# Patient Record
Sex: Female | Born: 1935 | Race: White | Hispanic: No | Smoking: Former smoker
Health system: Southern US, Community
[De-identification: ages and names within clinical notes are randomized; demographics above are authoritative.]

## PROBLEM LIST (undated history)

## (undated) DIAGNOSIS — I4891 Unspecified atrial fibrillation: Secondary | ICD-10-CM

## (undated) DIAGNOSIS — I509 Heart failure, unspecified: Secondary | ICD-10-CM

## (undated) DIAGNOSIS — E119 Type 2 diabetes mellitus without complications: Secondary | ICD-10-CM

---

## 2015-12-28 DIAGNOSIS — E1165 Type 2 diabetes mellitus with hyperglycemia: Secondary | ICD-10-CM | POA: Diagnosis not present

## 2015-12-28 DIAGNOSIS — G301 Alzheimer's disease with late onset: Secondary | ICD-10-CM | POA: Diagnosis not present

## 2015-12-28 DIAGNOSIS — K219 Gastro-esophageal reflux disease without esophagitis: Secondary | ICD-10-CM | POA: Diagnosis not present

## 2015-12-28 DIAGNOSIS — I4891 Unspecified atrial fibrillation: Secondary | ICD-10-CM | POA: Diagnosis not present

## 2016-01-29 DIAGNOSIS — I5032 Chronic diastolic (congestive) heart failure: Secondary | ICD-10-CM | POA: Diagnosis not present

## 2016-01-29 DIAGNOSIS — Z751 Person awaiting admission to adequate facility elsewhere: Secondary | ICD-10-CM | POA: Diagnosis not present

## 2016-01-29 DIAGNOSIS — Z7901 Long term (current) use of anticoagulants: Secondary | ICD-10-CM | POA: Diagnosis not present

## 2016-01-29 DIAGNOSIS — M1990 Unspecified osteoarthritis, unspecified site: Secondary | ICD-10-CM | POA: Diagnosis present

## 2016-01-29 DIAGNOSIS — I739 Peripheral vascular disease, unspecified: Secondary | ICD-10-CM | POA: Diagnosis present

## 2016-01-29 DIAGNOSIS — R069 Unspecified abnormalities of breathing: Secondary | ICD-10-CM | POA: Diagnosis not present

## 2016-01-29 DIAGNOSIS — Z7951 Long term (current) use of inhaled steroids: Secondary | ICD-10-CM | POA: Diagnosis not present

## 2016-01-29 DIAGNOSIS — I4891 Unspecified atrial fibrillation: Secondary | ICD-10-CM | POA: Diagnosis not present

## 2016-01-29 DIAGNOSIS — Z7984 Long term (current) use of oral hypoglycemic drugs: Secondary | ICD-10-CM | POA: Diagnosis not present

## 2016-01-29 DIAGNOSIS — R0602 Shortness of breath: Secondary | ICD-10-CM | POA: Diagnosis not present

## 2016-01-29 DIAGNOSIS — I110 Hypertensive heart disease with heart failure: Secondary | ICD-10-CM | POA: Diagnosis not present

## 2016-01-29 DIAGNOSIS — I480 Paroxysmal atrial fibrillation: Secondary | ICD-10-CM | POA: Diagnosis not present

## 2016-01-29 DIAGNOSIS — Z90710 Acquired absence of both cervix and uterus: Secondary | ICD-10-CM | POA: Diagnosis not present

## 2016-01-29 DIAGNOSIS — I350 Nonrheumatic aortic (valve) stenosis: Secondary | ICD-10-CM | POA: Diagnosis present

## 2016-01-29 DIAGNOSIS — N183 Chronic kidney disease, stage 3 (moderate): Secondary | ICD-10-CM | POA: Diagnosis not present

## 2016-01-29 DIAGNOSIS — I10 Essential (primary) hypertension: Secondary | ICD-10-CM | POA: Diagnosis not present

## 2016-01-29 DIAGNOSIS — I340 Nonrheumatic mitral (valve) insufficiency: Secondary | ICD-10-CM | POA: Diagnosis present

## 2016-01-29 DIAGNOSIS — E7800 Pure hypercholesterolemia, unspecified: Secondary | ICD-10-CM | POA: Diagnosis present

## 2016-01-29 DIAGNOSIS — K219 Gastro-esophageal reflux disease without esophagitis: Secondary | ICD-10-CM | POA: Diagnosis present

## 2016-01-29 DIAGNOSIS — I080 Rheumatic disorders of both mitral and aortic valves: Secondary | ICD-10-CM | POA: Diagnosis not present

## 2016-01-29 DIAGNOSIS — I482 Chronic atrial fibrillation: Secondary | ICD-10-CM | POA: Diagnosis not present

## 2016-01-29 DIAGNOSIS — F0280 Dementia in other diseases classified elsewhere without behavioral disturbance: Secondary | ICD-10-CM | POA: Diagnosis present

## 2016-01-29 DIAGNOSIS — J9602 Acute respiratory failure with hypercapnia: Secondary | ICD-10-CM | POA: Diagnosis not present

## 2016-01-29 DIAGNOSIS — G309 Alzheimer's disease, unspecified: Secondary | ICD-10-CM | POA: Diagnosis present

## 2016-01-29 DIAGNOSIS — E1165 Type 2 diabetes mellitus with hyperglycemia: Secondary | ICD-10-CM | POA: Diagnosis present

## 2016-01-29 DIAGNOSIS — J449 Chronic obstructive pulmonary disease, unspecified: Secondary | ICD-10-CM | POA: Diagnosis present

## 2016-01-29 DIAGNOSIS — I517 Cardiomegaly: Secondary | ICD-10-CM | POA: Diagnosis not present

## 2016-01-29 DIAGNOSIS — I130 Hypertensive heart and chronic kidney disease with heart failure and stage 1 through stage 4 chronic kidney disease, or unspecified chronic kidney disease: Secondary | ICD-10-CM | POA: Diagnosis present

## 2016-01-29 DIAGNOSIS — I5033 Acute on chronic diastolic (congestive) heart failure: Secondary | ICD-10-CM | POA: Diagnosis not present

## 2016-01-29 DIAGNOSIS — E1142 Type 2 diabetes mellitus with diabetic polyneuropathy: Secondary | ICD-10-CM | POA: Diagnosis not present

## 2016-01-29 DIAGNOSIS — I509 Heart failure, unspecified: Secondary | ICD-10-CM | POA: Diagnosis not present

## 2016-01-29 DIAGNOSIS — Z882 Allergy status to sulfonamides status: Secondary | ICD-10-CM | POA: Diagnosis not present

## 2016-01-29 DIAGNOSIS — Z888 Allergy status to other drugs, medicaments and biological substances status: Secondary | ICD-10-CM | POA: Diagnosis not present

## 2016-01-29 DIAGNOSIS — F320 Major depressive disorder, single episode, mild: Secondary | ICD-10-CM | POA: Diagnosis not present

## 2016-01-29 DIAGNOSIS — J9601 Acute respiratory failure with hypoxia: Secondary | ICD-10-CM | POA: Diagnosis not present

## 2016-02-05 DIAGNOSIS — Z79899 Other long term (current) drug therapy: Secondary | ICD-10-CM | POA: Diagnosis not present

## 2016-02-05 DIAGNOSIS — Z7901 Long term (current) use of anticoagulants: Secondary | ICD-10-CM | POA: Diagnosis not present

## 2016-02-05 DIAGNOSIS — I5032 Chronic diastolic (congestive) heart failure: Secondary | ICD-10-CM | POA: Diagnosis not present

## 2016-02-05 DIAGNOSIS — Z87891 Personal history of nicotine dependence: Secondary | ICD-10-CM | POA: Diagnosis not present

## 2016-02-05 DIAGNOSIS — E1142 Type 2 diabetes mellitus with diabetic polyneuropathy: Secondary | ICD-10-CM | POA: Diagnosis not present

## 2016-02-05 DIAGNOSIS — G309 Alzheimer's disease, unspecified: Secondary | ICD-10-CM | POA: Diagnosis not present

## 2016-02-05 DIAGNOSIS — F320 Major depressive disorder, single episode, mild: Secondary | ICD-10-CM | POA: Diagnosis not present

## 2016-02-05 DIAGNOSIS — F0280 Dementia in other diseases classified elsewhere without behavioral disturbance: Secondary | ICD-10-CM | POA: Diagnosis not present

## 2016-02-05 DIAGNOSIS — Z9181 History of falling: Secondary | ICD-10-CM | POA: Diagnosis not present

## 2016-02-05 DIAGNOSIS — J449 Chronic obstructive pulmonary disease, unspecified: Secondary | ICD-10-CM | POA: Diagnosis not present

## 2016-02-05 DIAGNOSIS — Z7984 Long term (current) use of oral hypoglycemic drugs: Secondary | ICD-10-CM | POA: Diagnosis not present

## 2016-02-05 DIAGNOSIS — M150 Primary generalized (osteo)arthritis: Secondary | ICD-10-CM | POA: Diagnosis not present

## 2016-02-05 DIAGNOSIS — I4891 Unspecified atrial fibrillation: Secondary | ICD-10-CM | POA: Diagnosis not present

## 2016-02-05 DIAGNOSIS — I110 Hypertensive heart disease with heart failure: Secondary | ICD-10-CM | POA: Diagnosis not present

## 2016-02-08 DIAGNOSIS — E1165 Type 2 diabetes mellitus with hyperglycemia: Secondary | ICD-10-CM | POA: Diagnosis not present

## 2016-02-08 DIAGNOSIS — K219 Gastro-esophageal reflux disease without esophagitis: Secondary | ICD-10-CM | POA: Diagnosis not present

## 2016-02-08 DIAGNOSIS — I4891 Unspecified atrial fibrillation: Secondary | ICD-10-CM | POA: Diagnosis not present

## 2016-02-08 DIAGNOSIS — G301 Alzheimer's disease with late onset: Secondary | ICD-10-CM | POA: Diagnosis not present

## 2016-02-09 DIAGNOSIS — F320 Major depressive disorder, single episode, mild: Secondary | ICD-10-CM | POA: Diagnosis not present

## 2016-02-09 DIAGNOSIS — E1142 Type 2 diabetes mellitus with diabetic polyneuropathy: Secondary | ICD-10-CM | POA: Diagnosis not present

## 2016-02-09 DIAGNOSIS — J449 Chronic obstructive pulmonary disease, unspecified: Secondary | ICD-10-CM | POA: Diagnosis not present

## 2016-02-09 DIAGNOSIS — I4891 Unspecified atrial fibrillation: Secondary | ICD-10-CM | POA: Diagnosis not present

## 2016-02-09 DIAGNOSIS — I5032 Chronic diastolic (congestive) heart failure: Secondary | ICD-10-CM | POA: Diagnosis not present

## 2016-02-09 DIAGNOSIS — I110 Hypertensive heart disease with heart failure: Secondary | ICD-10-CM | POA: Diagnosis not present

## 2016-02-12 DIAGNOSIS — I4891 Unspecified atrial fibrillation: Secondary | ICD-10-CM | POA: Diagnosis not present

## 2016-02-12 DIAGNOSIS — J449 Chronic obstructive pulmonary disease, unspecified: Secondary | ICD-10-CM | POA: Diagnosis not present

## 2016-02-12 DIAGNOSIS — E1142 Type 2 diabetes mellitus with diabetic polyneuropathy: Secondary | ICD-10-CM | POA: Diagnosis not present

## 2016-02-12 DIAGNOSIS — I110 Hypertensive heart disease with heart failure: Secondary | ICD-10-CM | POA: Diagnosis not present

## 2016-02-12 DIAGNOSIS — F320 Major depressive disorder, single episode, mild: Secondary | ICD-10-CM | POA: Diagnosis not present

## 2016-02-12 DIAGNOSIS — I5032 Chronic diastolic (congestive) heart failure: Secondary | ICD-10-CM | POA: Diagnosis not present

## 2016-02-15 DIAGNOSIS — G301 Alzheimer's disease with late onset: Secondary | ICD-10-CM | POA: Diagnosis not present

## 2016-02-15 DIAGNOSIS — K219 Gastro-esophageal reflux disease without esophagitis: Secondary | ICD-10-CM | POA: Diagnosis not present

## 2016-02-15 DIAGNOSIS — E1165 Type 2 diabetes mellitus with hyperglycemia: Secondary | ICD-10-CM | POA: Diagnosis not present

## 2016-02-15 DIAGNOSIS — F320 Major depressive disorder, single episode, mild: Secondary | ICD-10-CM | POA: Diagnosis not present

## 2016-02-15 DIAGNOSIS — R195 Other fecal abnormalities: Secondary | ICD-10-CM | POA: Diagnosis not present

## 2016-02-15 DIAGNOSIS — E1142 Type 2 diabetes mellitus with diabetic polyneuropathy: Secondary | ICD-10-CM | POA: Diagnosis not present

## 2016-02-15 DIAGNOSIS — I110 Hypertensive heart disease with heart failure: Secondary | ICD-10-CM | POA: Diagnosis not present

## 2016-02-15 DIAGNOSIS — I4891 Unspecified atrial fibrillation: Secondary | ICD-10-CM | POA: Diagnosis not present

## 2016-02-15 DIAGNOSIS — J449 Chronic obstructive pulmonary disease, unspecified: Secondary | ICD-10-CM | POA: Diagnosis not present

## 2016-02-15 DIAGNOSIS — I5032 Chronic diastolic (congestive) heart failure: Secondary | ICD-10-CM | POA: Diagnosis not present

## 2016-02-16 DIAGNOSIS — Z7901 Long term (current) use of anticoagulants: Secondary | ICD-10-CM | POA: Diagnosis not present

## 2016-02-19 DIAGNOSIS — I5032 Chronic diastolic (congestive) heart failure: Secondary | ICD-10-CM | POA: Diagnosis not present

## 2016-02-19 DIAGNOSIS — J449 Chronic obstructive pulmonary disease, unspecified: Secondary | ICD-10-CM | POA: Diagnosis not present

## 2016-02-19 DIAGNOSIS — I4891 Unspecified atrial fibrillation: Secondary | ICD-10-CM | POA: Diagnosis not present

## 2016-02-19 DIAGNOSIS — E1142 Type 2 diabetes mellitus with diabetic polyneuropathy: Secondary | ICD-10-CM | POA: Diagnosis not present

## 2016-02-19 DIAGNOSIS — F320 Major depressive disorder, single episode, mild: Secondary | ICD-10-CM | POA: Diagnosis not present

## 2016-02-19 DIAGNOSIS — I110 Hypertensive heart disease with heart failure: Secondary | ICD-10-CM | POA: Diagnosis not present

## 2016-02-26 DIAGNOSIS — J449 Chronic obstructive pulmonary disease, unspecified: Secondary | ICD-10-CM | POA: Diagnosis not present

## 2016-02-26 DIAGNOSIS — I110 Hypertensive heart disease with heart failure: Secondary | ICD-10-CM | POA: Diagnosis not present

## 2016-02-26 DIAGNOSIS — I5032 Chronic diastolic (congestive) heart failure: Secondary | ICD-10-CM | POA: Diagnosis not present

## 2016-02-26 DIAGNOSIS — I4891 Unspecified atrial fibrillation: Secondary | ICD-10-CM | POA: Diagnosis not present

## 2016-02-28 DIAGNOSIS — Z7901 Long term (current) use of anticoagulants: Secondary | ICD-10-CM | POA: Diagnosis not present

## 2016-02-29 DIAGNOSIS — E1142 Type 2 diabetes mellitus with diabetic polyneuropathy: Secondary | ICD-10-CM | POA: Diagnosis not present

## 2016-02-29 DIAGNOSIS — I110 Hypertensive heart disease with heart failure: Secondary | ICD-10-CM | POA: Diagnosis not present

## 2016-02-29 DIAGNOSIS — I5032 Chronic diastolic (congestive) heart failure: Secondary | ICD-10-CM | POA: Diagnosis not present

## 2016-02-29 DIAGNOSIS — I4891 Unspecified atrial fibrillation: Secondary | ICD-10-CM | POA: Diagnosis not present

## 2016-02-29 DIAGNOSIS — J449 Chronic obstructive pulmonary disease, unspecified: Secondary | ICD-10-CM | POA: Diagnosis not present

## 2016-02-29 DIAGNOSIS — F320 Major depressive disorder, single episode, mild: Secondary | ICD-10-CM | POA: Diagnosis not present

## 2016-03-04 DIAGNOSIS — E1142 Type 2 diabetes mellitus with diabetic polyneuropathy: Secondary | ICD-10-CM | POA: Diagnosis not present

## 2016-03-04 DIAGNOSIS — I5032 Chronic diastolic (congestive) heart failure: Secondary | ICD-10-CM | POA: Diagnosis not present

## 2016-03-04 DIAGNOSIS — I110 Hypertensive heart disease with heart failure: Secondary | ICD-10-CM | POA: Diagnosis not present

## 2016-03-04 DIAGNOSIS — J449 Chronic obstructive pulmonary disease, unspecified: Secondary | ICD-10-CM | POA: Diagnosis not present

## 2016-03-07 DIAGNOSIS — F320 Major depressive disorder, single episode, mild: Secondary | ICD-10-CM | POA: Diagnosis not present

## 2016-03-07 DIAGNOSIS — I4891 Unspecified atrial fibrillation: Secondary | ICD-10-CM | POA: Diagnosis not present

## 2016-03-07 DIAGNOSIS — I5032 Chronic diastolic (congestive) heart failure: Secondary | ICD-10-CM | POA: Diagnosis not present

## 2016-03-07 DIAGNOSIS — J449 Chronic obstructive pulmonary disease, unspecified: Secondary | ICD-10-CM | POA: Diagnosis not present

## 2016-03-07 DIAGNOSIS — E1142 Type 2 diabetes mellitus with diabetic polyneuropathy: Secondary | ICD-10-CM | POA: Diagnosis not present

## 2016-03-07 DIAGNOSIS — I110 Hypertensive heart disease with heart failure: Secondary | ICD-10-CM | POA: Diagnosis not present

## 2016-03-13 DIAGNOSIS — Z7901 Long term (current) use of anticoagulants: Secondary | ICD-10-CM | POA: Diagnosis not present

## 2016-03-13 DIAGNOSIS — N390 Urinary tract infection, site not specified: Secondary | ICD-10-CM | POA: Diagnosis not present

## 2016-03-14 DIAGNOSIS — I110 Hypertensive heart disease with heart failure: Secondary | ICD-10-CM | POA: Diagnosis not present

## 2016-03-14 DIAGNOSIS — I482 Chronic atrial fibrillation: Secondary | ICD-10-CM | POA: Diagnosis not present

## 2016-03-14 DIAGNOSIS — I350 Nonrheumatic aortic (valve) stenosis: Secondary | ICD-10-CM | POA: Diagnosis not present

## 2016-03-14 DIAGNOSIS — I5032 Chronic diastolic (congestive) heart failure: Secondary | ICD-10-CM | POA: Diagnosis not present

## 2016-03-14 DIAGNOSIS — Z7901 Long term (current) use of anticoagulants: Secondary | ICD-10-CM | POA: Diagnosis not present

## 2016-03-19 DIAGNOSIS — I110 Hypertensive heart disease with heart failure: Secondary | ICD-10-CM | POA: Diagnosis not present

## 2016-03-19 DIAGNOSIS — E1142 Type 2 diabetes mellitus with diabetic polyneuropathy: Secondary | ICD-10-CM | POA: Diagnosis not present

## 2016-03-19 DIAGNOSIS — I4891 Unspecified atrial fibrillation: Secondary | ICD-10-CM | POA: Diagnosis not present

## 2016-03-19 DIAGNOSIS — J449 Chronic obstructive pulmonary disease, unspecified: Secondary | ICD-10-CM | POA: Diagnosis not present

## 2016-03-19 DIAGNOSIS — I5032 Chronic diastolic (congestive) heart failure: Secondary | ICD-10-CM | POA: Diagnosis not present

## 2016-03-19 DIAGNOSIS — F320 Major depressive disorder, single episode, mild: Secondary | ICD-10-CM | POA: Diagnosis not present

## 2016-03-21 DIAGNOSIS — Z7901 Long term (current) use of anticoagulants: Secondary | ICD-10-CM | POA: Diagnosis not present

## 2016-03-21 DIAGNOSIS — Z79899 Other long term (current) drug therapy: Secondary | ICD-10-CM | POA: Diagnosis not present

## 2016-03-21 DIAGNOSIS — H353221 Exudative age-related macular degeneration, left eye, with active choroidal neovascularization: Secondary | ICD-10-CM | POA: Diagnosis not present

## 2016-03-29 DIAGNOSIS — Z7901 Long term (current) use of anticoagulants: Secondary | ICD-10-CM | POA: Diagnosis not present

## 2016-04-03 DIAGNOSIS — J449 Chronic obstructive pulmonary disease, unspecified: Secondary | ICD-10-CM | POA: Diagnosis not present

## 2016-04-03 DIAGNOSIS — I110 Hypertensive heart disease with heart failure: Secondary | ICD-10-CM | POA: Diagnosis not present

## 2016-04-03 DIAGNOSIS — F320 Major depressive disorder, single episode, mild: Secondary | ICD-10-CM | POA: Diagnosis not present

## 2016-04-03 DIAGNOSIS — I4891 Unspecified atrial fibrillation: Secondary | ICD-10-CM | POA: Diagnosis not present

## 2016-04-03 DIAGNOSIS — E1142 Type 2 diabetes mellitus with diabetic polyneuropathy: Secondary | ICD-10-CM | POA: Diagnosis not present

## 2016-04-03 DIAGNOSIS — I5032 Chronic diastolic (congestive) heart failure: Secondary | ICD-10-CM | POA: Diagnosis not present

## 2016-04-03 DIAGNOSIS — Z7901 Long term (current) use of anticoagulants: Secondary | ICD-10-CM | POA: Diagnosis not present

## 2016-04-09 DIAGNOSIS — R195 Other fecal abnormalities: Secondary | ICD-10-CM | POA: Diagnosis not present

## 2016-04-10 DIAGNOSIS — Z7901 Long term (current) use of anticoagulants: Secondary | ICD-10-CM | POA: Diagnosis not present

## 2016-04-15 DIAGNOSIS — I10 Essential (primary) hypertension: Secondary | ICD-10-CM | POA: Diagnosis not present

## 2016-04-15 DIAGNOSIS — Z7901 Long term (current) use of anticoagulants: Secondary | ICD-10-CM | POA: Diagnosis not present

## 2016-04-15 DIAGNOSIS — I482 Chronic atrial fibrillation: Secondary | ICD-10-CM | POA: Diagnosis not present

## 2016-04-15 DIAGNOSIS — I5032 Chronic diastolic (congestive) heart failure: Secondary | ICD-10-CM | POA: Diagnosis not present

## 2016-04-17 DIAGNOSIS — Z79899 Other long term (current) drug therapy: Secondary | ICD-10-CM | POA: Diagnosis not present

## 2016-04-17 DIAGNOSIS — Z7901 Long term (current) use of anticoagulants: Secondary | ICD-10-CM | POA: Diagnosis not present

## 2016-04-22 DIAGNOSIS — Z7901 Long term (current) use of anticoagulants: Secondary | ICD-10-CM | POA: Diagnosis not present

## 2016-04-25 DIAGNOSIS — H353221 Exudative age-related macular degeneration, left eye, with active choroidal neovascularization: Secondary | ICD-10-CM | POA: Diagnosis not present

## 2016-05-01 DIAGNOSIS — Z7901 Long term (current) use of anticoagulants: Secondary | ICD-10-CM | POA: Diagnosis not present

## 2016-05-06 DIAGNOSIS — H3530 Unspecified macular degeneration: Secondary | ICD-10-CM | POA: Diagnosis not present

## 2016-05-06 DIAGNOSIS — F331 Major depressive disorder, recurrent, moderate: Secondary | ICD-10-CM | POA: Diagnosis not present

## 2016-05-10 DIAGNOSIS — J690 Pneumonitis due to inhalation of food and vomit: Secondary | ICD-10-CM | POA: Diagnosis not present

## 2016-05-10 DIAGNOSIS — R0781 Pleurodynia: Secondary | ICD-10-CM | POA: Diagnosis not present

## 2016-05-10 DIAGNOSIS — S299XXA Unspecified injury of thorax, initial encounter: Secondary | ICD-10-CM | POA: Diagnosis not present

## 2016-05-10 DIAGNOSIS — S2232XA Fracture of one rib, left side, initial encounter for closed fracture: Secondary | ICD-10-CM | POA: Diagnosis not present

## 2016-05-10 DIAGNOSIS — Z7901 Long term (current) use of anticoagulants: Secondary | ICD-10-CM | POA: Diagnosis not present

## 2016-05-10 DIAGNOSIS — R42 Dizziness and giddiness: Secondary | ICD-10-CM | POA: Diagnosis not present

## 2016-05-10 DIAGNOSIS — R319 Hematuria, unspecified: Secondary | ICD-10-CM | POA: Diagnosis not present

## 2016-05-10 DIAGNOSIS — R079 Chest pain, unspecified: Secondary | ICD-10-CM | POA: Diagnosis not present

## 2016-05-10 DIAGNOSIS — S3991XA Unspecified injury of abdomen, initial encounter: Secondary | ICD-10-CM | POA: Diagnosis not present

## 2016-05-10 DIAGNOSIS — M546 Pain in thoracic spine: Secondary | ICD-10-CM | POA: Diagnosis not present

## 2016-05-10 DIAGNOSIS — R259 Unspecified abnormal involuntary movements: Secondary | ICD-10-CM | POA: Diagnosis not present

## 2016-05-11 DIAGNOSIS — S299XXA Unspecified injury of thorax, initial encounter: Secondary | ICD-10-CM | POA: Diagnosis not present

## 2016-05-11 DIAGNOSIS — I130 Hypertensive heart and chronic kidney disease with heart failure and stage 1 through stage 4 chronic kidney disease, or unspecified chronic kidney disease: Secondary | ICD-10-CM | POA: Diagnosis present

## 2016-05-11 DIAGNOSIS — M546 Pain in thoracic spine: Secondary | ICD-10-CM | POA: Diagnosis not present

## 2016-05-11 DIAGNOSIS — I4891 Unspecified atrial fibrillation: Secondary | ICD-10-CM | POA: Diagnosis not present

## 2016-05-11 DIAGNOSIS — E1122 Type 2 diabetes mellitus with diabetic chronic kidney disease: Secondary | ICD-10-CM | POA: Diagnosis present

## 2016-05-11 DIAGNOSIS — J440 Chronic obstructive pulmonary disease with acute lower respiratory infection: Secondary | ICD-10-CM | POA: Diagnosis present

## 2016-05-11 DIAGNOSIS — N183 Chronic kidney disease, stage 3 (moderate): Secondary | ICD-10-CM | POA: Diagnosis present

## 2016-05-11 DIAGNOSIS — R0781 Pleurodynia: Secondary | ICD-10-CM | POA: Diagnosis not present

## 2016-05-11 DIAGNOSIS — R079 Chest pain, unspecified: Secondary | ICD-10-CM | POA: Diagnosis not present

## 2016-05-11 DIAGNOSIS — J189 Pneumonia, unspecified organism: Secondary | ICD-10-CM | POA: Diagnosis present

## 2016-05-11 DIAGNOSIS — E1165 Type 2 diabetes mellitus with hyperglycemia: Secondary | ICD-10-CM | POA: Diagnosis present

## 2016-05-11 DIAGNOSIS — K219 Gastro-esophageal reflux disease without esophagitis: Secondary | ICD-10-CM | POA: Diagnosis present

## 2016-05-11 DIAGNOSIS — Z87891 Personal history of nicotine dependence: Secondary | ICD-10-CM | POA: Diagnosis not present

## 2016-05-11 DIAGNOSIS — R42 Dizziness and giddiness: Secondary | ICD-10-CM | POA: Diagnosis not present

## 2016-05-11 DIAGNOSIS — E1142 Type 2 diabetes mellitus with diabetic polyneuropathy: Secondary | ICD-10-CM | POA: Diagnosis present

## 2016-05-11 DIAGNOSIS — Z79899 Other long term (current) drug therapy: Secondary | ICD-10-CM | POA: Diagnosis not present

## 2016-05-11 DIAGNOSIS — F0390 Unspecified dementia without behavioral disturbance: Secondary | ICD-10-CM | POA: Diagnosis present

## 2016-05-11 DIAGNOSIS — I509 Heart failure, unspecified: Secondary | ICD-10-CM | POA: Diagnosis present

## 2016-05-11 DIAGNOSIS — Z7984 Long term (current) use of oral hypoglycemic drugs: Secondary | ICD-10-CM | POA: Diagnosis not present

## 2016-05-11 DIAGNOSIS — S3991XA Unspecified injury of abdomen, initial encounter: Secondary | ICD-10-CM | POA: Diagnosis not present

## 2016-05-11 DIAGNOSIS — R197 Diarrhea, unspecified: Secondary | ICD-10-CM | POA: Diagnosis present

## 2016-05-11 DIAGNOSIS — S2232XA Fracture of one rib, left side, initial encounter for closed fracture: Secondary | ICD-10-CM | POA: Diagnosis not present

## 2016-05-11 DIAGNOSIS — Z5329 Procedure and treatment not carried out because of patient's decision for other reasons: Secondary | ICD-10-CM | POA: Diagnosis present

## 2016-05-11 DIAGNOSIS — R319 Hematuria, unspecified: Secondary | ICD-10-CM | POA: Diagnosis not present

## 2016-05-11 DIAGNOSIS — Z7901 Long term (current) use of anticoagulants: Secondary | ICD-10-CM | POA: Diagnosis not present

## 2016-05-11 DIAGNOSIS — J690 Pneumonitis due to inhalation of food and vomit: Secondary | ICD-10-CM | POA: Diagnosis not present

## 2016-05-21 DIAGNOSIS — Z7901 Long term (current) use of anticoagulants: Secondary | ICD-10-CM | POA: Diagnosis not present

## 2016-05-24 DIAGNOSIS — R260 Ataxic gait: Secondary | ICD-10-CM | POA: Diagnosis not present

## 2016-05-24 DIAGNOSIS — F0280 Dementia in other diseases classified elsewhere without behavioral disturbance: Secondary | ICD-10-CM | POA: Diagnosis not present

## 2016-05-24 DIAGNOSIS — Z7901 Long term (current) use of anticoagulants: Secondary | ICD-10-CM | POA: Diagnosis not present

## 2016-05-24 DIAGNOSIS — I130 Hypertensive heart and chronic kidney disease with heart failure and stage 1 through stage 4 chronic kidney disease, or unspecified chronic kidney disease: Secondary | ICD-10-CM | POA: Diagnosis not present

## 2016-05-24 DIAGNOSIS — Z7984 Long term (current) use of oral hypoglycemic drugs: Secondary | ICD-10-CM | POA: Diagnosis not present

## 2016-05-24 DIAGNOSIS — N183 Chronic kidney disease, stage 3 (moderate): Secondary | ICD-10-CM | POA: Diagnosis not present

## 2016-05-24 DIAGNOSIS — E119 Type 2 diabetes mellitus without complications: Secondary | ICD-10-CM | POA: Diagnosis not present

## 2016-05-24 DIAGNOSIS — M1990 Unspecified osteoarthritis, unspecified site: Secondary | ICD-10-CM | POA: Diagnosis not present

## 2016-05-24 DIAGNOSIS — I509 Heart failure, unspecified: Secondary | ICD-10-CM | POA: Diagnosis not present

## 2016-05-24 DIAGNOSIS — Z8701 Personal history of pneumonia (recurrent): Secondary | ICD-10-CM | POA: Diagnosis not present

## 2016-05-24 DIAGNOSIS — I4891 Unspecified atrial fibrillation: Secondary | ICD-10-CM | POA: Diagnosis not present

## 2016-05-24 DIAGNOSIS — G309 Alzheimer's disease, unspecified: Secondary | ICD-10-CM | POA: Diagnosis not present

## 2016-05-24 DIAGNOSIS — Z9181 History of falling: Secondary | ICD-10-CM | POA: Diagnosis not present

## 2016-05-24 DIAGNOSIS — S2232XD Fracture of one rib, left side, subsequent encounter for fracture with routine healing: Secondary | ICD-10-CM | POA: Diagnosis not present

## 2016-05-24 DIAGNOSIS — D649 Anemia, unspecified: Secondary | ICD-10-CM | POA: Diagnosis not present

## 2016-05-30 DIAGNOSIS — I130 Hypertensive heart and chronic kidney disease with heart failure and stage 1 through stage 4 chronic kidney disease, or unspecified chronic kidney disease: Secondary | ICD-10-CM | POA: Diagnosis not present

## 2016-05-30 DIAGNOSIS — E119 Type 2 diabetes mellitus without complications: Secondary | ICD-10-CM | POA: Diagnosis not present

## 2016-05-30 DIAGNOSIS — I509 Heart failure, unspecified: Secondary | ICD-10-CM | POA: Diagnosis not present

## 2016-05-30 DIAGNOSIS — S2232XD Fracture of one rib, left side, subsequent encounter for fracture with routine healing: Secondary | ICD-10-CM | POA: Diagnosis not present

## 2016-05-30 DIAGNOSIS — M1990 Unspecified osteoarthritis, unspecified site: Secondary | ICD-10-CM | POA: Diagnosis not present

## 2016-05-30 DIAGNOSIS — R260 Ataxic gait: Secondary | ICD-10-CM | POA: Diagnosis not present

## 2016-05-31 DIAGNOSIS — M1990 Unspecified osteoarthritis, unspecified site: Secondary | ICD-10-CM | POA: Diagnosis not present

## 2016-05-31 DIAGNOSIS — I509 Heart failure, unspecified: Secondary | ICD-10-CM | POA: Diagnosis not present

## 2016-05-31 DIAGNOSIS — E119 Type 2 diabetes mellitus without complications: Secondary | ICD-10-CM | POA: Diagnosis not present

## 2016-05-31 DIAGNOSIS — I130 Hypertensive heart and chronic kidney disease with heart failure and stage 1 through stage 4 chronic kidney disease, or unspecified chronic kidney disease: Secondary | ICD-10-CM | POA: Diagnosis not present

## 2016-05-31 DIAGNOSIS — S2232XD Fracture of one rib, left side, subsequent encounter for fracture with routine healing: Secondary | ICD-10-CM | POA: Diagnosis not present

## 2016-05-31 DIAGNOSIS — R260 Ataxic gait: Secondary | ICD-10-CM | POA: Diagnosis not present

## 2016-06-03 DIAGNOSIS — I509 Heart failure, unspecified: Secondary | ICD-10-CM | POA: Diagnosis not present

## 2016-06-03 DIAGNOSIS — R260 Ataxic gait: Secondary | ICD-10-CM | POA: Diagnosis not present

## 2016-06-03 DIAGNOSIS — Z7901 Long term (current) use of anticoagulants: Secondary | ICD-10-CM | POA: Diagnosis not present

## 2016-06-03 DIAGNOSIS — E119 Type 2 diabetes mellitus without complications: Secondary | ICD-10-CM | POA: Diagnosis not present

## 2016-06-03 DIAGNOSIS — I130 Hypertensive heart and chronic kidney disease with heart failure and stage 1 through stage 4 chronic kidney disease, or unspecified chronic kidney disease: Secondary | ICD-10-CM | POA: Diagnosis not present

## 2016-06-03 DIAGNOSIS — M1990 Unspecified osteoarthritis, unspecified site: Secondary | ICD-10-CM | POA: Diagnosis not present

## 2016-06-03 DIAGNOSIS — S2232XD Fracture of one rib, left side, subsequent encounter for fracture with routine healing: Secondary | ICD-10-CM | POA: Diagnosis not present

## 2016-06-05 DIAGNOSIS — S2232XD Fracture of one rib, left side, subsequent encounter for fracture with routine healing: Secondary | ICD-10-CM | POA: Diagnosis not present

## 2016-06-05 DIAGNOSIS — E119 Type 2 diabetes mellitus without complications: Secondary | ICD-10-CM | POA: Diagnosis not present

## 2016-06-05 DIAGNOSIS — R260 Ataxic gait: Secondary | ICD-10-CM | POA: Diagnosis not present

## 2016-06-06 DIAGNOSIS — S2232XD Fracture of one rib, left side, subsequent encounter for fracture with routine healing: Secondary | ICD-10-CM | POA: Diagnosis not present

## 2016-06-06 DIAGNOSIS — E119 Type 2 diabetes mellitus without complications: Secondary | ICD-10-CM | POA: Diagnosis not present

## 2016-06-06 DIAGNOSIS — I130 Hypertensive heart and chronic kidney disease with heart failure and stage 1 through stage 4 chronic kidney disease, or unspecified chronic kidney disease: Secondary | ICD-10-CM | POA: Diagnosis not present

## 2016-06-06 DIAGNOSIS — R260 Ataxic gait: Secondary | ICD-10-CM | POA: Diagnosis not present

## 2016-06-06 DIAGNOSIS — I509 Heart failure, unspecified: Secondary | ICD-10-CM | POA: Diagnosis not present

## 2016-06-06 DIAGNOSIS — M1990 Unspecified osteoarthritis, unspecified site: Secondary | ICD-10-CM | POA: Diagnosis not present

## 2016-06-10 DIAGNOSIS — I509 Heart failure, unspecified: Secondary | ICD-10-CM | POA: Diagnosis not present

## 2016-06-10 DIAGNOSIS — R260 Ataxic gait: Secondary | ICD-10-CM | POA: Diagnosis not present

## 2016-06-10 DIAGNOSIS — I130 Hypertensive heart and chronic kidney disease with heart failure and stage 1 through stage 4 chronic kidney disease, or unspecified chronic kidney disease: Secondary | ICD-10-CM | POA: Diagnosis not present

## 2016-06-10 DIAGNOSIS — S2232XD Fracture of one rib, left side, subsequent encounter for fracture with routine healing: Secondary | ICD-10-CM | POA: Diagnosis not present

## 2016-06-10 DIAGNOSIS — E119 Type 2 diabetes mellitus without complications: Secondary | ICD-10-CM | POA: Diagnosis not present

## 2016-06-10 DIAGNOSIS — Z7901 Long term (current) use of anticoagulants: Secondary | ICD-10-CM | POA: Diagnosis not present

## 2016-06-10 DIAGNOSIS — M1990 Unspecified osteoarthritis, unspecified site: Secondary | ICD-10-CM | POA: Diagnosis not present

## 2016-06-12 DIAGNOSIS — M1990 Unspecified osteoarthritis, unspecified site: Secondary | ICD-10-CM | POA: Diagnosis not present

## 2016-06-12 DIAGNOSIS — I509 Heart failure, unspecified: Secondary | ICD-10-CM | POA: Diagnosis not present

## 2016-06-12 DIAGNOSIS — R260 Ataxic gait: Secondary | ICD-10-CM | POA: Diagnosis not present

## 2016-06-12 DIAGNOSIS — E119 Type 2 diabetes mellitus without complications: Secondary | ICD-10-CM | POA: Diagnosis not present

## 2016-06-12 DIAGNOSIS — I130 Hypertensive heart and chronic kidney disease with heart failure and stage 1 through stage 4 chronic kidney disease, or unspecified chronic kidney disease: Secondary | ICD-10-CM | POA: Diagnosis not present

## 2016-06-12 DIAGNOSIS — S2232XD Fracture of one rib, left side, subsequent encounter for fracture with routine healing: Secondary | ICD-10-CM | POA: Diagnosis not present

## 2016-06-18 DIAGNOSIS — Z7901 Long term (current) use of anticoagulants: Secondary | ICD-10-CM | POA: Diagnosis not present

## 2016-06-18 DIAGNOSIS — K219 Gastro-esophageal reflux disease without esophagitis: Secondary | ICD-10-CM | POA: Diagnosis not present

## 2016-06-18 DIAGNOSIS — I482 Chronic atrial fibrillation: Secondary | ICD-10-CM | POA: Diagnosis not present

## 2016-06-18 DIAGNOSIS — I5032 Chronic diastolic (congestive) heart failure: Secondary | ICD-10-CM | POA: Diagnosis not present

## 2016-06-18 DIAGNOSIS — E1142 Type 2 diabetes mellitus with diabetic polyneuropathy: Secondary | ICD-10-CM | POA: Diagnosis not present

## 2016-06-19 DIAGNOSIS — I5032 Chronic diastolic (congestive) heart failure: Secondary | ICD-10-CM | POA: Diagnosis not present

## 2016-06-19 DIAGNOSIS — Z7901 Long term (current) use of anticoagulants: Secondary | ICD-10-CM | POA: Diagnosis not present

## 2016-06-19 DIAGNOSIS — I110 Hypertensive heart disease with heart failure: Secondary | ICD-10-CM | POA: Diagnosis not present

## 2016-06-19 DIAGNOSIS — I482 Chronic atrial fibrillation: Secondary | ICD-10-CM | POA: Diagnosis not present

## 2016-06-20 DIAGNOSIS — H353221 Exudative age-related macular degeneration, left eye, with active choroidal neovascularization: Secondary | ICD-10-CM | POA: Diagnosis not present

## 2016-06-24 DIAGNOSIS — Z7901 Long term (current) use of anticoagulants: Secondary | ICD-10-CM | POA: Diagnosis not present

## 2016-06-27 DIAGNOSIS — Z7901 Long term (current) use of anticoagulants: Secondary | ICD-10-CM | POA: Diagnosis not present

## 2016-07-04 DIAGNOSIS — Z7901 Long term (current) use of anticoagulants: Secondary | ICD-10-CM | POA: Diagnosis not present

## 2016-07-15 DIAGNOSIS — Z7901 Long term (current) use of anticoagulants: Secondary | ICD-10-CM | POA: Diagnosis not present

## 2016-07-18 DIAGNOSIS — Z7901 Long term (current) use of anticoagulants: Secondary | ICD-10-CM | POA: Diagnosis not present

## 2016-07-19 DIAGNOSIS — Z79899 Other long term (current) drug therapy: Secondary | ICD-10-CM | POA: Diagnosis not present

## 2016-07-19 DIAGNOSIS — E119 Type 2 diabetes mellitus without complications: Secondary | ICD-10-CM | POA: Diagnosis not present

## 2016-07-19 DIAGNOSIS — D518 Other vitamin B12 deficiency anemias: Secondary | ICD-10-CM | POA: Diagnosis not present

## 2016-07-19 DIAGNOSIS — E782 Mixed hyperlipidemia: Secondary | ICD-10-CM | POA: Diagnosis not present

## 2016-07-19 DIAGNOSIS — E038 Other specified hypothyroidism: Secondary | ICD-10-CM | POA: Diagnosis not present

## 2016-07-19 DIAGNOSIS — E559 Vitamin D deficiency, unspecified: Secondary | ICD-10-CM | POA: Diagnosis not present

## 2016-07-22 DIAGNOSIS — I10 Essential (primary) hypertension: Secondary | ICD-10-CM | POA: Diagnosis not present

## 2016-07-22 DIAGNOSIS — E784 Other hyperlipidemia: Secondary | ICD-10-CM | POA: Diagnosis not present

## 2016-07-22 DIAGNOSIS — G309 Alzheimer's disease, unspecified: Secondary | ICD-10-CM | POA: Diagnosis not present

## 2016-07-22 DIAGNOSIS — M150 Primary generalized (osteo)arthritis: Secondary | ICD-10-CM | POA: Diagnosis not present

## 2016-07-22 DIAGNOSIS — Z7901 Long term (current) use of anticoagulants: Secondary | ICD-10-CM | POA: Diagnosis not present

## 2016-07-26 DIAGNOSIS — Z7901 Long term (current) use of anticoagulants: Secondary | ICD-10-CM | POA: Diagnosis not present

## 2016-08-02 DIAGNOSIS — Z79899 Other long term (current) drug therapy: Secondary | ICD-10-CM | POA: Diagnosis not present

## 2016-08-02 DIAGNOSIS — Z7901 Long term (current) use of anticoagulants: Secondary | ICD-10-CM | POA: Diagnosis not present

## 2016-08-05 DIAGNOSIS — Z7901 Long term (current) use of anticoagulants: Secondary | ICD-10-CM | POA: Diagnosis not present

## 2016-08-09 DIAGNOSIS — Z7901 Long term (current) use of anticoagulants: Secondary | ICD-10-CM | POA: Diagnosis not present

## 2016-08-15 DIAGNOSIS — Z7901 Long term (current) use of anticoagulants: Secondary | ICD-10-CM | POA: Diagnosis not present

## 2016-08-23 DIAGNOSIS — Z7901 Long term (current) use of anticoagulants: Secondary | ICD-10-CM | POA: Diagnosis not present

## 2016-08-30 DIAGNOSIS — Z7901 Long term (current) use of anticoagulants: Secondary | ICD-10-CM | POA: Diagnosis not present

## 2016-09-02 DIAGNOSIS — E1140 Type 2 diabetes mellitus with diabetic neuropathy, unspecified: Secondary | ICD-10-CM | POA: Diagnosis not present

## 2016-09-02 DIAGNOSIS — G301 Alzheimer's disease with late onset: Secondary | ICD-10-CM | POA: Diagnosis not present

## 2016-09-02 DIAGNOSIS — I482 Chronic atrial fibrillation: Secondary | ICD-10-CM | POA: Diagnosis not present

## 2016-09-02 DIAGNOSIS — I10 Essential (primary) hypertension: Secondary | ICD-10-CM | POA: Diagnosis not present

## 2016-09-05 DIAGNOSIS — H353222 Exudative age-related macular degeneration, left eye, with inactive choroidal neovascularization: Secondary | ICD-10-CM | POA: Diagnosis not present

## 2016-09-06 DIAGNOSIS — Z7901 Long term (current) use of anticoagulants: Secondary | ICD-10-CM | POA: Diagnosis not present

## 2016-09-13 DIAGNOSIS — Z7901 Long term (current) use of anticoagulants: Secondary | ICD-10-CM | POA: Diagnosis not present

## 2016-09-20 DIAGNOSIS — Z7901 Long term (current) use of anticoagulants: Secondary | ICD-10-CM | POA: Diagnosis not present

## 2016-09-24 DIAGNOSIS — M546 Pain in thoracic spine: Secondary | ICD-10-CM | POA: Diagnosis not present

## 2016-09-24 DIAGNOSIS — S299XXA Unspecified injury of thorax, initial encounter: Secondary | ICD-10-CM | POA: Diagnosis not present

## 2016-09-24 DIAGNOSIS — S0990XA Unspecified injury of head, initial encounter: Secondary | ICD-10-CM | POA: Diagnosis not present

## 2016-09-24 DIAGNOSIS — G4489 Other headache syndrome: Secondary | ICD-10-CM | POA: Diagnosis not present

## 2016-09-24 DIAGNOSIS — S20221A Contusion of right back wall of thorax, initial encounter: Secondary | ICD-10-CM | POA: Diagnosis not present

## 2016-09-26 DIAGNOSIS — I509 Heart failure, unspecified: Secondary | ICD-10-CM | POA: Diagnosis not present

## 2016-09-26 DIAGNOSIS — G308 Other Alzheimer's disease: Secondary | ICD-10-CM | POA: Diagnosis not present

## 2016-09-26 DIAGNOSIS — F0390 Unspecified dementia without behavioral disturbance: Secondary | ICD-10-CM | POA: Diagnosis not present

## 2016-09-26 DIAGNOSIS — N183 Chronic kidney disease, stage 3 (moderate): Secondary | ICD-10-CM | POA: Diagnosis not present

## 2016-09-26 DIAGNOSIS — I482 Chronic atrial fibrillation: Secondary | ICD-10-CM | POA: Diagnosis not present

## 2016-09-26 DIAGNOSIS — F0280 Dementia in other diseases classified elsewhere without behavioral disturbance: Secondary | ICD-10-CM | POA: Diagnosis not present

## 2016-09-26 DIAGNOSIS — G309 Alzheimer's disease, unspecified: Secondary | ICD-10-CM | POA: Diagnosis not present

## 2016-09-26 DIAGNOSIS — I10 Essential (primary) hypertension: Secondary | ICD-10-CM | POA: Diagnosis not present

## 2016-09-27 DIAGNOSIS — Z7901 Long term (current) use of anticoagulants: Secondary | ICD-10-CM | POA: Diagnosis not present

## 2016-10-01 DIAGNOSIS — G308 Other Alzheimer's disease: Secondary | ICD-10-CM | POA: Diagnosis not present

## 2016-10-01 DIAGNOSIS — F0280 Dementia in other diseases classified elsewhere without behavioral disturbance: Secondary | ICD-10-CM | POA: Diagnosis not present

## 2016-10-01 DIAGNOSIS — F0390 Unspecified dementia without behavioral disturbance: Secondary | ICD-10-CM | POA: Diagnosis not present

## 2016-10-01 DIAGNOSIS — G309 Alzheimer's disease, unspecified: Secondary | ICD-10-CM | POA: Diagnosis not present

## 2016-10-01 DIAGNOSIS — Z23 Encounter for immunization: Secondary | ICD-10-CM | POA: Diagnosis not present

## 2016-10-10 DIAGNOSIS — G301 Alzheimer's disease with late onset: Secondary | ICD-10-CM | POA: Diagnosis not present

## 2016-10-10 DIAGNOSIS — G309 Alzheimer's disease, unspecified: Secondary | ICD-10-CM | POA: Diagnosis not present

## 2016-10-10 DIAGNOSIS — F0390 Unspecified dementia without behavioral disturbance: Secondary | ICD-10-CM | POA: Diagnosis not present

## 2016-10-10 DIAGNOSIS — G308 Other Alzheimer's disease: Secondary | ICD-10-CM | POA: Diagnosis not present

## 2016-10-11 DIAGNOSIS — Z7901 Long term (current) use of anticoagulants: Secondary | ICD-10-CM | POA: Diagnosis not present

## 2016-10-14 DIAGNOSIS — E784 Other hyperlipidemia: Secondary | ICD-10-CM | POA: Diagnosis not present

## 2016-10-14 DIAGNOSIS — I482 Chronic atrial fibrillation: Secondary | ICD-10-CM | POA: Diagnosis not present

## 2016-10-14 DIAGNOSIS — I5032 Chronic diastolic (congestive) heart failure: Secondary | ICD-10-CM | POA: Diagnosis not present

## 2016-10-14 DIAGNOSIS — G309 Alzheimer's disease, unspecified: Secondary | ICD-10-CM | POA: Diagnosis not present

## 2016-10-16 DIAGNOSIS — Z7901 Long term (current) use of anticoagulants: Secondary | ICD-10-CM | POA: Diagnosis not present

## 2016-10-17 DIAGNOSIS — F0390 Unspecified dementia without behavioral disturbance: Secondary | ICD-10-CM | POA: Diagnosis not present

## 2016-10-17 DIAGNOSIS — G309 Alzheimer's disease, unspecified: Secondary | ICD-10-CM | POA: Diagnosis not present

## 2016-10-17 DIAGNOSIS — G308 Other Alzheimer's disease: Secondary | ICD-10-CM | POA: Diagnosis not present

## 2016-10-17 DIAGNOSIS — F0280 Dementia in other diseases classified elsewhere without behavioral disturbance: Secondary | ICD-10-CM | POA: Diagnosis not present

## 2016-10-23 DIAGNOSIS — E559 Vitamin D deficiency, unspecified: Secondary | ICD-10-CM | POA: Diagnosis not present

## 2016-10-23 DIAGNOSIS — D518 Other vitamin B12 deficiency anemias: Secondary | ICD-10-CM | POA: Diagnosis not present

## 2016-10-23 DIAGNOSIS — Z7901 Long term (current) use of anticoagulants: Secondary | ICD-10-CM | POA: Diagnosis not present

## 2016-10-23 DIAGNOSIS — E119 Type 2 diabetes mellitus without complications: Secondary | ICD-10-CM | POA: Diagnosis not present

## 2016-10-23 DIAGNOSIS — E038 Other specified hypothyroidism: Secondary | ICD-10-CM | POA: Diagnosis not present

## 2016-10-23 DIAGNOSIS — Z79899 Other long term (current) drug therapy: Secondary | ICD-10-CM | POA: Diagnosis not present

## 2016-10-23 DIAGNOSIS — E782 Mixed hyperlipidemia: Secondary | ICD-10-CM | POA: Diagnosis not present

## 2016-10-24 DIAGNOSIS — G308 Other Alzheimer's disease: Secondary | ICD-10-CM | POA: Diagnosis not present

## 2016-10-24 DIAGNOSIS — F331 Major depressive disorder, recurrent, moderate: Secondary | ICD-10-CM | POA: Diagnosis not present

## 2016-10-24 DIAGNOSIS — F0390 Unspecified dementia without behavioral disturbance: Secondary | ICD-10-CM | POA: Diagnosis not present

## 2016-10-24 DIAGNOSIS — G309 Alzheimer's disease, unspecified: Secondary | ICD-10-CM | POA: Diagnosis not present

## 2016-10-30 DIAGNOSIS — Z7901 Long term (current) use of anticoagulants: Secondary | ICD-10-CM | POA: Diagnosis not present

## 2016-10-31 DIAGNOSIS — G309 Alzheimer's disease, unspecified: Secondary | ICD-10-CM | POA: Diagnosis not present

## 2016-10-31 DIAGNOSIS — F0390 Unspecified dementia without behavioral disturbance: Secondary | ICD-10-CM | POA: Diagnosis not present

## 2016-10-31 DIAGNOSIS — G301 Alzheimer's disease with late onset: Secondary | ICD-10-CM | POA: Diagnosis not present

## 2016-10-31 DIAGNOSIS — G308 Other Alzheimer's disease: Secondary | ICD-10-CM | POA: Diagnosis not present

## 2016-11-01 DIAGNOSIS — Z7901 Long term (current) use of anticoagulants: Secondary | ICD-10-CM | POA: Diagnosis not present

## 2016-11-04 DIAGNOSIS — E1140 Type 2 diabetes mellitus with diabetic neuropathy, unspecified: Secondary | ICD-10-CM | POA: Diagnosis not present

## 2016-11-04 DIAGNOSIS — Z7901 Long term (current) use of anticoagulants: Secondary | ICD-10-CM | POA: Diagnosis not present

## 2016-11-04 DIAGNOSIS — Z79899 Other long term (current) drug therapy: Secondary | ICD-10-CM | POA: Diagnosis not present

## 2016-11-06 DIAGNOSIS — H353222 Exudative age-related macular degeneration, left eye, with inactive choroidal neovascularization: Secondary | ICD-10-CM | POA: Diagnosis not present

## 2016-11-06 DIAGNOSIS — Z7901 Long term (current) use of anticoagulants: Secondary | ICD-10-CM | POA: Diagnosis not present

## 2016-11-08 DIAGNOSIS — F0280 Dementia in other diseases classified elsewhere without behavioral disturbance: Secondary | ICD-10-CM | POA: Diagnosis not present

## 2016-11-08 DIAGNOSIS — G308 Other Alzheimer's disease: Secondary | ICD-10-CM | POA: Diagnosis not present

## 2016-11-08 DIAGNOSIS — F0390 Unspecified dementia without behavioral disturbance: Secondary | ICD-10-CM | POA: Diagnosis not present

## 2016-11-08 DIAGNOSIS — G309 Alzheimer's disease, unspecified: Secondary | ICD-10-CM | POA: Diagnosis not present

## 2016-11-11 DIAGNOSIS — K219 Gastro-esophageal reflux disease without esophagitis: Secondary | ICD-10-CM | POA: Diagnosis not present

## 2016-11-11 DIAGNOSIS — I10 Essential (primary) hypertension: Secondary | ICD-10-CM | POA: Diagnosis not present

## 2016-11-11 DIAGNOSIS — E784 Other hyperlipidemia: Secondary | ICD-10-CM | POA: Diagnosis not present

## 2016-11-11 DIAGNOSIS — G309 Alzheimer's disease, unspecified: Secondary | ICD-10-CM | POA: Diagnosis not present

## 2016-11-13 DIAGNOSIS — F331 Major depressive disorder, recurrent, moderate: Secondary | ICD-10-CM | POA: Diagnosis not present

## 2016-11-13 DIAGNOSIS — F0390 Unspecified dementia without behavioral disturbance: Secondary | ICD-10-CM | POA: Diagnosis not present

## 2016-11-13 DIAGNOSIS — Z7901 Long term (current) use of anticoagulants: Secondary | ICD-10-CM | POA: Diagnosis not present

## 2016-11-13 DIAGNOSIS — F0280 Dementia in other diseases classified elsewhere without behavioral disturbance: Secondary | ICD-10-CM | POA: Diagnosis not present

## 2016-11-13 DIAGNOSIS — G309 Alzheimer's disease, unspecified: Secondary | ICD-10-CM | POA: Diagnosis not present

## 2016-11-18 DIAGNOSIS — Z7901 Long term (current) use of anticoagulants: Secondary | ICD-10-CM | POA: Diagnosis not present

## 2016-11-20 DIAGNOSIS — Z7901 Long term (current) use of anticoagulants: Secondary | ICD-10-CM | POA: Diagnosis not present

## 2016-11-27 DIAGNOSIS — Z7901 Long term (current) use of anticoagulants: Secondary | ICD-10-CM | POA: Diagnosis not present

## 2016-11-29 DIAGNOSIS — Z7901 Long term (current) use of anticoagulants: Secondary | ICD-10-CM | POA: Diagnosis not present

## 2016-12-03 DIAGNOSIS — A481 Legionnaires' disease: Secondary | ICD-10-CM | POA: Diagnosis not present

## 2016-12-04 DIAGNOSIS — G629 Polyneuropathy, unspecified: Secondary | ICD-10-CM | POA: Diagnosis not present

## 2016-12-04 DIAGNOSIS — Z7901 Long term (current) use of anticoagulants: Secondary | ICD-10-CM | POA: Diagnosis not present

## 2016-12-04 DIAGNOSIS — L602 Onychogryphosis: Secondary | ICD-10-CM | POA: Diagnosis not present

## 2016-12-04 DIAGNOSIS — M2041 Other hammer toe(s) (acquired), right foot: Secondary | ICD-10-CM | POA: Diagnosis not present

## 2016-12-05 DIAGNOSIS — H6123 Impacted cerumen, bilateral: Secondary | ICD-10-CM | POA: Diagnosis not present

## 2016-12-09 DIAGNOSIS — I482 Chronic atrial fibrillation: Secondary | ICD-10-CM | POA: Diagnosis not present

## 2016-12-09 DIAGNOSIS — I10 Essential (primary) hypertension: Secondary | ICD-10-CM | POA: Diagnosis not present

## 2016-12-09 DIAGNOSIS — G301 Alzheimer's disease with late onset: Secondary | ICD-10-CM | POA: Diagnosis not present

## 2016-12-09 DIAGNOSIS — I5032 Chronic diastolic (congestive) heart failure: Secondary | ICD-10-CM | POA: Diagnosis not present

## 2016-12-11 DIAGNOSIS — Z7901 Long term (current) use of anticoagulants: Secondary | ICD-10-CM | POA: Diagnosis not present

## 2016-12-18 DIAGNOSIS — Z7901 Long term (current) use of anticoagulants: Secondary | ICD-10-CM | POA: Diagnosis not present

## 2016-12-19 DIAGNOSIS — E119 Type 2 diabetes mellitus without complications: Secondary | ICD-10-CM | POA: Diagnosis not present

## 2016-12-19 DIAGNOSIS — J9610 Chronic respiratory failure, unspecified whether with hypoxia or hypercapnia: Secondary | ICD-10-CM | POA: Diagnosis not present

## 2016-12-19 DIAGNOSIS — I4891 Unspecified atrial fibrillation: Secondary | ICD-10-CM | POA: Diagnosis not present

## 2016-12-19 DIAGNOSIS — F0390 Unspecified dementia without behavioral disturbance: Secondary | ICD-10-CM | POA: Diagnosis not present

## 2016-12-27 DIAGNOSIS — G309 Alzheimer's disease, unspecified: Secondary | ICD-10-CM | POA: Diagnosis not present

## 2016-12-27 DIAGNOSIS — R54 Age-related physical debility: Secondary | ICD-10-CM | POA: Diagnosis not present

## 2016-12-27 DIAGNOSIS — R4182 Altered mental status, unspecified: Secondary | ICD-10-CM | POA: Diagnosis not present

## 2017-01-22 DIAGNOSIS — R404 Transient alteration of awareness: Secondary | ICD-10-CM | POA: Diagnosis not present

## 2017-01-22 DIAGNOSIS — R6889 Other general symptoms and signs: Secondary | ICD-10-CM | POA: Diagnosis not present

## 2017-01-23 DIAGNOSIS — N189 Chronic kidney disease, unspecified: Secondary | ICD-10-CM | POA: Diagnosis not present

## 2017-01-23 DIAGNOSIS — R0600 Dyspnea, unspecified: Secondary | ICD-10-CM | POA: Diagnosis not present

## 2017-01-23 DIAGNOSIS — I129 Hypertensive chronic kidney disease with stage 1 through stage 4 chronic kidney disease, or unspecified chronic kidney disease: Secondary | ICD-10-CM | POA: Diagnosis not present

## 2017-01-23 DIAGNOSIS — E1122 Type 2 diabetes mellitus with diabetic chronic kidney disease: Secondary | ICD-10-CM | POA: Diagnosis not present

## 2017-01-23 DIAGNOSIS — N390 Urinary tract infection, site not specified: Secondary | ICD-10-CM | POA: Diagnosis not present

## 2017-01-29 DIAGNOSIS — L0291 Cutaneous abscess, unspecified: Secondary | ICD-10-CM | POA: Diagnosis not present

## 2017-01-30 DIAGNOSIS — H353221 Exudative age-related macular degeneration, left eye, with active choroidal neovascularization: Secondary | ICD-10-CM | POA: Diagnosis not present

## 2017-02-20 DIAGNOSIS — H1032 Unspecified acute conjunctivitis, left eye: Secondary | ICD-10-CM | POA: Diagnosis not present

## 2017-02-20 DIAGNOSIS — R54 Age-related physical debility: Secondary | ICD-10-CM | POA: Diagnosis not present

## 2017-02-20 DIAGNOSIS — H353221 Exudative age-related macular degeneration, left eye, with active choroidal neovascularization: Secondary | ICD-10-CM | POA: Diagnosis not present

## 2017-03-03 DIAGNOSIS — I739 Peripheral vascular disease, unspecified: Secondary | ICD-10-CM | POA: Diagnosis not present

## 2017-03-03 DIAGNOSIS — I70203 Unspecified atherosclerosis of native arteries of extremities, bilateral legs: Secondary | ICD-10-CM | POA: Diagnosis not present

## 2017-03-03 DIAGNOSIS — R262 Difficulty in walking, not elsewhere classified: Secondary | ICD-10-CM | POA: Diagnosis not present

## 2017-03-03 DIAGNOSIS — B351 Tinea unguium: Secondary | ICD-10-CM | POA: Diagnosis not present

## 2017-04-03 DIAGNOSIS — D649 Anemia, unspecified: Secondary | ICD-10-CM | POA: Diagnosis not present

## 2017-04-03 DIAGNOSIS — I4891 Unspecified atrial fibrillation: Secondary | ICD-10-CM | POA: Diagnosis not present

## 2017-04-03 DIAGNOSIS — E1140 Type 2 diabetes mellitus with diabetic neuropathy, unspecified: Secondary | ICD-10-CM | POA: Diagnosis not present

## 2017-04-03 DIAGNOSIS — J449 Chronic obstructive pulmonary disease, unspecified: Secondary | ICD-10-CM | POA: Diagnosis not present

## 2017-04-03 DIAGNOSIS — Z6833 Body mass index (BMI) 33.0-33.9, adult: Secondary | ICD-10-CM | POA: Diagnosis not present

## 2017-04-04 DIAGNOSIS — E119 Type 2 diabetes mellitus without complications: Secondary | ICD-10-CM | POA: Diagnosis not present

## 2017-04-04 DIAGNOSIS — I10 Essential (primary) hypertension: Secondary | ICD-10-CM | POA: Diagnosis not present

## 2017-04-04 DIAGNOSIS — D649 Anemia, unspecified: Secondary | ICD-10-CM | POA: Diagnosis not present

## 2017-04-04 DIAGNOSIS — E559 Vitamin D deficiency, unspecified: Secondary | ICD-10-CM | POA: Diagnosis not present

## 2017-04-04 DIAGNOSIS — E039 Hypothyroidism, unspecified: Secondary | ICD-10-CM | POA: Diagnosis not present

## 2017-04-17 DIAGNOSIS — H353221 Exudative age-related macular degeneration, left eye, with active choroidal neovascularization: Secondary | ICD-10-CM | POA: Diagnosis not present

## 2017-05-13 DIAGNOSIS — R0989 Other specified symptoms and signs involving the circulatory and respiratory systems: Secondary | ICD-10-CM | POA: Diagnosis not present

## 2017-05-13 DIAGNOSIS — J449 Chronic obstructive pulmonary disease, unspecified: Secondary | ICD-10-CM | POA: Diagnosis not present

## 2017-05-13 DIAGNOSIS — R54 Age-related physical debility: Secondary | ICD-10-CM | POA: Diagnosis not present

## 2017-05-13 DIAGNOSIS — R05 Cough: Secondary | ICD-10-CM | POA: Diagnosis not present

## 2017-05-14 DIAGNOSIS — J208 Acute bronchitis due to other specified organisms: Secondary | ICD-10-CM | POA: Diagnosis not present

## 2017-05-14 DIAGNOSIS — R0602 Shortness of breath: Secondary | ICD-10-CM | POA: Diagnosis not present

## 2017-05-14 DIAGNOSIS — R05 Cough: Secondary | ICD-10-CM | POA: Diagnosis not present

## 2017-05-14 DIAGNOSIS — S0101XA Laceration without foreign body of scalp, initial encounter: Secondary | ICD-10-CM | POA: Diagnosis not present

## 2017-05-14 DIAGNOSIS — Z7901 Long term (current) use of anticoagulants: Secondary | ICD-10-CM | POA: Diagnosis not present

## 2017-05-14 DIAGNOSIS — W010XXA Fall on same level from slipping, tripping and stumbling without subsequent striking against object, initial encounter: Secondary | ICD-10-CM | POA: Diagnosis not present

## 2017-05-14 DIAGNOSIS — R58 Hemorrhage, not elsewhere classified: Secondary | ICD-10-CM | POA: Diagnosis not present

## 2017-05-14 DIAGNOSIS — J440 Chronic obstructive pulmonary disease with acute lower respiratory infection: Secondary | ICD-10-CM | POA: Diagnosis not present

## 2017-05-14 DIAGNOSIS — S0191XA Laceration without foreign body of unspecified part of head, initial encounter: Secondary | ICD-10-CM | POA: Diagnosis not present

## 2017-05-14 DIAGNOSIS — I4891 Unspecified atrial fibrillation: Secondary | ICD-10-CM | POA: Diagnosis not present

## 2017-05-20 DIAGNOSIS — R54 Age-related physical debility: Secondary | ICD-10-CM | POA: Diagnosis not present

## 2017-05-20 DIAGNOSIS — L989 Disorder of the skin and subcutaneous tissue, unspecified: Secondary | ICD-10-CM | POA: Diagnosis not present

## 2017-05-20 DIAGNOSIS — L03312 Cellulitis of back [any part except buttock]: Secondary | ICD-10-CM | POA: Diagnosis not present

## 2017-05-26 DIAGNOSIS — M545 Low back pain: Secondary | ICD-10-CM | POA: Diagnosis not present

## 2017-05-29 DIAGNOSIS — L02212 Cutaneous abscess of back [any part, except buttock]: Secondary | ICD-10-CM | POA: Diagnosis not present

## 2017-05-29 DIAGNOSIS — L578 Other skin changes due to chronic exposure to nonionizing radiation: Secondary | ICD-10-CM | POA: Diagnosis not present

## 2017-05-29 DIAGNOSIS — E1165 Type 2 diabetes mellitus with hyperglycemia: Secondary | ICD-10-CM | POA: Diagnosis not present

## 2017-05-29 DIAGNOSIS — R54 Age-related physical debility: Secondary | ICD-10-CM | POA: Diagnosis not present

## 2017-05-29 DIAGNOSIS — L0390 Cellulitis, unspecified: Secondary | ICD-10-CM | POA: Diagnosis not present

## 2017-06-03 DIAGNOSIS — Z7901 Long term (current) use of anticoagulants: Secondary | ICD-10-CM | POA: Diagnosis not present

## 2017-06-05 DIAGNOSIS — H353221 Exudative age-related macular degeneration, left eye, with active choroidal neovascularization: Secondary | ICD-10-CM | POA: Diagnosis not present

## 2017-06-19 DIAGNOSIS — E1140 Type 2 diabetes mellitus with diabetic neuropathy, unspecified: Secondary | ICD-10-CM | POA: Diagnosis not present

## 2017-06-19 DIAGNOSIS — F0390 Unspecified dementia without behavioral disturbance: Secondary | ICD-10-CM | POA: Diagnosis not present

## 2017-06-19 DIAGNOSIS — I4891 Unspecified atrial fibrillation: Secondary | ICD-10-CM | POA: Diagnosis not present

## 2017-06-19 DIAGNOSIS — J449 Chronic obstructive pulmonary disease, unspecified: Secondary | ICD-10-CM | POA: Diagnosis not present

## 2017-07-08 DIAGNOSIS — S8002XA Contusion of left knee, initial encounter: Secondary | ICD-10-CM | POA: Diagnosis not present

## 2017-07-08 DIAGNOSIS — M6281 Muscle weakness (generalized): Secondary | ICD-10-CM | POA: Diagnosis not present

## 2017-07-08 DIAGNOSIS — R269 Unspecified abnormalities of gait and mobility: Secondary | ICD-10-CM | POA: Diagnosis not present

## 2017-07-08 DIAGNOSIS — S00511A Abrasion of lip, initial encounter: Secondary | ICD-10-CM | POA: Diagnosis not present

## 2017-07-08 DIAGNOSIS — W19XXXA Unspecified fall, initial encounter: Secondary | ICD-10-CM | POA: Diagnosis not present

## 2017-07-08 DIAGNOSIS — R54 Age-related physical debility: Secondary | ICD-10-CM | POA: Diagnosis not present

## 2017-07-17 DIAGNOSIS — H353222 Exudative age-related macular degeneration, left eye, with inactive choroidal neovascularization: Secondary | ICD-10-CM | POA: Diagnosis not present

## 2017-08-11 DIAGNOSIS — R069 Unspecified abnormalities of breathing: Secondary | ICD-10-CM | POA: Diagnosis not present

## 2017-08-11 DIAGNOSIS — J811 Chronic pulmonary edema: Secondary | ICD-10-CM | POA: Diagnosis not present

## 2017-08-11 DIAGNOSIS — J90 Pleural effusion, not elsewhere classified: Secondary | ICD-10-CM | POA: Diagnosis not present

## 2017-08-11 DIAGNOSIS — J441 Chronic obstructive pulmonary disease with (acute) exacerbation: Secondary | ICD-10-CM | POA: Diagnosis not present

## 2017-08-11 DIAGNOSIS — R0902 Hypoxemia: Secondary | ICD-10-CM | POA: Diagnosis not present

## 2017-08-11 DIAGNOSIS — A419 Sepsis, unspecified organism: Secondary | ICD-10-CM | POA: Diagnosis not present

## 2017-08-12 DIAGNOSIS — J90 Pleural effusion, not elsewhere classified: Secondary | ICD-10-CM | POA: Diagnosis not present

## 2017-08-12 DIAGNOSIS — E785 Hyperlipidemia, unspecified: Secondary | ICD-10-CM | POA: Diagnosis not present

## 2017-08-12 DIAGNOSIS — J9601 Acute respiratory failure with hypoxia: Secondary | ICD-10-CM | POA: Diagnosis present

## 2017-08-12 DIAGNOSIS — I10 Essential (primary) hypertension: Secondary | ICD-10-CM | POA: Diagnosis not present

## 2017-08-12 DIAGNOSIS — R0602 Shortness of breath: Secondary | ICD-10-CM | POA: Diagnosis not present

## 2017-08-12 DIAGNOSIS — J208 Acute bronchitis due to other specified organisms: Secondary | ICD-10-CM | POA: Diagnosis not present

## 2017-08-12 DIAGNOSIS — R079 Chest pain, unspecified: Secondary | ICD-10-CM | POA: Diagnosis not present

## 2017-08-12 DIAGNOSIS — J811 Chronic pulmonary edema: Secondary | ICD-10-CM | POA: Diagnosis not present

## 2017-08-12 DIAGNOSIS — I509 Heart failure, unspecified: Secondary | ICD-10-CM | POA: Diagnosis not present

## 2017-08-12 DIAGNOSIS — R0902 Hypoxemia: Secondary | ICD-10-CM | POA: Diagnosis not present

## 2017-08-12 DIAGNOSIS — I5032 Chronic diastolic (congestive) heart failure: Secondary | ICD-10-CM | POA: Diagnosis not present

## 2017-08-12 DIAGNOSIS — E1165 Type 2 diabetes mellitus with hyperglycemia: Secondary | ICD-10-CM | POA: Diagnosis present

## 2017-08-12 DIAGNOSIS — F329 Major depressive disorder, single episode, unspecified: Secondary | ICD-10-CM | POA: Diagnosis present

## 2017-08-12 DIAGNOSIS — I129 Hypertensive chronic kidney disease with stage 1 through stage 4 chronic kidney disease, or unspecified chronic kidney disease: Secondary | ICD-10-CM | POA: Diagnosis not present

## 2017-08-12 DIAGNOSIS — E7800 Pure hypercholesterolemia, unspecified: Secondary | ICD-10-CM | POA: Diagnosis present

## 2017-08-12 DIAGNOSIS — Z7901 Long term (current) use of anticoagulants: Secondary | ICD-10-CM | POA: Diagnosis not present

## 2017-08-12 DIAGNOSIS — J441 Chronic obstructive pulmonary disease with (acute) exacerbation: Secondary | ICD-10-CM | POA: Diagnosis not present

## 2017-08-12 DIAGNOSIS — Z888 Allergy status to other drugs, medicaments and biological substances status: Secondary | ICD-10-CM | POA: Diagnosis not present

## 2017-08-12 DIAGNOSIS — I130 Hypertensive heart and chronic kidney disease with heart failure and stage 1 through stage 4 chronic kidney disease, or unspecified chronic kidney disease: Secondary | ICD-10-CM | POA: Diagnosis present

## 2017-08-12 DIAGNOSIS — K219 Gastro-esophageal reflux disease without esophagitis: Secondary | ICD-10-CM | POA: Diagnosis present

## 2017-08-12 DIAGNOSIS — I4891 Unspecified atrial fibrillation: Secondary | ICD-10-CM | POA: Diagnosis not present

## 2017-08-12 DIAGNOSIS — A419 Sepsis, unspecified organism: Secondary | ICD-10-CM | POA: Diagnosis not present

## 2017-08-12 DIAGNOSIS — Z882 Allergy status to sulfonamides status: Secondary | ICD-10-CM | POA: Diagnosis not present

## 2017-08-12 DIAGNOSIS — J449 Chronic obstructive pulmonary disease, unspecified: Secondary | ICD-10-CM | POA: Diagnosis present

## 2017-08-12 DIAGNOSIS — F0390 Unspecified dementia without behavioral disturbance: Secondary | ICD-10-CM | POA: Diagnosis not present

## 2017-08-12 DIAGNOSIS — N183 Chronic kidney disease, stage 3 (moderate): Secondary | ICD-10-CM | POA: Diagnosis not present

## 2017-08-12 DIAGNOSIS — E1142 Type 2 diabetes mellitus with diabetic polyneuropathy: Secondary | ICD-10-CM | POA: Diagnosis not present

## 2017-08-12 DIAGNOSIS — Z79899 Other long term (current) drug therapy: Secondary | ICD-10-CM | POA: Diagnosis not present

## 2017-08-12 DIAGNOSIS — I350 Nonrheumatic aortic (valve) stenosis: Secondary | ICD-10-CM | POA: Diagnosis not present

## 2017-08-12 DIAGNOSIS — N390 Urinary tract infection, site not specified: Secondary | ICD-10-CM | POA: Diagnosis present

## 2017-08-12 DIAGNOSIS — E1122 Type 2 diabetes mellitus with diabetic chronic kidney disease: Secondary | ICD-10-CM | POA: Diagnosis present

## 2017-08-14 DIAGNOSIS — I509 Heart failure, unspecified: Secondary | ICD-10-CM | POA: Diagnosis not present

## 2017-08-14 DIAGNOSIS — N183 Chronic kidney disease, stage 3 (moderate): Secondary | ICD-10-CM | POA: Diagnosis not present

## 2017-08-14 DIAGNOSIS — I4891 Unspecified atrial fibrillation: Secondary | ICD-10-CM | POA: Diagnosis not present

## 2017-08-14 DIAGNOSIS — J208 Acute bronchitis due to other specified organisms: Secondary | ICD-10-CM | POA: Diagnosis not present

## 2017-08-14 DIAGNOSIS — I129 Hypertensive chronic kidney disease with stage 1 through stage 4 chronic kidney disease, or unspecified chronic kidney disease: Secondary | ICD-10-CM | POA: Diagnosis not present

## 2017-08-21 DIAGNOSIS — H353222 Exudative age-related macular degeneration, left eye, with inactive choroidal neovascularization: Secondary | ICD-10-CM | POA: Diagnosis not present

## 2017-09-03 DIAGNOSIS — Z972 Presence of dental prosthetic device (complete) (partial): Secondary | ICD-10-CM | POA: Diagnosis not present

## 2017-09-03 DIAGNOSIS — K0889 Other specified disorders of teeth and supporting structures: Secondary | ICD-10-CM | POA: Diagnosis not present

## 2017-09-15 DIAGNOSIS — R54 Age-related physical debility: Secondary | ICD-10-CM | POA: Diagnosis not present

## 2017-09-15 DIAGNOSIS — I10 Essential (primary) hypertension: Secondary | ICD-10-CM | POA: Diagnosis not present

## 2017-09-15 DIAGNOSIS — F0390 Unspecified dementia without behavioral disturbance: Secondary | ICD-10-CM | POA: Diagnosis not present

## 2017-09-16 DIAGNOSIS — S62633A Displaced fracture of distal phalanx of left middle finger, initial encounter for closed fracture: Secondary | ICD-10-CM | POA: Diagnosis not present

## 2017-09-16 DIAGNOSIS — R269 Unspecified abnormalities of gait and mobility: Secondary | ICD-10-CM | POA: Diagnosis not present

## 2017-09-16 DIAGNOSIS — S62603S Fracture of unspecified phalanx of left middle finger, sequela: Secondary | ICD-10-CM | POA: Diagnosis not present

## 2017-09-16 DIAGNOSIS — S60222S Contusion of left hand, sequela: Secondary | ICD-10-CM | POA: Diagnosis not present

## 2017-09-16 DIAGNOSIS — T148XXA Other injury of unspecified body region, initial encounter: Secondary | ICD-10-CM | POA: Diagnosis not present

## 2017-09-16 DIAGNOSIS — S62631B Displaced fracture of distal phalanx of left index finger, initial encounter for open fracture: Secondary | ICD-10-CM | POA: Diagnosis not present

## 2017-09-16 DIAGNOSIS — S61309A Unspecified open wound of unspecified finger with damage to nail, initial encounter: Secondary | ICD-10-CM | POA: Diagnosis not present

## 2017-09-16 DIAGNOSIS — F0390 Unspecified dementia without behavioral disturbance: Secondary | ICD-10-CM | POA: Diagnosis not present

## 2017-09-16 DIAGNOSIS — S61311S Laceration without foreign body of left index finger with damage to nail, sequela: Secondary | ICD-10-CM | POA: Diagnosis not present

## 2017-09-16 DIAGNOSIS — S61301S Unspecified open wound of left index finger with damage to nail, sequela: Secondary | ICD-10-CM | POA: Diagnosis not present

## 2017-09-16 DIAGNOSIS — F4489 Other dissociative and conversion disorders: Secondary | ICD-10-CM | POA: Diagnosis not present

## 2017-09-16 DIAGNOSIS — M79646 Pain in unspecified finger(s): Secondary | ICD-10-CM | POA: Diagnosis not present

## 2017-09-16 DIAGNOSIS — S61213S Laceration without foreign body of left middle finger without damage to nail, sequela: Secondary | ICD-10-CM | POA: Diagnosis not present

## 2017-09-18 DIAGNOSIS — Z23 Encounter for immunization: Secondary | ICD-10-CM | POA: Diagnosis not present

## 2017-09-23 DIAGNOSIS — S62633B Displaced fracture of distal phalanx of left middle finger, initial encounter for open fracture: Secondary | ICD-10-CM | POA: Diagnosis not present

## 2017-09-23 DIAGNOSIS — S62631B Displaced fracture of distal phalanx of left index finger, initial encounter for open fracture: Secondary | ICD-10-CM | POA: Diagnosis not present

## 2017-10-02 DIAGNOSIS — H353221 Exudative age-related macular degeneration, left eye, with active choroidal neovascularization: Secondary | ICD-10-CM | POA: Diagnosis not present

## 2017-10-03 DIAGNOSIS — I160 Hypertensive urgency: Secondary | ICD-10-CM | POA: Diagnosis not present

## 2017-10-03 DIAGNOSIS — I5033 Acute on chronic diastolic (congestive) heart failure: Secondary | ICD-10-CM | POA: Diagnosis not present

## 2017-10-03 DIAGNOSIS — E7800 Pure hypercholesterolemia, unspecified: Secondary | ICD-10-CM | POA: Diagnosis present

## 2017-10-03 DIAGNOSIS — I130 Hypertensive heart and chronic kidney disease with heart failure and stage 1 through stage 4 chronic kidney disease, or unspecified chronic kidney disease: Secondary | ICD-10-CM | POA: Diagnosis not present

## 2017-10-03 DIAGNOSIS — Z794 Long term (current) use of insulin: Secondary | ICD-10-CM | POA: Diagnosis not present

## 2017-10-03 DIAGNOSIS — M1990 Unspecified osteoarthritis, unspecified site: Secondary | ICD-10-CM | POA: Diagnosis present

## 2017-10-03 DIAGNOSIS — I4891 Unspecified atrial fibrillation: Secondary | ICD-10-CM | POA: Diagnosis not present

## 2017-10-03 DIAGNOSIS — Z79899 Other long term (current) drug therapy: Secondary | ICD-10-CM | POA: Diagnosis not present

## 2017-10-03 DIAGNOSIS — N183 Chronic kidney disease, stage 3 (moderate): Secondary | ICD-10-CM | POA: Diagnosis not present

## 2017-10-03 DIAGNOSIS — J9601 Acute respiratory failure with hypoxia: Secondary | ICD-10-CM | POA: Diagnosis not present

## 2017-10-03 DIAGNOSIS — I110 Hypertensive heart disease with heart failure: Secondary | ICD-10-CM | POA: Diagnosis not present

## 2017-10-03 DIAGNOSIS — R0602 Shortness of breath: Secondary | ICD-10-CM | POA: Diagnosis not present

## 2017-10-03 DIAGNOSIS — K219 Gastro-esophageal reflux disease without esophagitis: Secondary | ICD-10-CM | POA: Diagnosis present

## 2017-10-03 DIAGNOSIS — R0902 Hypoxemia: Secondary | ICD-10-CM | POA: Diagnosis not present

## 2017-10-03 DIAGNOSIS — R079 Chest pain, unspecified: Secondary | ICD-10-CM | POA: Diagnosis not present

## 2017-10-03 DIAGNOSIS — Z66 Do not resuscitate: Secondary | ICD-10-CM | POA: Diagnosis present

## 2017-10-03 DIAGNOSIS — Z888 Allergy status to other drugs, medicaments and biological substances status: Secondary | ICD-10-CM | POA: Diagnosis not present

## 2017-10-03 DIAGNOSIS — I482 Chronic atrial fibrillation: Secondary | ICD-10-CM | POA: Diagnosis not present

## 2017-10-03 DIAGNOSIS — Z882 Allergy status to sulfonamides status: Secondary | ICD-10-CM | POA: Diagnosis not present

## 2017-10-03 DIAGNOSIS — I350 Nonrheumatic aortic (valve) stenosis: Secondary | ICD-10-CM | POA: Diagnosis not present

## 2017-10-03 DIAGNOSIS — Z7901 Long term (current) use of anticoagulants: Secondary | ICD-10-CM | POA: Diagnosis not present

## 2017-10-03 DIAGNOSIS — E1122 Type 2 diabetes mellitus with diabetic chronic kidney disease: Secondary | ICD-10-CM | POA: Diagnosis present

## 2017-10-03 DIAGNOSIS — I509 Heart failure, unspecified: Secondary | ICD-10-CM | POA: Diagnosis not present

## 2017-10-03 DIAGNOSIS — F0390 Unspecified dementia without behavioral disturbance: Secondary | ICD-10-CM | POA: Diagnosis present

## 2017-10-03 DIAGNOSIS — J449 Chronic obstructive pulmonary disease, unspecified: Secondary | ICD-10-CM | POA: Diagnosis present

## 2017-10-06 DIAGNOSIS — I4891 Unspecified atrial fibrillation: Secondary | ICD-10-CM | POA: Diagnosis not present

## 2017-10-06 DIAGNOSIS — F0390 Unspecified dementia without behavioral disturbance: Secondary | ICD-10-CM | POA: Diagnosis not present

## 2017-10-06 DIAGNOSIS — J9601 Acute respiratory failure with hypoxia: Secondary | ICD-10-CM | POA: Diagnosis not present

## 2017-10-06 DIAGNOSIS — R0602 Shortness of breath: Secondary | ICD-10-CM | POA: Diagnosis not present

## 2017-10-06 DIAGNOSIS — I5033 Acute on chronic diastolic (congestive) heart failure: Secondary | ICD-10-CM | POA: Diagnosis not present

## 2017-10-06 DIAGNOSIS — I350 Nonrheumatic aortic (valve) stenosis: Secondary | ICD-10-CM | POA: Diagnosis not present

## 2017-11-06 DIAGNOSIS — H353221 Exudative age-related macular degeneration, left eye, with active choroidal neovascularization: Secondary | ICD-10-CM | POA: Diagnosis not present

## 2017-11-06 DIAGNOSIS — E119 Type 2 diabetes mellitus without complications: Secondary | ICD-10-CM | POA: Diagnosis not present

## 2017-12-02 DIAGNOSIS — R54 Age-related physical debility: Secondary | ICD-10-CM | POA: Diagnosis not present

## 2017-12-02 DIAGNOSIS — M545 Low back pain: Secondary | ICD-10-CM | POA: Diagnosis not present

## 2017-12-02 DIAGNOSIS — R269 Unspecified abnormalities of gait and mobility: Secondary | ICD-10-CM | POA: Diagnosis not present

## 2017-12-02 DIAGNOSIS — F0390 Unspecified dementia without behavioral disturbance: Secondary | ICD-10-CM | POA: Diagnosis not present

## 2017-12-05 DIAGNOSIS — L89322 Pressure ulcer of left buttock, stage 2: Secondary | ICD-10-CM | POA: Diagnosis not present

## 2017-12-05 DIAGNOSIS — F0390 Unspecified dementia without behavioral disturbance: Secondary | ICD-10-CM | POA: Diagnosis not present

## 2017-12-05 DIAGNOSIS — L89312 Pressure ulcer of right buttock, stage 2: Secondary | ICD-10-CM | POA: Diagnosis not present

## 2017-12-09 DIAGNOSIS — F0280 Dementia in other diseases classified elsewhere without behavioral disturbance: Secondary | ICD-10-CM | POA: Diagnosis not present

## 2017-12-09 DIAGNOSIS — Z9181 History of falling: Secondary | ICD-10-CM | POA: Diagnosis not present

## 2017-12-09 DIAGNOSIS — L89322 Pressure ulcer of left buttock, stage 2: Secondary | ICD-10-CM | POA: Diagnosis not present

## 2017-12-09 DIAGNOSIS — F329 Major depressive disorder, single episode, unspecified: Secondary | ICD-10-CM | POA: Diagnosis not present

## 2017-12-09 DIAGNOSIS — K769 Liver disease, unspecified: Secondary | ICD-10-CM | POA: Diagnosis not present

## 2017-12-09 DIAGNOSIS — I110 Hypertensive heart disease with heart failure: Secondary | ICD-10-CM | POA: Diagnosis not present

## 2017-12-09 DIAGNOSIS — I509 Heart failure, unspecified: Secondary | ICD-10-CM | POA: Diagnosis not present

## 2017-12-09 DIAGNOSIS — L89312 Pressure ulcer of right buttock, stage 2: Secondary | ICD-10-CM | POA: Diagnosis not present

## 2017-12-09 DIAGNOSIS — F419 Anxiety disorder, unspecified: Secondary | ICD-10-CM | POA: Diagnosis not present

## 2017-12-09 DIAGNOSIS — M1991 Primary osteoarthritis, unspecified site: Secondary | ICD-10-CM | POA: Diagnosis not present

## 2017-12-09 DIAGNOSIS — E119 Type 2 diabetes mellitus without complications: Secondary | ICD-10-CM | POA: Diagnosis not present

## 2017-12-09 DIAGNOSIS — G309 Alzheimer's disease, unspecified: Secondary | ICD-10-CM | POA: Diagnosis not present

## 2017-12-09 DIAGNOSIS — Z7901 Long term (current) use of anticoagulants: Secondary | ICD-10-CM | POA: Diagnosis not present

## 2017-12-09 DIAGNOSIS — D649 Anemia, unspecified: Secondary | ICD-10-CM | POA: Diagnosis not present

## 2017-12-09 DIAGNOSIS — Z794 Long term (current) use of insulin: Secondary | ICD-10-CM | POA: Diagnosis not present

## 2017-12-09 DIAGNOSIS — J449 Chronic obstructive pulmonary disease, unspecified: Secondary | ICD-10-CM | POA: Diagnosis not present

## 2017-12-09 DIAGNOSIS — Z87891 Personal history of nicotine dependence: Secondary | ICD-10-CM | POA: Diagnosis not present

## 2017-12-09 DIAGNOSIS — J9610 Chronic respiratory failure, unspecified whether with hypoxia or hypercapnia: Secondary | ICD-10-CM | POA: Diagnosis not present

## 2017-12-09 DIAGNOSIS — I4891 Unspecified atrial fibrillation: Secondary | ICD-10-CM | POA: Diagnosis not present

## 2017-12-12 DIAGNOSIS — G309 Alzheimer's disease, unspecified: Secondary | ICD-10-CM | POA: Diagnosis not present

## 2017-12-12 DIAGNOSIS — L89322 Pressure ulcer of left buttock, stage 2: Secondary | ICD-10-CM | POA: Diagnosis not present

## 2017-12-12 DIAGNOSIS — I110 Hypertensive heart disease with heart failure: Secondary | ICD-10-CM | POA: Diagnosis not present

## 2017-12-12 DIAGNOSIS — L89312 Pressure ulcer of right buttock, stage 2: Secondary | ICD-10-CM | POA: Diagnosis not present

## 2017-12-12 DIAGNOSIS — I509 Heart failure, unspecified: Secondary | ICD-10-CM | POA: Diagnosis not present

## 2017-12-12 DIAGNOSIS — F0280 Dementia in other diseases classified elsewhere without behavioral disturbance: Secondary | ICD-10-CM | POA: Diagnosis not present

## 2017-12-15 DIAGNOSIS — F0280 Dementia in other diseases classified elsewhere without behavioral disturbance: Secondary | ICD-10-CM | POA: Diagnosis not present

## 2017-12-15 DIAGNOSIS — L89322 Pressure ulcer of left buttock, stage 2: Secondary | ICD-10-CM | POA: Diagnosis not present

## 2017-12-15 DIAGNOSIS — I110 Hypertensive heart disease with heart failure: Secondary | ICD-10-CM | POA: Diagnosis not present

## 2017-12-15 DIAGNOSIS — L89312 Pressure ulcer of right buttock, stage 2: Secondary | ICD-10-CM | POA: Diagnosis not present

## 2017-12-15 DIAGNOSIS — I509 Heart failure, unspecified: Secondary | ICD-10-CM | POA: Diagnosis not present

## 2017-12-15 DIAGNOSIS — G309 Alzheimer's disease, unspecified: Secondary | ICD-10-CM | POA: Diagnosis not present

## 2017-12-17 DIAGNOSIS — I509 Heart failure, unspecified: Secondary | ICD-10-CM | POA: Diagnosis not present

## 2017-12-17 DIAGNOSIS — R600 Localized edema: Secondary | ICD-10-CM | POA: Diagnosis not present

## 2017-12-18 DIAGNOSIS — I509 Heart failure, unspecified: Secondary | ICD-10-CM | POA: Diagnosis not present

## 2017-12-18 DIAGNOSIS — L89322 Pressure ulcer of left buttock, stage 2: Secondary | ICD-10-CM | POA: Diagnosis not present

## 2017-12-18 DIAGNOSIS — F0280 Dementia in other diseases classified elsewhere without behavioral disturbance: Secondary | ICD-10-CM | POA: Diagnosis not present

## 2017-12-18 DIAGNOSIS — L89312 Pressure ulcer of right buttock, stage 2: Secondary | ICD-10-CM | POA: Diagnosis not present

## 2017-12-18 DIAGNOSIS — G309 Alzheimer's disease, unspecified: Secondary | ICD-10-CM | POA: Diagnosis not present

## 2017-12-18 DIAGNOSIS — H353221 Exudative age-related macular degeneration, left eye, with active choroidal neovascularization: Secondary | ICD-10-CM | POA: Diagnosis not present

## 2017-12-18 DIAGNOSIS — I110 Hypertensive heart disease with heart failure: Secondary | ICD-10-CM | POA: Diagnosis not present

## 2017-12-19 DIAGNOSIS — I509 Heart failure, unspecified: Secondary | ICD-10-CM | POA: Diagnosis not present

## 2017-12-19 DIAGNOSIS — N183 Chronic kidney disease, stage 3 (moderate): Secondary | ICD-10-CM | POA: Diagnosis not present

## 2017-12-19 DIAGNOSIS — L304 Erythema intertrigo: Secondary | ICD-10-CM | POA: Diagnosis not present

## 2017-12-19 DIAGNOSIS — F0390 Unspecified dementia without behavioral disturbance: Secondary | ICD-10-CM | POA: Diagnosis not present

## 2017-12-19 DIAGNOSIS — R600 Localized edema: Secondary | ICD-10-CM | POA: Diagnosis not present

## 2017-12-19 DIAGNOSIS — I10 Essential (primary) hypertension: Secondary | ICD-10-CM | POA: Diagnosis not present

## 2017-12-19 DIAGNOSIS — R635 Abnormal weight gain: Secondary | ICD-10-CM | POA: Diagnosis not present

## 2017-12-22 DIAGNOSIS — L89312 Pressure ulcer of right buttock, stage 2: Secondary | ICD-10-CM | POA: Diagnosis not present

## 2017-12-22 DIAGNOSIS — I509 Heart failure, unspecified: Secondary | ICD-10-CM | POA: Diagnosis not present

## 2017-12-22 DIAGNOSIS — L89322 Pressure ulcer of left buttock, stage 2: Secondary | ICD-10-CM | POA: Diagnosis not present

## 2017-12-22 DIAGNOSIS — G309 Alzheimer's disease, unspecified: Secondary | ICD-10-CM | POA: Diagnosis not present

## 2017-12-22 DIAGNOSIS — F0280 Dementia in other diseases classified elsewhere without behavioral disturbance: Secondary | ICD-10-CM | POA: Diagnosis not present

## 2017-12-22 DIAGNOSIS — I110 Hypertensive heart disease with heart failure: Secondary | ICD-10-CM | POA: Diagnosis not present

## 2017-12-25 DIAGNOSIS — L89322 Pressure ulcer of left buttock, stage 2: Secondary | ICD-10-CM | POA: Diagnosis not present

## 2017-12-25 DIAGNOSIS — F0280 Dementia in other diseases classified elsewhere without behavioral disturbance: Secondary | ICD-10-CM | POA: Diagnosis not present

## 2017-12-25 DIAGNOSIS — G309 Alzheimer's disease, unspecified: Secondary | ICD-10-CM | POA: Diagnosis not present

## 2017-12-25 DIAGNOSIS — I509 Heart failure, unspecified: Secondary | ICD-10-CM | POA: Diagnosis not present

## 2017-12-25 DIAGNOSIS — L89312 Pressure ulcer of right buttock, stage 2: Secondary | ICD-10-CM | POA: Diagnosis not present

## 2017-12-25 DIAGNOSIS — I110 Hypertensive heart disease with heart failure: Secondary | ICD-10-CM | POA: Diagnosis not present

## 2017-12-30 DIAGNOSIS — I509 Heart failure, unspecified: Secondary | ICD-10-CM | POA: Diagnosis not present

## 2017-12-30 DIAGNOSIS — G309 Alzheimer's disease, unspecified: Secondary | ICD-10-CM | POA: Diagnosis not present

## 2017-12-30 DIAGNOSIS — L89322 Pressure ulcer of left buttock, stage 2: Secondary | ICD-10-CM | POA: Diagnosis not present

## 2017-12-30 DIAGNOSIS — L89312 Pressure ulcer of right buttock, stage 2: Secondary | ICD-10-CM | POA: Diagnosis not present

## 2017-12-30 DIAGNOSIS — I110 Hypertensive heart disease with heart failure: Secondary | ICD-10-CM | POA: Diagnosis not present

## 2017-12-30 DIAGNOSIS — F0280 Dementia in other diseases classified elsewhere without behavioral disturbance: Secondary | ICD-10-CM | POA: Diagnosis not present

## 2018-01-02 DIAGNOSIS — L89322 Pressure ulcer of left buttock, stage 2: Secondary | ICD-10-CM | POA: Diagnosis not present

## 2018-01-02 DIAGNOSIS — F0280 Dementia in other diseases classified elsewhere without behavioral disturbance: Secondary | ICD-10-CM | POA: Diagnosis not present

## 2018-01-02 DIAGNOSIS — L89312 Pressure ulcer of right buttock, stage 2: Secondary | ICD-10-CM | POA: Diagnosis not present

## 2018-01-02 DIAGNOSIS — I509 Heart failure, unspecified: Secondary | ICD-10-CM | POA: Diagnosis not present

## 2018-01-02 DIAGNOSIS — G309 Alzheimer's disease, unspecified: Secondary | ICD-10-CM | POA: Diagnosis not present

## 2018-01-02 DIAGNOSIS — I110 Hypertensive heart disease with heart failure: Secondary | ICD-10-CM | POA: Diagnosis not present

## 2018-01-05 DIAGNOSIS — F0280 Dementia in other diseases classified elsewhere without behavioral disturbance: Secondary | ICD-10-CM | POA: Diagnosis not present

## 2018-01-05 DIAGNOSIS — G309 Alzheimer's disease, unspecified: Secondary | ICD-10-CM | POA: Diagnosis not present

## 2018-01-05 DIAGNOSIS — I509 Heart failure, unspecified: Secondary | ICD-10-CM | POA: Diagnosis not present

## 2018-01-05 DIAGNOSIS — I110 Hypertensive heart disease with heart failure: Secondary | ICD-10-CM | POA: Diagnosis not present

## 2018-01-05 DIAGNOSIS — L89322 Pressure ulcer of left buttock, stage 2: Secondary | ICD-10-CM | POA: Diagnosis not present

## 2018-01-05 DIAGNOSIS — L89312 Pressure ulcer of right buttock, stage 2: Secondary | ICD-10-CM | POA: Diagnosis not present

## 2018-01-06 DIAGNOSIS — J449 Chronic obstructive pulmonary disease, unspecified: Secondary | ICD-10-CM | POA: Diagnosis not present

## 2018-01-06 DIAGNOSIS — I4891 Unspecified atrial fibrillation: Secondary | ICD-10-CM | POA: Diagnosis not present

## 2018-01-06 DIAGNOSIS — R0602 Shortness of breath: Secondary | ICD-10-CM | POA: Diagnosis not present

## 2018-01-06 DIAGNOSIS — F0390 Unspecified dementia without behavioral disturbance: Secondary | ICD-10-CM | POA: Diagnosis not present

## 2018-01-08 DIAGNOSIS — G309 Alzheimer's disease, unspecified: Secondary | ICD-10-CM | POA: Diagnosis not present

## 2018-01-08 DIAGNOSIS — F0280 Dementia in other diseases classified elsewhere without behavioral disturbance: Secondary | ICD-10-CM | POA: Diagnosis not present

## 2018-01-08 DIAGNOSIS — L89312 Pressure ulcer of right buttock, stage 2: Secondary | ICD-10-CM | POA: Diagnosis not present

## 2018-01-08 DIAGNOSIS — I110 Hypertensive heart disease with heart failure: Secondary | ICD-10-CM | POA: Diagnosis not present

## 2018-01-08 DIAGNOSIS — L89322 Pressure ulcer of left buttock, stage 2: Secondary | ICD-10-CM | POA: Diagnosis not present

## 2018-01-08 DIAGNOSIS — I509 Heart failure, unspecified: Secondary | ICD-10-CM | POA: Diagnosis not present

## 2018-01-13 DIAGNOSIS — G309 Alzheimer's disease, unspecified: Secondary | ICD-10-CM | POA: Diagnosis not present

## 2018-01-13 DIAGNOSIS — L89312 Pressure ulcer of right buttock, stage 2: Secondary | ICD-10-CM | POA: Diagnosis not present

## 2018-01-13 DIAGNOSIS — F0280 Dementia in other diseases classified elsewhere without behavioral disturbance: Secondary | ICD-10-CM | POA: Diagnosis not present

## 2018-01-13 DIAGNOSIS — L89322 Pressure ulcer of left buttock, stage 2: Secondary | ICD-10-CM | POA: Diagnosis not present

## 2018-01-13 DIAGNOSIS — I509 Heart failure, unspecified: Secondary | ICD-10-CM | POA: Diagnosis not present

## 2018-01-13 DIAGNOSIS — I110 Hypertensive heart disease with heart failure: Secondary | ICD-10-CM | POA: Diagnosis not present

## 2018-01-15 DIAGNOSIS — H353222 Exudative age-related macular degeneration, left eye, with inactive choroidal neovascularization: Secondary | ICD-10-CM | POA: Diagnosis not present

## 2018-01-30 DIAGNOSIS — I4891 Unspecified atrial fibrillation: Secondary | ICD-10-CM | POA: Diagnosis not present

## 2018-01-30 DIAGNOSIS — F0390 Unspecified dementia without behavioral disturbance: Secondary | ICD-10-CM | POA: Diagnosis not present

## 2018-01-30 DIAGNOSIS — I129 Hypertensive chronic kidney disease with stage 1 through stage 4 chronic kidney disease, or unspecified chronic kidney disease: Secondary | ICD-10-CM | POA: Diagnosis not present

## 2018-01-30 DIAGNOSIS — L304 Erythema intertrigo: Secondary | ICD-10-CM | POA: Diagnosis not present

## 2018-01-30 DIAGNOSIS — N183 Chronic kidney disease, stage 3 (moderate): Secondary | ICD-10-CM | POA: Diagnosis not present

## 2018-02-03 DIAGNOSIS — E119 Type 2 diabetes mellitus without complications: Secondary | ICD-10-CM | POA: Diagnosis not present

## 2018-02-03 DIAGNOSIS — I10 Essential (primary) hypertension: Secondary | ICD-10-CM | POA: Diagnosis not present

## 2018-02-03 DIAGNOSIS — D649 Anemia, unspecified: Secondary | ICD-10-CM | POA: Diagnosis not present

## 2018-02-05 DIAGNOSIS — L89322 Pressure ulcer of left buttock, stage 2: Secondary | ICD-10-CM | POA: Diagnosis not present

## 2018-02-05 DIAGNOSIS — L89312 Pressure ulcer of right buttock, stage 2: Secondary | ICD-10-CM | POA: Diagnosis not present

## 2018-02-05 DIAGNOSIS — F0280 Dementia in other diseases classified elsewhere without behavioral disturbance: Secondary | ICD-10-CM | POA: Diagnosis not present

## 2018-02-05 DIAGNOSIS — I110 Hypertensive heart disease with heart failure: Secondary | ICD-10-CM | POA: Diagnosis not present

## 2018-02-05 DIAGNOSIS — I509 Heart failure, unspecified: Secondary | ICD-10-CM | POA: Diagnosis not present

## 2018-02-05 DIAGNOSIS — G309 Alzheimer's disease, unspecified: Secondary | ICD-10-CM | POA: Diagnosis not present

## 2018-02-06 DIAGNOSIS — G309 Alzheimer's disease, unspecified: Secondary | ICD-10-CM | POA: Diagnosis not present

## 2018-02-06 DIAGNOSIS — L22 Diaper dermatitis: Secondary | ICD-10-CM | POA: Diagnosis not present

## 2018-02-12 DIAGNOSIS — H353222 Exudative age-related macular degeneration, left eye, with inactive choroidal neovascularization: Secondary | ICD-10-CM | POA: Diagnosis not present

## 2018-03-13 DIAGNOSIS — L89320 Pressure ulcer of left buttock, unstageable: Secondary | ICD-10-CM | POA: Diagnosis not present

## 2018-03-13 DIAGNOSIS — R54 Age-related physical debility: Secondary | ICD-10-CM | POA: Diagnosis not present

## 2018-03-13 DIAGNOSIS — R269 Unspecified abnormalities of gait and mobility: Secondary | ICD-10-CM | POA: Diagnosis not present

## 2018-03-13 DIAGNOSIS — F0390 Unspecified dementia without behavioral disturbance: Secondary | ICD-10-CM | POA: Diagnosis not present

## 2018-04-03 DIAGNOSIS — F0390 Unspecified dementia without behavioral disturbance: Secondary | ICD-10-CM | POA: Diagnosis not present

## 2018-04-03 DIAGNOSIS — I739 Peripheral vascular disease, unspecified: Secondary | ICD-10-CM | POA: Diagnosis not present

## 2018-04-03 DIAGNOSIS — L84 Corns and callosities: Secondary | ICD-10-CM | POA: Diagnosis not present

## 2018-04-03 DIAGNOSIS — Q845 Enlarged and hypertrophic nails: Secondary | ICD-10-CM | POA: Diagnosis not present

## 2018-04-03 DIAGNOSIS — B351 Tinea unguium: Secondary | ICD-10-CM | POA: Diagnosis not present

## 2018-04-03 DIAGNOSIS — L603 Nail dystrophy: Secondary | ICD-10-CM | POA: Diagnosis not present

## 2018-04-03 DIAGNOSIS — E1165 Type 2 diabetes mellitus with hyperglycemia: Secondary | ICD-10-CM | POA: Diagnosis not present

## 2018-04-06 DIAGNOSIS — R269 Unspecified abnormalities of gait and mobility: Secondary | ICD-10-CM | POA: Diagnosis not present

## 2018-04-06 DIAGNOSIS — G309 Alzheimer's disease, unspecified: Secondary | ICD-10-CM | POA: Diagnosis not present

## 2018-04-06 DIAGNOSIS — M25561 Pain in right knee: Secondary | ICD-10-CM | POA: Diagnosis not present

## 2018-04-09 DIAGNOSIS — M1711 Unilateral primary osteoarthritis, right knee: Secondary | ICD-10-CM | POA: Diagnosis not present

## 2018-04-17 DIAGNOSIS — J449 Chronic obstructive pulmonary disease, unspecified: Secondary | ICD-10-CM | POA: Diagnosis not present

## 2018-04-17 DIAGNOSIS — F0390 Unspecified dementia without behavioral disturbance: Secondary | ICD-10-CM | POA: Diagnosis not present

## 2018-04-17 DIAGNOSIS — R0989 Other specified symptoms and signs involving the circulatory and respiratory systems: Secondary | ICD-10-CM | POA: Diagnosis not present

## 2018-04-17 DIAGNOSIS — R05 Cough: Secondary | ICD-10-CM | POA: Diagnosis not present

## 2018-04-30 DIAGNOSIS — H353221 Exudative age-related macular degeneration, left eye, with active choroidal neovascularization: Secondary | ICD-10-CM | POA: Diagnosis not present

## 2018-05-13 DIAGNOSIS — Z794 Long term (current) use of insulin: Secondary | ICD-10-CM | POA: Diagnosis not present

## 2018-05-13 DIAGNOSIS — F0390 Unspecified dementia without behavioral disturbance: Secondary | ICD-10-CM | POA: Diagnosis not present

## 2018-05-13 DIAGNOSIS — E1140 Type 2 diabetes mellitus with diabetic neuropathy, unspecified: Secondary | ICD-10-CM | POA: Diagnosis not present

## 2018-05-14 DIAGNOSIS — G309 Alzheimer's disease, unspecified: Secondary | ICD-10-CM | POA: Diagnosis not present

## 2018-05-14 DIAGNOSIS — L853 Xerosis cutis: Secondary | ICD-10-CM | POA: Diagnosis not present

## 2018-05-14 DIAGNOSIS — R21 Rash and other nonspecific skin eruption: Secondary | ICD-10-CM | POA: Diagnosis not present

## 2018-05-14 DIAGNOSIS — E119 Type 2 diabetes mellitus without complications: Secondary | ICD-10-CM | POA: Diagnosis not present

## 2018-05-15 DIAGNOSIS — F0390 Unspecified dementia without behavioral disturbance: Secondary | ICD-10-CM | POA: Diagnosis not present

## 2018-05-15 DIAGNOSIS — E1165 Type 2 diabetes mellitus with hyperglycemia: Secondary | ICD-10-CM | POA: Diagnosis not present

## 2018-05-15 DIAGNOSIS — R54 Age-related physical debility: Secondary | ICD-10-CM | POA: Diagnosis not present

## 2018-05-15 DIAGNOSIS — R799 Abnormal finding of blood chemistry, unspecified: Secondary | ICD-10-CM | POA: Diagnosis not present

## 2018-05-15 DIAGNOSIS — E119 Type 2 diabetes mellitus without complications: Secondary | ICD-10-CM | POA: Diagnosis not present

## 2018-06-04 DIAGNOSIS — H353221 Exudative age-related macular degeneration, left eye, with active choroidal neovascularization: Secondary | ICD-10-CM | POA: Diagnosis not present

## 2018-06-05 DIAGNOSIS — B351 Tinea unguium: Secondary | ICD-10-CM | POA: Diagnosis not present

## 2018-06-05 DIAGNOSIS — L603 Nail dystrophy: Secondary | ICD-10-CM | POA: Diagnosis not present

## 2018-06-05 DIAGNOSIS — I739 Peripheral vascular disease, unspecified: Secondary | ICD-10-CM | POA: Diagnosis not present

## 2018-06-05 DIAGNOSIS — Q845 Enlarged and hypertrophic nails: Secondary | ICD-10-CM | POA: Diagnosis not present

## 2018-06-12 DIAGNOSIS — F0390 Unspecified dementia without behavioral disturbance: Secondary | ICD-10-CM | POA: Diagnosis not present

## 2018-06-12 DIAGNOSIS — E1165 Type 2 diabetes mellitus with hyperglycemia: Secondary | ICD-10-CM | POA: Diagnosis not present

## 2018-06-24 DIAGNOSIS — Z794 Long term (current) use of insulin: Secondary | ICD-10-CM | POA: Diagnosis not present

## 2018-06-24 DIAGNOSIS — E1165 Type 2 diabetes mellitus with hyperglycemia: Secondary | ICD-10-CM | POA: Diagnosis not present

## 2018-06-24 DIAGNOSIS — F0390 Unspecified dementia without behavioral disturbance: Secondary | ICD-10-CM | POA: Diagnosis not present

## 2018-07-03 DIAGNOSIS — L89319 Pressure ulcer of right buttock, unspecified stage: Secondary | ICD-10-CM | POA: Diagnosis not present

## 2018-07-03 DIAGNOSIS — L304 Erythema intertrigo: Secondary | ICD-10-CM | POA: Diagnosis not present

## 2018-07-03 DIAGNOSIS — F0390 Unspecified dementia without behavioral disturbance: Secondary | ICD-10-CM | POA: Diagnosis not present

## 2018-07-03 DIAGNOSIS — L89320 Pressure ulcer of left buttock, unstageable: Secondary | ICD-10-CM | POA: Diagnosis not present

## 2018-07-03 DIAGNOSIS — B372 Candidiasis of skin and nail: Secondary | ICD-10-CM | POA: Diagnosis not present

## 2018-07-07 DIAGNOSIS — Z9181 History of falling: Secondary | ICD-10-CM | POA: Diagnosis not present

## 2018-07-07 DIAGNOSIS — Z87891 Personal history of nicotine dependence: Secondary | ICD-10-CM | POA: Diagnosis not present

## 2018-07-07 DIAGNOSIS — Z7901 Long term (current) use of anticoagulants: Secondary | ICD-10-CM | POA: Diagnosis not present

## 2018-07-07 DIAGNOSIS — I4891 Unspecified atrial fibrillation: Secondary | ICD-10-CM | POA: Diagnosis not present

## 2018-07-07 DIAGNOSIS — G309 Alzheimer's disease, unspecified: Secondary | ICD-10-CM | POA: Diagnosis not present

## 2018-07-07 DIAGNOSIS — M1991 Primary osteoarthritis, unspecified site: Secondary | ICD-10-CM | POA: Diagnosis not present

## 2018-07-07 DIAGNOSIS — F0280 Dementia in other diseases classified elsewhere without behavioral disturbance: Secondary | ICD-10-CM | POA: Diagnosis not present

## 2018-07-07 DIAGNOSIS — L89322 Pressure ulcer of left buttock, stage 2: Secondary | ICD-10-CM | POA: Diagnosis not present

## 2018-07-07 DIAGNOSIS — F419 Anxiety disorder, unspecified: Secondary | ICD-10-CM | POA: Diagnosis not present

## 2018-07-07 DIAGNOSIS — I509 Heart failure, unspecified: Secondary | ICD-10-CM | POA: Diagnosis not present

## 2018-07-07 DIAGNOSIS — K769 Liver disease, unspecified: Secondary | ICD-10-CM | POA: Diagnosis not present

## 2018-07-07 DIAGNOSIS — J9610 Chronic respiratory failure, unspecified whether with hypoxia or hypercapnia: Secondary | ICD-10-CM | POA: Diagnosis not present

## 2018-07-07 DIAGNOSIS — J449 Chronic obstructive pulmonary disease, unspecified: Secondary | ICD-10-CM | POA: Diagnosis not present

## 2018-07-07 DIAGNOSIS — D649 Anemia, unspecified: Secondary | ICD-10-CM | POA: Diagnosis not present

## 2018-07-07 DIAGNOSIS — F329 Major depressive disorder, single episode, unspecified: Secondary | ICD-10-CM | POA: Diagnosis not present

## 2018-07-07 DIAGNOSIS — I110 Hypertensive heart disease with heart failure: Secondary | ICD-10-CM | POA: Diagnosis not present

## 2018-07-07 DIAGNOSIS — E1165 Type 2 diabetes mellitus with hyperglycemia: Secondary | ICD-10-CM | POA: Diagnosis not present

## 2018-07-07 DIAGNOSIS — Z794 Long term (current) use of insulin: Secondary | ICD-10-CM | POA: Diagnosis not present

## 2018-07-10 DIAGNOSIS — I110 Hypertensive heart disease with heart failure: Secondary | ICD-10-CM | POA: Diagnosis not present

## 2018-07-10 DIAGNOSIS — G309 Alzheimer's disease, unspecified: Secondary | ICD-10-CM | POA: Diagnosis not present

## 2018-07-10 DIAGNOSIS — I509 Heart failure, unspecified: Secondary | ICD-10-CM | POA: Diagnosis not present

## 2018-07-10 DIAGNOSIS — F0280 Dementia in other diseases classified elsewhere without behavioral disturbance: Secondary | ICD-10-CM | POA: Diagnosis not present

## 2018-07-10 DIAGNOSIS — E1165 Type 2 diabetes mellitus with hyperglycemia: Secondary | ICD-10-CM | POA: Diagnosis not present

## 2018-07-10 DIAGNOSIS — L89322 Pressure ulcer of left buttock, stage 2: Secondary | ICD-10-CM | POA: Diagnosis not present

## 2018-07-13 DIAGNOSIS — L89322 Pressure ulcer of left buttock, stage 2: Secondary | ICD-10-CM | POA: Diagnosis not present

## 2018-07-13 DIAGNOSIS — I110 Hypertensive heart disease with heart failure: Secondary | ICD-10-CM | POA: Diagnosis not present

## 2018-07-13 DIAGNOSIS — F0280 Dementia in other diseases classified elsewhere without behavioral disturbance: Secondary | ICD-10-CM | POA: Diagnosis not present

## 2018-07-13 DIAGNOSIS — G309 Alzheimer's disease, unspecified: Secondary | ICD-10-CM | POA: Diagnosis not present

## 2018-07-13 DIAGNOSIS — E1165 Type 2 diabetes mellitus with hyperglycemia: Secondary | ICD-10-CM | POA: Diagnosis not present

## 2018-07-13 DIAGNOSIS — I509 Heart failure, unspecified: Secondary | ICD-10-CM | POA: Diagnosis not present

## 2018-07-16 DIAGNOSIS — H353221 Exudative age-related macular degeneration, left eye, with active choroidal neovascularization: Secondary | ICD-10-CM | POA: Diagnosis not present

## 2018-07-17 DIAGNOSIS — F0280 Dementia in other diseases classified elsewhere without behavioral disturbance: Secondary | ICD-10-CM | POA: Diagnosis not present

## 2018-07-17 DIAGNOSIS — I110 Hypertensive heart disease with heart failure: Secondary | ICD-10-CM | POA: Diagnosis not present

## 2018-07-17 DIAGNOSIS — G309 Alzheimer's disease, unspecified: Secondary | ICD-10-CM | POA: Diagnosis not present

## 2018-07-17 DIAGNOSIS — E1165 Type 2 diabetes mellitus with hyperglycemia: Secondary | ICD-10-CM | POA: Diagnosis not present

## 2018-07-17 DIAGNOSIS — L89322 Pressure ulcer of left buttock, stage 2: Secondary | ICD-10-CM | POA: Diagnosis not present

## 2018-07-17 DIAGNOSIS — I509 Heart failure, unspecified: Secondary | ICD-10-CM | POA: Diagnosis not present

## 2018-07-20 DIAGNOSIS — I110 Hypertensive heart disease with heart failure: Secondary | ICD-10-CM | POA: Diagnosis not present

## 2018-07-20 DIAGNOSIS — L89322 Pressure ulcer of left buttock, stage 2: Secondary | ICD-10-CM | POA: Diagnosis not present

## 2018-07-20 DIAGNOSIS — I509 Heart failure, unspecified: Secondary | ICD-10-CM | POA: Diagnosis not present

## 2018-07-20 DIAGNOSIS — G309 Alzheimer's disease, unspecified: Secondary | ICD-10-CM | POA: Diagnosis not present

## 2018-07-20 DIAGNOSIS — F0280 Dementia in other diseases classified elsewhere without behavioral disturbance: Secondary | ICD-10-CM | POA: Diagnosis not present

## 2018-07-20 DIAGNOSIS — E1165 Type 2 diabetes mellitus with hyperglycemia: Secondary | ICD-10-CM | POA: Diagnosis not present

## 2018-07-21 DIAGNOSIS — L299 Pruritus, unspecified: Secondary | ICD-10-CM | POA: Diagnosis not present

## 2018-07-21 DIAGNOSIS — B379 Candidiasis, unspecified: Secondary | ICD-10-CM | POA: Diagnosis not present

## 2018-07-21 DIAGNOSIS — F0390 Unspecified dementia without behavioral disturbance: Secondary | ICD-10-CM | POA: Diagnosis not present

## 2018-07-23 DIAGNOSIS — L89322 Pressure ulcer of left buttock, stage 2: Secondary | ICD-10-CM | POA: Diagnosis not present

## 2018-07-23 DIAGNOSIS — I110 Hypertensive heart disease with heart failure: Secondary | ICD-10-CM | POA: Diagnosis not present

## 2018-07-23 DIAGNOSIS — G309 Alzheimer's disease, unspecified: Secondary | ICD-10-CM | POA: Diagnosis not present

## 2018-07-23 DIAGNOSIS — I509 Heart failure, unspecified: Secondary | ICD-10-CM | POA: Diagnosis not present

## 2018-07-23 DIAGNOSIS — E1165 Type 2 diabetes mellitus with hyperglycemia: Secondary | ICD-10-CM | POA: Diagnosis not present

## 2018-07-23 DIAGNOSIS — F0280 Dementia in other diseases classified elsewhere without behavioral disturbance: Secondary | ICD-10-CM | POA: Diagnosis not present

## 2018-07-30 DIAGNOSIS — E1165 Type 2 diabetes mellitus with hyperglycemia: Secondary | ICD-10-CM | POA: Diagnosis not present

## 2018-07-30 DIAGNOSIS — I110 Hypertensive heart disease with heart failure: Secondary | ICD-10-CM | POA: Diagnosis not present

## 2018-07-30 DIAGNOSIS — L89322 Pressure ulcer of left buttock, stage 2: Secondary | ICD-10-CM | POA: Diagnosis not present

## 2018-07-30 DIAGNOSIS — G309 Alzheimer's disease, unspecified: Secondary | ICD-10-CM | POA: Diagnosis not present

## 2018-07-30 DIAGNOSIS — I509 Heart failure, unspecified: Secondary | ICD-10-CM | POA: Diagnosis not present

## 2018-07-30 DIAGNOSIS — F0280 Dementia in other diseases classified elsewhere without behavioral disturbance: Secondary | ICD-10-CM | POA: Diagnosis not present

## 2018-08-05 DIAGNOSIS — S72009D Fracture of unspecified part of neck of unspecified femur, subsequent encounter for closed fracture with routine healing: Secondary | ICD-10-CM | POA: Diagnosis not present

## 2018-08-05 DIAGNOSIS — N179 Acute kidney failure, unspecified: Secondary | ICD-10-CM | POA: Diagnosis not present

## 2018-08-05 DIAGNOSIS — W19XXXD Unspecified fall, subsequent encounter: Secondary | ICD-10-CM | POA: Diagnosis not present

## 2018-08-05 DIAGNOSIS — M2550 Pain in unspecified joint: Secondary | ICD-10-CM | POA: Diagnosis not present

## 2018-08-05 DIAGNOSIS — E119 Type 2 diabetes mellitus without complications: Secondary | ICD-10-CM | POA: Diagnosis not present

## 2018-08-05 DIAGNOSIS — N183 Chronic kidney disease, stage 3 (moderate): Secondary | ICD-10-CM | POA: Diagnosis not present

## 2018-08-05 DIAGNOSIS — R410 Disorientation, unspecified: Secondary | ICD-10-CM | POA: Diagnosis not present

## 2018-08-05 DIAGNOSIS — R918 Other nonspecific abnormal finding of lung field: Secondary | ICD-10-CM | POA: Diagnosis not present

## 2018-08-05 DIAGNOSIS — Z87891 Personal history of nicotine dependence: Secondary | ICD-10-CM | POA: Diagnosis not present

## 2018-08-05 DIAGNOSIS — Z794 Long term (current) use of insulin: Secondary | ICD-10-CM | POA: Diagnosis not present

## 2018-08-05 DIAGNOSIS — S72002A Fracture of unspecified part of neck of left femur, initial encounter for closed fracture: Secondary | ICD-10-CM | POA: Diagnosis not present

## 2018-08-05 DIAGNOSIS — M25552 Pain in left hip: Secondary | ICD-10-CM | POA: Diagnosis not present

## 2018-08-05 DIAGNOSIS — E1122 Type 2 diabetes mellitus with diabetic chronic kidney disease: Secondary | ICD-10-CM | POA: Diagnosis present

## 2018-08-05 DIAGNOSIS — M1990 Unspecified osteoarthritis, unspecified site: Secondary | ICD-10-CM | POA: Diagnosis present

## 2018-08-05 DIAGNOSIS — Z882 Allergy status to sulfonamides status: Secondary | ICD-10-CM | POA: Diagnosis not present

## 2018-08-05 DIAGNOSIS — K219 Gastro-esophageal reflux disease without esophagitis: Secondary | ICD-10-CM | POA: Diagnosis present

## 2018-08-05 DIAGNOSIS — I130 Hypertensive heart and chronic kidney disease with heart failure and stage 1 through stage 4 chronic kidney disease, or unspecified chronic kidney disease: Secondary | ICD-10-CM | POA: Diagnosis not present

## 2018-08-05 DIAGNOSIS — I10 Essential (primary) hypertension: Secondary | ICD-10-CM | POA: Diagnosis not present

## 2018-08-05 DIAGNOSIS — Z888 Allergy status to other drugs, medicaments and biological substances status: Secondary | ICD-10-CM | POA: Diagnosis not present

## 2018-08-05 DIAGNOSIS — F329 Major depressive disorder, single episode, unspecified: Secondary | ICD-10-CM | POA: Diagnosis present

## 2018-08-05 DIAGNOSIS — I509 Heart failure, unspecified: Secondary | ICD-10-CM | POA: Diagnosis not present

## 2018-08-05 DIAGNOSIS — E785 Hyperlipidemia, unspecified: Secondary | ICD-10-CM | POA: Diagnosis not present

## 2018-08-05 DIAGNOSIS — J9601 Acute respiratory failure with hypoxia: Secondary | ICD-10-CM | POA: Diagnosis not present

## 2018-08-05 DIAGNOSIS — I9581 Postprocedural hypotension: Secondary | ICD-10-CM | POA: Diagnosis not present

## 2018-08-05 DIAGNOSIS — F0390 Unspecified dementia without behavioral disturbance: Secondary | ICD-10-CM | POA: Diagnosis not present

## 2018-08-05 DIAGNOSIS — R0902 Hypoxemia: Secondary | ICD-10-CM | POA: Diagnosis not present

## 2018-08-05 DIAGNOSIS — W19XXXA Unspecified fall, initial encounter: Secondary | ICD-10-CM | POA: Diagnosis not present

## 2018-08-05 DIAGNOSIS — I482 Chronic atrial fibrillation: Secondary | ICD-10-CM | POA: Diagnosis not present

## 2018-08-05 DIAGNOSIS — I5032 Chronic diastolic (congestive) heart failure: Secondary | ICD-10-CM | POA: Diagnosis not present

## 2018-08-05 DIAGNOSIS — S7292XA Unspecified fracture of left femur, initial encounter for closed fracture: Secondary | ICD-10-CM | POA: Diagnosis not present

## 2018-08-05 DIAGNOSIS — Z66 Do not resuscitate: Secondary | ICD-10-CM | POA: Diagnosis present

## 2018-08-05 DIAGNOSIS — I4891 Unspecified atrial fibrillation: Secondary | ICD-10-CM | POA: Diagnosis not present

## 2018-08-05 DIAGNOSIS — S299XXA Unspecified injury of thorax, initial encounter: Secondary | ICD-10-CM | POA: Diagnosis not present

## 2018-08-05 DIAGNOSIS — Z7401 Bed confinement status: Secondary | ICD-10-CM | POA: Diagnosis not present

## 2018-08-05 DIAGNOSIS — Z79899 Other long term (current) drug therapy: Secondary | ICD-10-CM | POA: Diagnosis not present

## 2018-08-05 DIAGNOSIS — Z7901 Long term (current) use of anticoagulants: Secondary | ICD-10-CM | POA: Diagnosis not present

## 2018-08-05 DIAGNOSIS — R52 Pain, unspecified: Secondary | ICD-10-CM | POA: Diagnosis not present

## 2018-08-05 DIAGNOSIS — J189 Pneumonia, unspecified organism: Secondary | ICD-10-CM | POA: Diagnosis not present

## 2018-08-05 DIAGNOSIS — I2510 Atherosclerotic heart disease of native coronary artery without angina pectoris: Secondary | ICD-10-CM | POA: Diagnosis not present

## 2018-08-09 DIAGNOSIS — J189 Pneumonia, unspecified organism: Secondary | ICD-10-CM | POA: Diagnosis not present

## 2018-08-09 DIAGNOSIS — M2550 Pain in unspecified joint: Secondary | ICD-10-CM | POA: Diagnosis not present

## 2018-08-09 DIAGNOSIS — Z7401 Bed confinement status: Secondary | ICD-10-CM | POA: Diagnosis not present

## 2018-08-09 DIAGNOSIS — E785 Hyperlipidemia, unspecified: Secondary | ICD-10-CM | POA: Diagnosis not present

## 2018-08-09 DIAGNOSIS — R52 Pain, unspecified: Secondary | ICD-10-CM | POA: Diagnosis not present

## 2018-08-09 DIAGNOSIS — W19XXXD Unspecified fall, subsequent encounter: Secondary | ICD-10-CM | POA: Diagnosis not present

## 2018-08-09 DIAGNOSIS — R262 Difficulty in walking, not elsewhere classified: Secondary | ICD-10-CM | POA: Diagnosis not present

## 2018-08-09 DIAGNOSIS — W19XXXA Unspecified fall, initial encounter: Secondary | ICD-10-CM | POA: Diagnosis not present

## 2018-08-09 DIAGNOSIS — I482 Chronic atrial fibrillation: Secondary | ICD-10-CM | POA: Diagnosis not present

## 2018-08-09 DIAGNOSIS — F0390 Unspecified dementia without behavioral disturbance: Secondary | ICD-10-CM | POA: Diagnosis not present

## 2018-08-09 DIAGNOSIS — J9601 Acute respiratory failure with hypoxia: Secondary | ICD-10-CM | POA: Diagnosis not present

## 2018-08-09 DIAGNOSIS — D649 Anemia, unspecified: Secondary | ICD-10-CM | POA: Diagnosis not present

## 2018-08-09 DIAGNOSIS — I509 Heart failure, unspecified: Secondary | ICD-10-CM | POA: Diagnosis not present

## 2018-08-09 DIAGNOSIS — G8918 Other acute postprocedural pain: Secondary | ICD-10-CM | POA: Diagnosis not present

## 2018-08-09 DIAGNOSIS — S72009D Fracture of unspecified part of neck of unspecified femur, subsequent encounter for closed fracture with routine healing: Secondary | ICD-10-CM | POA: Diagnosis not present

## 2018-08-09 DIAGNOSIS — N183 Chronic kidney disease, stage 3 (moderate): Secondary | ICD-10-CM | POA: Diagnosis not present

## 2018-08-09 DIAGNOSIS — Z7901 Long term (current) use of anticoagulants: Secondary | ICD-10-CM | POA: Diagnosis not present

## 2018-08-09 DIAGNOSIS — E119 Type 2 diabetes mellitus without complications: Secondary | ICD-10-CM | POA: Diagnosis not present

## 2018-08-09 DIAGNOSIS — R410 Disorientation, unspecified: Secondary | ICD-10-CM | POA: Diagnosis not present

## 2018-08-09 DIAGNOSIS — S72002A Fracture of unspecified part of neck of left femur, initial encounter for closed fracture: Secondary | ICD-10-CM | POA: Diagnosis not present

## 2018-08-09 DIAGNOSIS — I10 Essential (primary) hypertension: Secondary | ICD-10-CM | POA: Diagnosis not present

## 2018-08-11 DIAGNOSIS — G8918 Other acute postprocedural pain: Secondary | ICD-10-CM | POA: Diagnosis not present

## 2018-08-11 DIAGNOSIS — D649 Anemia, unspecified: Secondary | ICD-10-CM | POA: Diagnosis not present

## 2018-08-11 DIAGNOSIS — R262 Difficulty in walking, not elsewhere classified: Secondary | ICD-10-CM | POA: Diagnosis not present

## 2018-08-11 DIAGNOSIS — S72009D Fracture of unspecified part of neck of unspecified femur, subsequent encounter for closed fracture with routine healing: Secondary | ICD-10-CM | POA: Diagnosis not present

## 2018-09-08 DIAGNOSIS — S72002A Fracture of unspecified part of neck of left femur, initial encounter for closed fracture: Secondary | ICD-10-CM | POA: Diagnosis not present

## 2018-09-11 DIAGNOSIS — S72009D Fracture of unspecified part of neck of unspecified femur, subsequent encounter for closed fracture with routine healing: Secondary | ICD-10-CM | POA: Diagnosis not present

## 2018-09-11 DIAGNOSIS — R2689 Other abnormalities of gait and mobility: Secondary | ICD-10-CM | POA: Diagnosis not present

## 2018-09-17 DIAGNOSIS — R4189 Other symptoms and signs involving cognitive functions and awareness: Secondary | ICD-10-CM | POA: Diagnosis not present

## 2018-09-17 DIAGNOSIS — M810 Age-related osteoporosis without current pathological fracture: Secondary | ICD-10-CM | POA: Diagnosis not present

## 2018-09-17 DIAGNOSIS — K5790 Diverticulosis of intestine, part unspecified, without perforation or abscess without bleeding: Secondary | ICD-10-CM | POA: Diagnosis not present

## 2018-09-17 DIAGNOSIS — S72002D Fracture of unspecified part of neck of left femur, subsequent encounter for closed fracture with routine healing: Secondary | ICD-10-CM | POA: Diagnosis not present

## 2018-09-17 DIAGNOSIS — R269 Unspecified abnormalities of gait and mobility: Secondary | ICD-10-CM | POA: Diagnosis not present

## 2018-09-17 DIAGNOSIS — L89312 Pressure ulcer of right buttock, stage 2: Secondary | ICD-10-CM | POA: Diagnosis not present

## 2018-09-17 DIAGNOSIS — I482 Chronic atrial fibrillation: Secondary | ICD-10-CM | POA: Diagnosis not present

## 2018-09-17 DIAGNOSIS — Z794 Long term (current) use of insulin: Secondary | ICD-10-CM | POA: Diagnosis not present

## 2018-09-17 DIAGNOSIS — Z8701 Personal history of pneumonia (recurrent): Secondary | ICD-10-CM | POA: Diagnosis not present

## 2018-09-17 DIAGNOSIS — F329 Major depressive disorder, single episode, unspecified: Secondary | ICD-10-CM | POA: Diagnosis not present

## 2018-09-17 DIAGNOSIS — Z7901 Long term (current) use of anticoagulants: Secondary | ICD-10-CM | POA: Diagnosis not present

## 2018-09-17 DIAGNOSIS — E1122 Type 2 diabetes mellitus with diabetic chronic kidney disease: Secondary | ICD-10-CM | POA: Diagnosis not present

## 2018-09-17 DIAGNOSIS — N183 Chronic kidney disease, stage 3 (moderate): Secondary | ICD-10-CM | POA: Diagnosis not present

## 2018-09-17 DIAGNOSIS — E785 Hyperlipidemia, unspecified: Secondary | ICD-10-CM | POA: Diagnosis not present

## 2018-09-17 DIAGNOSIS — I130 Hypertensive heart and chronic kidney disease with heart failure and stage 1 through stage 4 chronic kidney disease, or unspecified chronic kidney disease: Secondary | ICD-10-CM | POA: Diagnosis not present

## 2018-09-17 DIAGNOSIS — E1140 Type 2 diabetes mellitus with diabetic neuropathy, unspecified: Secondary | ICD-10-CM | POA: Diagnosis not present

## 2018-09-17 DIAGNOSIS — Z96642 Presence of left artificial hip joint: Secondary | ICD-10-CM | POA: Diagnosis not present

## 2018-09-17 DIAGNOSIS — I4891 Unspecified atrial fibrillation: Secondary | ICD-10-CM | POA: Diagnosis not present

## 2018-09-17 DIAGNOSIS — Z9181 History of falling: Secondary | ICD-10-CM | POA: Diagnosis not present

## 2018-09-17 DIAGNOSIS — F0390 Unspecified dementia without behavioral disturbance: Secondary | ICD-10-CM | POA: Diagnosis not present

## 2018-09-17 DIAGNOSIS — L89321 Pressure ulcer of left buttock, stage 1: Secondary | ICD-10-CM | POA: Diagnosis not present

## 2018-09-17 DIAGNOSIS — I509 Heart failure, unspecified: Secondary | ICD-10-CM | POA: Diagnosis not present

## 2018-09-17 DIAGNOSIS — M1991 Primary osteoarthritis, unspecified site: Secondary | ICD-10-CM | POA: Diagnosis not present

## 2018-09-17 DIAGNOSIS — I5033 Acute on chronic diastolic (congestive) heart failure: Secondary | ICD-10-CM | POA: Diagnosis not present

## 2018-09-17 DIAGNOSIS — D631 Anemia in chronic kidney disease: Secondary | ICD-10-CM | POA: Diagnosis not present

## 2018-09-17 DIAGNOSIS — Z87891 Personal history of nicotine dependence: Secondary | ICD-10-CM | POA: Diagnosis not present

## 2018-09-17 DIAGNOSIS — J449 Chronic obstructive pulmonary disease, unspecified: Secondary | ICD-10-CM | POA: Diagnosis not present

## 2018-09-18 DIAGNOSIS — L89312 Pressure ulcer of right buttock, stage 2: Secondary | ICD-10-CM | POA: Diagnosis not present

## 2018-09-18 DIAGNOSIS — E1122 Type 2 diabetes mellitus with diabetic chronic kidney disease: Secondary | ICD-10-CM | POA: Diagnosis not present

## 2018-09-18 DIAGNOSIS — I5033 Acute on chronic diastolic (congestive) heart failure: Secondary | ICD-10-CM | POA: Diagnosis not present

## 2018-09-18 DIAGNOSIS — L89321 Pressure ulcer of left buttock, stage 1: Secondary | ICD-10-CM | POA: Diagnosis not present

## 2018-09-18 DIAGNOSIS — I130 Hypertensive heart and chronic kidney disease with heart failure and stage 1 through stage 4 chronic kidney disease, or unspecified chronic kidney disease: Secondary | ICD-10-CM | POA: Diagnosis not present

## 2018-09-18 DIAGNOSIS — S72002D Fracture of unspecified part of neck of left femur, subsequent encounter for closed fracture with routine healing: Secondary | ICD-10-CM | POA: Diagnosis not present

## 2018-09-21 DIAGNOSIS — E1122 Type 2 diabetes mellitus with diabetic chronic kidney disease: Secondary | ICD-10-CM | POA: Diagnosis not present

## 2018-09-21 DIAGNOSIS — I5033 Acute on chronic diastolic (congestive) heart failure: Secondary | ICD-10-CM | POA: Diagnosis not present

## 2018-09-21 DIAGNOSIS — M545 Low back pain: Secondary | ICD-10-CM | POA: Diagnosis not present

## 2018-09-21 DIAGNOSIS — F0390 Unspecified dementia without behavioral disturbance: Secondary | ICD-10-CM | POA: Diagnosis not present

## 2018-09-21 DIAGNOSIS — L89312 Pressure ulcer of right buttock, stage 2: Secondary | ICD-10-CM | POA: Diagnosis not present

## 2018-09-21 DIAGNOSIS — S7292XA Unspecified fracture of left femur, initial encounter for closed fracture: Secondary | ICD-10-CM | POA: Diagnosis not present

## 2018-09-21 DIAGNOSIS — L89321 Pressure ulcer of left buttock, stage 1: Secondary | ICD-10-CM | POA: Diagnosis not present

## 2018-09-21 DIAGNOSIS — I130 Hypertensive heart and chronic kidney disease with heart failure and stage 1 through stage 4 chronic kidney disease, or unspecified chronic kidney disease: Secondary | ICD-10-CM | POA: Diagnosis not present

## 2018-09-21 DIAGNOSIS — E1129 Type 2 diabetes mellitus with other diabetic kidney complication: Secondary | ICD-10-CM | POA: Diagnosis not present

## 2018-09-21 DIAGNOSIS — S72002D Fracture of unspecified part of neck of left femur, subsequent encounter for closed fracture with routine healing: Secondary | ICD-10-CM | POA: Diagnosis not present

## 2018-09-22 DIAGNOSIS — Z79899 Other long term (current) drug therapy: Secondary | ICD-10-CM | POA: Diagnosis not present

## 2018-09-24 DIAGNOSIS — L89321 Pressure ulcer of left buttock, stage 1: Secondary | ICD-10-CM | POA: Diagnosis not present

## 2018-09-24 DIAGNOSIS — I5033 Acute on chronic diastolic (congestive) heart failure: Secondary | ICD-10-CM | POA: Diagnosis not present

## 2018-09-24 DIAGNOSIS — L89312 Pressure ulcer of right buttock, stage 2: Secondary | ICD-10-CM | POA: Diagnosis not present

## 2018-09-24 DIAGNOSIS — I130 Hypertensive heart and chronic kidney disease with heart failure and stage 1 through stage 4 chronic kidney disease, or unspecified chronic kidney disease: Secondary | ICD-10-CM | POA: Diagnosis not present

## 2018-09-24 DIAGNOSIS — S72002D Fracture of unspecified part of neck of left femur, subsequent encounter for closed fracture with routine healing: Secondary | ICD-10-CM | POA: Diagnosis not present

## 2018-09-24 DIAGNOSIS — E1122 Type 2 diabetes mellitus with diabetic chronic kidney disease: Secondary | ICD-10-CM | POA: Diagnosis not present

## 2018-09-25 DIAGNOSIS — L89321 Pressure ulcer of left buttock, stage 1: Secondary | ICD-10-CM | POA: Diagnosis not present

## 2018-09-25 DIAGNOSIS — L89312 Pressure ulcer of right buttock, stage 2: Secondary | ICD-10-CM | POA: Diagnosis not present

## 2018-09-25 DIAGNOSIS — I5033 Acute on chronic diastolic (congestive) heart failure: Secondary | ICD-10-CM | POA: Diagnosis not present

## 2018-09-25 DIAGNOSIS — I130 Hypertensive heart and chronic kidney disease with heart failure and stage 1 through stage 4 chronic kidney disease, or unspecified chronic kidney disease: Secondary | ICD-10-CM | POA: Diagnosis not present

## 2018-09-25 DIAGNOSIS — S72002D Fracture of unspecified part of neck of left femur, subsequent encounter for closed fracture with routine healing: Secondary | ICD-10-CM | POA: Diagnosis not present

## 2018-09-25 DIAGNOSIS — E1122 Type 2 diabetes mellitus with diabetic chronic kidney disease: Secondary | ICD-10-CM | POA: Diagnosis not present

## 2018-09-28 DIAGNOSIS — I4891 Unspecified atrial fibrillation: Secondary | ICD-10-CM | POA: Diagnosis not present

## 2018-09-28 DIAGNOSIS — R4189 Other symptoms and signs involving cognitive functions and awareness: Secondary | ICD-10-CM | POA: Diagnosis not present

## 2018-09-28 DIAGNOSIS — M549 Dorsalgia, unspecified: Secondary | ICD-10-CM | POA: Diagnosis not present

## 2018-09-28 DIAGNOSIS — R531 Weakness: Secondary | ICD-10-CM | POA: Diagnosis not present

## 2018-09-29 DIAGNOSIS — I130 Hypertensive heart and chronic kidney disease with heart failure and stage 1 through stage 4 chronic kidney disease, or unspecified chronic kidney disease: Secondary | ICD-10-CM | POA: Diagnosis not present

## 2018-09-29 DIAGNOSIS — E1122 Type 2 diabetes mellitus with diabetic chronic kidney disease: Secondary | ICD-10-CM | POA: Diagnosis not present

## 2018-09-29 DIAGNOSIS — L89321 Pressure ulcer of left buttock, stage 1: Secondary | ICD-10-CM | POA: Diagnosis not present

## 2018-09-29 DIAGNOSIS — S72002D Fracture of unspecified part of neck of left femur, subsequent encounter for closed fracture with routine healing: Secondary | ICD-10-CM | POA: Diagnosis not present

## 2018-09-29 DIAGNOSIS — L89312 Pressure ulcer of right buttock, stage 2: Secondary | ICD-10-CM | POA: Diagnosis not present

## 2018-09-29 DIAGNOSIS — I5033 Acute on chronic diastolic (congestive) heart failure: Secondary | ICD-10-CM | POA: Diagnosis not present

## 2018-09-30 DIAGNOSIS — S72002D Fracture of unspecified part of neck of left femur, subsequent encounter for closed fracture with routine healing: Secondary | ICD-10-CM | POA: Diagnosis not present

## 2018-09-30 DIAGNOSIS — L89321 Pressure ulcer of left buttock, stage 1: Secondary | ICD-10-CM | POA: Diagnosis not present

## 2018-09-30 DIAGNOSIS — I5033 Acute on chronic diastolic (congestive) heart failure: Secondary | ICD-10-CM | POA: Diagnosis not present

## 2018-09-30 DIAGNOSIS — I130 Hypertensive heart and chronic kidney disease with heart failure and stage 1 through stage 4 chronic kidney disease, or unspecified chronic kidney disease: Secondary | ICD-10-CM | POA: Diagnosis not present

## 2018-09-30 DIAGNOSIS — E1122 Type 2 diabetes mellitus with diabetic chronic kidney disease: Secondary | ICD-10-CM | POA: Diagnosis not present

## 2018-09-30 DIAGNOSIS — L89312 Pressure ulcer of right buttock, stage 2: Secondary | ICD-10-CM | POA: Diagnosis not present

## 2018-10-01 DIAGNOSIS — I5033 Acute on chronic diastolic (congestive) heart failure: Secondary | ICD-10-CM | POA: Diagnosis not present

## 2018-10-01 DIAGNOSIS — S72002D Fracture of unspecified part of neck of left femur, subsequent encounter for closed fracture with routine healing: Secondary | ICD-10-CM | POA: Diagnosis not present

## 2018-10-01 DIAGNOSIS — L89312 Pressure ulcer of right buttock, stage 2: Secondary | ICD-10-CM | POA: Diagnosis not present

## 2018-10-01 DIAGNOSIS — E1122 Type 2 diabetes mellitus with diabetic chronic kidney disease: Secondary | ICD-10-CM | POA: Diagnosis not present

## 2018-10-01 DIAGNOSIS — I130 Hypertensive heart and chronic kidney disease with heart failure and stage 1 through stage 4 chronic kidney disease, or unspecified chronic kidney disease: Secondary | ICD-10-CM | POA: Diagnosis not present

## 2018-10-01 DIAGNOSIS — L89321 Pressure ulcer of left buttock, stage 1: Secondary | ICD-10-CM | POA: Diagnosis not present

## 2018-10-02 DIAGNOSIS — I130 Hypertensive heart and chronic kidney disease with heart failure and stage 1 through stage 4 chronic kidney disease, or unspecified chronic kidney disease: Secondary | ICD-10-CM | POA: Diagnosis not present

## 2018-10-02 DIAGNOSIS — E1122 Type 2 diabetes mellitus with diabetic chronic kidney disease: Secondary | ICD-10-CM | POA: Diagnosis not present

## 2018-10-02 DIAGNOSIS — I5033 Acute on chronic diastolic (congestive) heart failure: Secondary | ICD-10-CM | POA: Diagnosis not present

## 2018-10-02 DIAGNOSIS — S72002D Fracture of unspecified part of neck of left femur, subsequent encounter for closed fracture with routine healing: Secondary | ICD-10-CM | POA: Diagnosis not present

## 2018-10-02 DIAGNOSIS — L89321 Pressure ulcer of left buttock, stage 1: Secondary | ICD-10-CM | POA: Diagnosis not present

## 2018-10-02 DIAGNOSIS — L89312 Pressure ulcer of right buttock, stage 2: Secondary | ICD-10-CM | POA: Diagnosis not present

## 2018-10-03 DIAGNOSIS — E1122 Type 2 diabetes mellitus with diabetic chronic kidney disease: Secondary | ICD-10-CM | POA: Diagnosis not present

## 2018-10-03 DIAGNOSIS — L89321 Pressure ulcer of left buttock, stage 1: Secondary | ICD-10-CM | POA: Diagnosis not present

## 2018-10-03 DIAGNOSIS — L89312 Pressure ulcer of right buttock, stage 2: Secondary | ICD-10-CM | POA: Diagnosis not present

## 2018-10-03 DIAGNOSIS — I130 Hypertensive heart and chronic kidney disease with heart failure and stage 1 through stage 4 chronic kidney disease, or unspecified chronic kidney disease: Secondary | ICD-10-CM | POA: Diagnosis not present

## 2018-10-03 DIAGNOSIS — I5033 Acute on chronic diastolic (congestive) heart failure: Secondary | ICD-10-CM | POA: Diagnosis not present

## 2018-10-03 DIAGNOSIS — S72002D Fracture of unspecified part of neck of left femur, subsequent encounter for closed fracture with routine healing: Secondary | ICD-10-CM | POA: Diagnosis not present

## 2018-10-05 DIAGNOSIS — S72002D Fracture of unspecified part of neck of left femur, subsequent encounter for closed fracture with routine healing: Secondary | ICD-10-CM | POA: Diagnosis not present

## 2018-10-05 DIAGNOSIS — I5033 Acute on chronic diastolic (congestive) heart failure: Secondary | ICD-10-CM | POA: Diagnosis not present

## 2018-10-05 DIAGNOSIS — E1122 Type 2 diabetes mellitus with diabetic chronic kidney disease: Secondary | ICD-10-CM | POA: Diagnosis not present

## 2018-10-05 DIAGNOSIS — L89312 Pressure ulcer of right buttock, stage 2: Secondary | ICD-10-CM | POA: Diagnosis not present

## 2018-10-05 DIAGNOSIS — I130 Hypertensive heart and chronic kidney disease with heart failure and stage 1 through stage 4 chronic kidney disease, or unspecified chronic kidney disease: Secondary | ICD-10-CM | POA: Diagnosis not present

## 2018-10-05 DIAGNOSIS — L89321 Pressure ulcer of left buttock, stage 1: Secondary | ICD-10-CM | POA: Diagnosis not present

## 2018-10-07 DIAGNOSIS — E1122 Type 2 diabetes mellitus with diabetic chronic kidney disease: Secondary | ICD-10-CM | POA: Diagnosis not present

## 2018-10-07 DIAGNOSIS — L89312 Pressure ulcer of right buttock, stage 2: Secondary | ICD-10-CM | POA: Diagnosis not present

## 2018-10-07 DIAGNOSIS — I5033 Acute on chronic diastolic (congestive) heart failure: Secondary | ICD-10-CM | POA: Diagnosis not present

## 2018-10-07 DIAGNOSIS — L89321 Pressure ulcer of left buttock, stage 1: Secondary | ICD-10-CM | POA: Diagnosis not present

## 2018-10-07 DIAGNOSIS — I130 Hypertensive heart and chronic kidney disease with heart failure and stage 1 through stage 4 chronic kidney disease, or unspecified chronic kidney disease: Secondary | ICD-10-CM | POA: Diagnosis not present

## 2018-10-07 DIAGNOSIS — S72002D Fracture of unspecified part of neck of left femur, subsequent encounter for closed fracture with routine healing: Secondary | ICD-10-CM | POA: Diagnosis not present

## 2018-10-08 DIAGNOSIS — I130 Hypertensive heart and chronic kidney disease with heart failure and stage 1 through stage 4 chronic kidney disease, or unspecified chronic kidney disease: Secondary | ICD-10-CM | POA: Diagnosis not present

## 2018-10-08 DIAGNOSIS — E1122 Type 2 diabetes mellitus with diabetic chronic kidney disease: Secondary | ICD-10-CM | POA: Diagnosis not present

## 2018-10-08 DIAGNOSIS — S72002D Fracture of unspecified part of neck of left femur, subsequent encounter for closed fracture with routine healing: Secondary | ICD-10-CM | POA: Diagnosis not present

## 2018-10-08 DIAGNOSIS — L89321 Pressure ulcer of left buttock, stage 1: Secondary | ICD-10-CM | POA: Diagnosis not present

## 2018-10-08 DIAGNOSIS — L89312 Pressure ulcer of right buttock, stage 2: Secondary | ICD-10-CM | POA: Diagnosis not present

## 2018-10-08 DIAGNOSIS — I5033 Acute on chronic diastolic (congestive) heart failure: Secondary | ICD-10-CM | POA: Diagnosis not present

## 2018-10-09 DIAGNOSIS — L89312 Pressure ulcer of right buttock, stage 2: Secondary | ICD-10-CM | POA: Diagnosis not present

## 2018-10-09 DIAGNOSIS — E1122 Type 2 diabetes mellitus with diabetic chronic kidney disease: Secondary | ICD-10-CM | POA: Diagnosis not present

## 2018-10-09 DIAGNOSIS — I130 Hypertensive heart and chronic kidney disease with heart failure and stage 1 through stage 4 chronic kidney disease, or unspecified chronic kidney disease: Secondary | ICD-10-CM | POA: Diagnosis not present

## 2018-10-09 DIAGNOSIS — L89321 Pressure ulcer of left buttock, stage 1: Secondary | ICD-10-CM | POA: Diagnosis not present

## 2018-10-09 DIAGNOSIS — I5033 Acute on chronic diastolic (congestive) heart failure: Secondary | ICD-10-CM | POA: Diagnosis not present

## 2018-10-09 DIAGNOSIS — S72002D Fracture of unspecified part of neck of left femur, subsequent encounter for closed fracture with routine healing: Secondary | ICD-10-CM | POA: Diagnosis not present

## 2018-10-12 DIAGNOSIS — L89312 Pressure ulcer of right buttock, stage 2: Secondary | ICD-10-CM | POA: Diagnosis not present

## 2018-10-12 DIAGNOSIS — L89321 Pressure ulcer of left buttock, stage 1: Secondary | ICD-10-CM | POA: Diagnosis not present

## 2018-10-12 DIAGNOSIS — I5033 Acute on chronic diastolic (congestive) heart failure: Secondary | ICD-10-CM | POA: Diagnosis not present

## 2018-10-12 DIAGNOSIS — S72002D Fracture of unspecified part of neck of left femur, subsequent encounter for closed fracture with routine healing: Secondary | ICD-10-CM | POA: Diagnosis not present

## 2018-10-12 DIAGNOSIS — I130 Hypertensive heart and chronic kidney disease with heart failure and stage 1 through stage 4 chronic kidney disease, or unspecified chronic kidney disease: Secondary | ICD-10-CM | POA: Diagnosis not present

## 2018-10-12 DIAGNOSIS — E1122 Type 2 diabetes mellitus with diabetic chronic kidney disease: Secondary | ICD-10-CM | POA: Diagnosis not present

## 2018-10-13 DIAGNOSIS — I5033 Acute on chronic diastolic (congestive) heart failure: Secondary | ICD-10-CM | POA: Diagnosis not present

## 2018-10-13 DIAGNOSIS — E1122 Type 2 diabetes mellitus with diabetic chronic kidney disease: Secondary | ICD-10-CM | POA: Diagnosis not present

## 2018-10-13 DIAGNOSIS — I130 Hypertensive heart and chronic kidney disease with heart failure and stage 1 through stage 4 chronic kidney disease, or unspecified chronic kidney disease: Secondary | ICD-10-CM | POA: Diagnosis not present

## 2018-10-13 DIAGNOSIS — I509 Heart failure, unspecified: Secondary | ICD-10-CM | POA: Diagnosis not present

## 2018-10-13 DIAGNOSIS — L89312 Pressure ulcer of right buttock, stage 2: Secondary | ICD-10-CM | POA: Diagnosis not present

## 2018-10-13 DIAGNOSIS — L89321 Pressure ulcer of left buttock, stage 1: Secondary | ICD-10-CM | POA: Diagnosis not present

## 2018-10-13 DIAGNOSIS — S72002D Fracture of unspecified part of neck of left femur, subsequent encounter for closed fracture with routine healing: Secondary | ICD-10-CM | POA: Diagnosis not present

## 2018-10-15 DIAGNOSIS — L89312 Pressure ulcer of right buttock, stage 2: Secondary | ICD-10-CM | POA: Diagnosis not present

## 2018-10-15 DIAGNOSIS — I5033 Acute on chronic diastolic (congestive) heart failure: Secondary | ICD-10-CM | POA: Diagnosis not present

## 2018-10-15 DIAGNOSIS — S72002D Fracture of unspecified part of neck of left femur, subsequent encounter for closed fracture with routine healing: Secondary | ICD-10-CM | POA: Diagnosis not present

## 2018-10-15 DIAGNOSIS — E1122 Type 2 diabetes mellitus with diabetic chronic kidney disease: Secondary | ICD-10-CM | POA: Diagnosis not present

## 2018-10-15 DIAGNOSIS — L89321 Pressure ulcer of left buttock, stage 1: Secondary | ICD-10-CM | POA: Diagnosis not present

## 2018-10-15 DIAGNOSIS — I130 Hypertensive heart and chronic kidney disease with heart failure and stage 1 through stage 4 chronic kidney disease, or unspecified chronic kidney disease: Secondary | ICD-10-CM | POA: Diagnosis not present

## 2018-10-20 DIAGNOSIS — E1122 Type 2 diabetes mellitus with diabetic chronic kidney disease: Secondary | ICD-10-CM | POA: Diagnosis not present

## 2018-10-20 DIAGNOSIS — I5033 Acute on chronic diastolic (congestive) heart failure: Secondary | ICD-10-CM | POA: Diagnosis not present

## 2018-10-20 DIAGNOSIS — L89321 Pressure ulcer of left buttock, stage 1: Secondary | ICD-10-CM | POA: Diagnosis not present

## 2018-10-20 DIAGNOSIS — I130 Hypertensive heart and chronic kidney disease with heart failure and stage 1 through stage 4 chronic kidney disease, or unspecified chronic kidney disease: Secondary | ICD-10-CM | POA: Diagnosis not present

## 2018-10-20 DIAGNOSIS — L89312 Pressure ulcer of right buttock, stage 2: Secondary | ICD-10-CM | POA: Diagnosis not present

## 2018-10-20 DIAGNOSIS — S72002D Fracture of unspecified part of neck of left femur, subsequent encounter for closed fracture with routine healing: Secondary | ICD-10-CM | POA: Diagnosis not present

## 2018-10-22 DIAGNOSIS — H353221 Exudative age-related macular degeneration, left eye, with active choroidal neovascularization: Secondary | ICD-10-CM | POA: Diagnosis not present

## 2018-10-23 DIAGNOSIS — L89312 Pressure ulcer of right buttock, stage 2: Secondary | ICD-10-CM | POA: Diagnosis not present

## 2018-10-23 DIAGNOSIS — S72002D Fracture of unspecified part of neck of left femur, subsequent encounter for closed fracture with routine healing: Secondary | ICD-10-CM | POA: Diagnosis not present

## 2018-10-23 DIAGNOSIS — L89321 Pressure ulcer of left buttock, stage 1: Secondary | ICD-10-CM | POA: Diagnosis not present

## 2018-10-23 DIAGNOSIS — E1122 Type 2 diabetes mellitus with diabetic chronic kidney disease: Secondary | ICD-10-CM | POA: Diagnosis not present

## 2018-10-23 DIAGNOSIS — I130 Hypertensive heart and chronic kidney disease with heart failure and stage 1 through stage 4 chronic kidney disease, or unspecified chronic kidney disease: Secondary | ICD-10-CM | POA: Diagnosis not present

## 2018-10-23 DIAGNOSIS — I5033 Acute on chronic diastolic (congestive) heart failure: Secondary | ICD-10-CM | POA: Diagnosis not present

## 2018-10-26 DIAGNOSIS — J42 Unspecified chronic bronchitis: Secondary | ICD-10-CM | POA: Diagnosis not present

## 2018-10-26 DIAGNOSIS — E0842 Diabetes mellitus due to underlying condition with diabetic polyneuropathy: Secondary | ICD-10-CM | POA: Diagnosis not present

## 2018-10-26 DIAGNOSIS — I5033 Acute on chronic diastolic (congestive) heart failure: Secondary | ICD-10-CM | POA: Diagnosis not present

## 2018-10-26 DIAGNOSIS — G894 Chronic pain syndrome: Secondary | ICD-10-CM | POA: Diagnosis not present

## 2018-10-26 DIAGNOSIS — S72002D Fracture of unspecified part of neck of left femur, subsequent encounter for closed fracture with routine healing: Secondary | ICD-10-CM | POA: Diagnosis not present

## 2018-10-26 DIAGNOSIS — F0280 Dementia in other diseases classified elsewhere without behavioral disturbance: Secondary | ICD-10-CM | POA: Diagnosis not present

## 2018-10-26 DIAGNOSIS — K5901 Slow transit constipation: Secondary | ICD-10-CM | POA: Diagnosis not present

## 2018-10-26 DIAGNOSIS — E1122 Type 2 diabetes mellitus with diabetic chronic kidney disease: Secondary | ICD-10-CM | POA: Diagnosis not present

## 2018-10-26 DIAGNOSIS — I130 Hypertensive heart and chronic kidney disease with heart failure and stage 1 through stage 4 chronic kidney disease, or unspecified chronic kidney disease: Secondary | ICD-10-CM | POA: Diagnosis not present

## 2018-10-26 DIAGNOSIS — L89312 Pressure ulcer of right buttock, stage 2: Secondary | ICD-10-CM | POA: Diagnosis not present

## 2018-10-26 DIAGNOSIS — L89321 Pressure ulcer of left buttock, stage 1: Secondary | ICD-10-CM | POA: Diagnosis not present

## 2018-10-26 DIAGNOSIS — Z794 Long term (current) use of insulin: Secondary | ICD-10-CM | POA: Diagnosis not present

## 2018-10-27 DIAGNOSIS — L89312 Pressure ulcer of right buttock, stage 2: Secondary | ICD-10-CM | POA: Diagnosis not present

## 2018-10-27 DIAGNOSIS — Z7901 Long term (current) use of anticoagulants: Secondary | ICD-10-CM | POA: Diagnosis not present

## 2018-10-27 DIAGNOSIS — I130 Hypertensive heart and chronic kidney disease with heart failure and stage 1 through stage 4 chronic kidney disease, or unspecified chronic kidney disease: Secondary | ICD-10-CM | POA: Diagnosis not present

## 2018-10-27 DIAGNOSIS — E1122 Type 2 diabetes mellitus with diabetic chronic kidney disease: Secondary | ICD-10-CM | POA: Diagnosis not present

## 2018-10-27 DIAGNOSIS — L89321 Pressure ulcer of left buttock, stage 1: Secondary | ICD-10-CM | POA: Diagnosis not present

## 2018-10-27 DIAGNOSIS — S72002D Fracture of unspecified part of neck of left femur, subsequent encounter for closed fracture with routine healing: Secondary | ICD-10-CM | POA: Diagnosis not present

## 2018-10-27 DIAGNOSIS — I5033 Acute on chronic diastolic (congestive) heart failure: Secondary | ICD-10-CM | POA: Diagnosis not present

## 2018-10-29 DIAGNOSIS — L89312 Pressure ulcer of right buttock, stage 2: Secondary | ICD-10-CM | POA: Diagnosis not present

## 2018-10-29 DIAGNOSIS — E1122 Type 2 diabetes mellitus with diabetic chronic kidney disease: Secondary | ICD-10-CM | POA: Diagnosis not present

## 2018-10-29 DIAGNOSIS — S72002D Fracture of unspecified part of neck of left femur, subsequent encounter for closed fracture with routine healing: Secondary | ICD-10-CM | POA: Diagnosis not present

## 2018-10-29 DIAGNOSIS — L89321 Pressure ulcer of left buttock, stage 1: Secondary | ICD-10-CM | POA: Diagnosis not present

## 2018-10-29 DIAGNOSIS — I130 Hypertensive heart and chronic kidney disease with heart failure and stage 1 through stage 4 chronic kidney disease, or unspecified chronic kidney disease: Secondary | ICD-10-CM | POA: Diagnosis not present

## 2018-10-29 DIAGNOSIS — I5033 Acute on chronic diastolic (congestive) heart failure: Secondary | ICD-10-CM | POA: Diagnosis not present

## 2018-10-30 DIAGNOSIS — Z23 Encounter for immunization: Secondary | ICD-10-CM | POA: Diagnosis not present

## 2018-11-03 DIAGNOSIS — E1122 Type 2 diabetes mellitus with diabetic chronic kidney disease: Secondary | ICD-10-CM | POA: Diagnosis not present

## 2018-11-03 DIAGNOSIS — S72002D Fracture of unspecified part of neck of left femur, subsequent encounter for closed fracture with routine healing: Secondary | ICD-10-CM | POA: Diagnosis not present

## 2018-11-03 DIAGNOSIS — L89321 Pressure ulcer of left buttock, stage 1: Secondary | ICD-10-CM | POA: Diagnosis not present

## 2018-11-03 DIAGNOSIS — I130 Hypertensive heart and chronic kidney disease with heart failure and stage 1 through stage 4 chronic kidney disease, or unspecified chronic kidney disease: Secondary | ICD-10-CM | POA: Diagnosis not present

## 2018-11-03 DIAGNOSIS — I5033 Acute on chronic diastolic (congestive) heart failure: Secondary | ICD-10-CM | POA: Diagnosis not present

## 2018-11-03 DIAGNOSIS — L89312 Pressure ulcer of right buttock, stage 2: Secondary | ICD-10-CM | POA: Diagnosis not present

## 2018-11-04 DIAGNOSIS — I130 Hypertensive heart and chronic kidney disease with heart failure and stage 1 through stage 4 chronic kidney disease, or unspecified chronic kidney disease: Secondary | ICD-10-CM | POA: Diagnosis not present

## 2018-11-04 DIAGNOSIS — L89312 Pressure ulcer of right buttock, stage 2: Secondary | ICD-10-CM | POA: Diagnosis not present

## 2018-11-04 DIAGNOSIS — S72002D Fracture of unspecified part of neck of left femur, subsequent encounter for closed fracture with routine healing: Secondary | ICD-10-CM | POA: Diagnosis not present

## 2018-11-04 DIAGNOSIS — I5033 Acute on chronic diastolic (congestive) heart failure: Secondary | ICD-10-CM | POA: Diagnosis not present

## 2018-11-04 DIAGNOSIS — E1122 Type 2 diabetes mellitus with diabetic chronic kidney disease: Secondary | ICD-10-CM | POA: Diagnosis not present

## 2018-11-04 DIAGNOSIS — L89321 Pressure ulcer of left buttock, stage 1: Secondary | ICD-10-CM | POA: Diagnosis not present

## 2018-11-05 DIAGNOSIS — I5033 Acute on chronic diastolic (congestive) heart failure: Secondary | ICD-10-CM | POA: Diagnosis not present

## 2018-11-05 DIAGNOSIS — S72002D Fracture of unspecified part of neck of left femur, subsequent encounter for closed fracture with routine healing: Secondary | ICD-10-CM | POA: Diagnosis not present

## 2018-11-05 DIAGNOSIS — I130 Hypertensive heart and chronic kidney disease with heart failure and stage 1 through stage 4 chronic kidney disease, or unspecified chronic kidney disease: Secondary | ICD-10-CM | POA: Diagnosis not present

## 2018-11-05 DIAGNOSIS — L89312 Pressure ulcer of right buttock, stage 2: Secondary | ICD-10-CM | POA: Diagnosis not present

## 2018-11-05 DIAGNOSIS — E1122 Type 2 diabetes mellitus with diabetic chronic kidney disease: Secondary | ICD-10-CM | POA: Diagnosis not present

## 2018-11-05 DIAGNOSIS — L89321 Pressure ulcer of left buttock, stage 1: Secondary | ICD-10-CM | POA: Diagnosis not present

## 2018-11-09 DIAGNOSIS — I129 Hypertensive chronic kidney disease with stage 1 through stage 4 chronic kidney disease, or unspecified chronic kidney disease: Secondary | ICD-10-CM | POA: Diagnosis not present

## 2018-11-09 DIAGNOSIS — N183 Chronic kidney disease, stage 3 (moderate): Secondary | ICD-10-CM | POA: Diagnosis not present

## 2018-11-09 DIAGNOSIS — G894 Chronic pain syndrome: Secondary | ICD-10-CM | POA: Diagnosis not present

## 2018-11-09 DIAGNOSIS — E1129 Type 2 diabetes mellitus with other diabetic kidney complication: Secondary | ICD-10-CM | POA: Diagnosis not present

## 2018-11-10 DIAGNOSIS — L89312 Pressure ulcer of right buttock, stage 2: Secondary | ICD-10-CM | POA: Diagnosis not present

## 2018-11-10 DIAGNOSIS — I130 Hypertensive heart and chronic kidney disease with heart failure and stage 1 through stage 4 chronic kidney disease, or unspecified chronic kidney disease: Secondary | ICD-10-CM | POA: Diagnosis not present

## 2018-11-10 DIAGNOSIS — L89321 Pressure ulcer of left buttock, stage 1: Secondary | ICD-10-CM | POA: Diagnosis not present

## 2018-11-10 DIAGNOSIS — I5033 Acute on chronic diastolic (congestive) heart failure: Secondary | ICD-10-CM | POA: Diagnosis not present

## 2018-11-10 DIAGNOSIS — S72002D Fracture of unspecified part of neck of left femur, subsequent encounter for closed fracture with routine healing: Secondary | ICD-10-CM | POA: Diagnosis not present

## 2018-11-10 DIAGNOSIS — E1122 Type 2 diabetes mellitus with diabetic chronic kidney disease: Secondary | ICD-10-CM | POA: Diagnosis not present

## 2018-11-13 DIAGNOSIS — E1122 Type 2 diabetes mellitus with diabetic chronic kidney disease: Secondary | ICD-10-CM | POA: Diagnosis not present

## 2018-11-13 DIAGNOSIS — L89312 Pressure ulcer of right buttock, stage 2: Secondary | ICD-10-CM | POA: Diagnosis not present

## 2018-11-13 DIAGNOSIS — I5033 Acute on chronic diastolic (congestive) heart failure: Secondary | ICD-10-CM | POA: Diagnosis not present

## 2018-11-13 DIAGNOSIS — S72002D Fracture of unspecified part of neck of left femur, subsequent encounter for closed fracture with routine healing: Secondary | ICD-10-CM | POA: Diagnosis not present

## 2018-11-13 DIAGNOSIS — I130 Hypertensive heart and chronic kidney disease with heart failure and stage 1 through stage 4 chronic kidney disease, or unspecified chronic kidney disease: Secondary | ICD-10-CM | POA: Diagnosis not present

## 2018-11-13 DIAGNOSIS — L89321 Pressure ulcer of left buttock, stage 1: Secondary | ICD-10-CM | POA: Diagnosis not present

## 2018-11-17 DIAGNOSIS — Z7901 Long term (current) use of anticoagulants: Secondary | ICD-10-CM | POA: Diagnosis not present

## 2018-11-24 DIAGNOSIS — H353221 Exudative age-related macular degeneration, left eye, with active choroidal neovascularization: Secondary | ICD-10-CM | POA: Diagnosis not present

## 2018-11-24 DIAGNOSIS — B351 Tinea unguium: Secondary | ICD-10-CM | POA: Diagnosis not present

## 2018-11-24 DIAGNOSIS — I739 Peripheral vascular disease, unspecified: Secondary | ICD-10-CM | POA: Diagnosis not present

## 2018-11-24 DIAGNOSIS — E0842 Diabetes mellitus due to underlying condition with diabetic polyneuropathy: Secondary | ICD-10-CM | POA: Diagnosis not present

## 2018-12-08 DIAGNOSIS — Z7901 Long term (current) use of anticoagulants: Secondary | ICD-10-CM | POA: Diagnosis not present

## 2018-12-15 DIAGNOSIS — S72002A Fracture of unspecified part of neck of left femur, initial encounter for closed fracture: Secondary | ICD-10-CM | POA: Diagnosis not present

## 2018-12-15 DIAGNOSIS — S72009A Fracture of unspecified part of neck of unspecified femur, initial encounter for closed fracture: Secondary | ICD-10-CM | POA: Diagnosis not present

## 2018-12-29 DIAGNOSIS — Z7901 Long term (current) use of anticoagulants: Secondary | ICD-10-CM | POA: Diagnosis not present

## 2018-12-29 DIAGNOSIS — H353221 Exudative age-related macular degeneration, left eye, with active choroidal neovascularization: Secondary | ICD-10-CM | POA: Diagnosis not present

## 2018-12-29 DIAGNOSIS — Z79899 Other long term (current) drug therapy: Secondary | ICD-10-CM | POA: Diagnosis not present

## 2019-01-02 DIAGNOSIS — R531 Weakness: Secondary | ICD-10-CM | POA: Diagnosis not present

## 2019-01-26 DIAGNOSIS — H353113 Nonexudative age-related macular degeneration, right eye, advanced atrophic without subfoveal involvement: Secondary | ICD-10-CM | POA: Diagnosis not present

## 2019-01-26 DIAGNOSIS — H353221 Exudative age-related macular degeneration, left eye, with active choroidal neovascularization: Secondary | ICD-10-CM | POA: Diagnosis not present

## 2019-01-26 DIAGNOSIS — H029 Unspecified disorder of eyelid: Secondary | ICD-10-CM | POA: Diagnosis not present

## 2019-01-28 DIAGNOSIS — N183 Chronic kidney disease, stage 3 (moderate): Secondary | ICD-10-CM | POA: Diagnosis not present

## 2019-01-28 DIAGNOSIS — I739 Peripheral vascular disease, unspecified: Secondary | ICD-10-CM | POA: Diagnosis not present

## 2019-01-28 DIAGNOSIS — B351 Tinea unguium: Secondary | ICD-10-CM | POA: Diagnosis not present

## 2019-01-28 DIAGNOSIS — E0842 Diabetes mellitus due to underlying condition with diabetic polyneuropathy: Secondary | ICD-10-CM | POA: Diagnosis not present

## 2019-02-02 DIAGNOSIS — Z7901 Long term (current) use of anticoagulants: Secondary | ICD-10-CM | POA: Diagnosis not present

## 2019-02-22 DIAGNOSIS — F0390 Unspecified dementia without behavioral disturbance: Secondary | ICD-10-CM | POA: Diagnosis not present

## 2019-02-22 DIAGNOSIS — B372 Candidiasis of skin and nail: Secondary | ICD-10-CM | POA: Diagnosis not present

## 2019-02-22 DIAGNOSIS — I129 Hypertensive chronic kidney disease with stage 1 through stage 4 chronic kidney disease, or unspecified chronic kidney disease: Secondary | ICD-10-CM | POA: Diagnosis not present

## 2019-02-22 DIAGNOSIS — N183 Chronic kidney disease, stage 3 (moderate): Secondary | ICD-10-CM | POA: Diagnosis not present

## 2019-02-22 DIAGNOSIS — E1121 Type 2 diabetes mellitus with diabetic nephropathy: Secondary | ICD-10-CM | POA: Diagnosis not present

## 2019-03-01 DIAGNOSIS — E1121 Type 2 diabetes mellitus with diabetic nephropathy: Secondary | ICD-10-CM | POA: Diagnosis not present

## 2019-03-01 DIAGNOSIS — N183 Chronic kidney disease, stage 3 (moderate): Secondary | ICD-10-CM | POA: Diagnosis not present

## 2019-03-01 DIAGNOSIS — I4891 Unspecified atrial fibrillation: Secondary | ICD-10-CM | POA: Diagnosis not present

## 2019-03-01 DIAGNOSIS — I130 Hypertensive heart and chronic kidney disease with heart failure and stage 1 through stage 4 chronic kidney disease, or unspecified chronic kidney disease: Secondary | ICD-10-CM | POA: Diagnosis not present

## 2019-03-01 DIAGNOSIS — M62838 Other muscle spasm: Secondary | ICD-10-CM | POA: Diagnosis not present

## 2019-03-01 DIAGNOSIS — I509 Heart failure, unspecified: Secondary | ICD-10-CM | POA: Diagnosis not present

## 2019-03-01 DIAGNOSIS — Z7901 Long term (current) use of anticoagulants: Secondary | ICD-10-CM | POA: Diagnosis not present

## 2019-03-09 DIAGNOSIS — Z7901 Long term (current) use of anticoagulants: Secondary | ICD-10-CM | POA: Diagnosis not present

## 2019-03-23 DIAGNOSIS — I4891 Unspecified atrial fibrillation: Secondary | ICD-10-CM | POA: Diagnosis not present

## 2019-03-23 DIAGNOSIS — Z7901 Long term (current) use of anticoagulants: Secondary | ICD-10-CM | POA: Diagnosis not present

## 2019-03-31 DIAGNOSIS — E0842 Diabetes mellitus due to underlying condition with diabetic polyneuropathy: Secondary | ICD-10-CM | POA: Diagnosis not present

## 2019-03-31 DIAGNOSIS — I739 Peripheral vascular disease, unspecified: Secondary | ICD-10-CM | POA: Diagnosis not present

## 2019-03-31 DIAGNOSIS — N183 Chronic kidney disease, stage 3 (moderate): Secondary | ICD-10-CM | POA: Diagnosis not present

## 2019-03-31 DIAGNOSIS — B351 Tinea unguium: Secondary | ICD-10-CM | POA: Diagnosis not present

## 2019-04-22 DIAGNOSIS — E1100 Type 2 diabetes mellitus with hyperosmolarity without nonketotic hyperglycemic-hyperosmolar coma (NKHHC): Secondary | ICD-10-CM | POA: Diagnosis not present

## 2019-04-22 DIAGNOSIS — Z79899 Other long term (current) drug therapy: Secondary | ICD-10-CM | POA: Diagnosis not present

## 2019-04-25 ENCOUNTER — Emergency Department (HOSPITAL_COMMUNITY): Payer: Medicare Other

## 2019-04-25 ENCOUNTER — Emergency Department (HOSPITAL_COMMUNITY)
Admission: EM | Admit: 2019-04-25 | Discharge: 2019-04-25 | Disposition: A | Discharge status: Home / Self Care | Payer: Medicare Other | Attending: Emergency Medicine | Admitting: Emergency Medicine

## 2019-04-25 ENCOUNTER — Other Ambulatory Visit: Payer: Self-pay

## 2019-04-25 ENCOUNTER — Encounter (HOSPITAL_COMMUNITY): Payer: Self-pay

## 2019-04-25 DIAGNOSIS — S299XXA Unspecified injury of thorax, initial encounter: Secondary | ICD-10-CM | POA: Diagnosis not present

## 2019-04-25 DIAGNOSIS — Z1159 Encounter for screening for other viral diseases: Secondary | ICD-10-CM | POA: Diagnosis not present

## 2019-04-25 DIAGNOSIS — S0083XA Contusion of other part of head, initial encounter: Secondary | ICD-10-CM | POA: Insufficient documentation

## 2019-04-25 DIAGNOSIS — S199XXA Unspecified injury of neck, initial encounter: Secondary | ICD-10-CM | POA: Diagnosis not present

## 2019-04-25 DIAGNOSIS — R0902 Hypoxemia: Secondary | ICD-10-CM | POA: Diagnosis not present

## 2019-04-25 DIAGNOSIS — R404 Transient alteration of awareness: Secondary | ICD-10-CM | POA: Diagnosis not present

## 2019-04-25 DIAGNOSIS — F0390 Unspecified dementia without behavioral disturbance: Secondary | ICD-10-CM | POA: Insufficient documentation

## 2019-04-25 DIAGNOSIS — Z79899 Other long term (current) drug therapy: Secondary | ICD-10-CM | POA: Insufficient documentation

## 2019-04-25 DIAGNOSIS — Z743 Need for continuous supervision: Secondary | ICD-10-CM | POA: Diagnosis not present

## 2019-04-25 DIAGNOSIS — E119 Type 2 diabetes mellitus without complications: Secondary | ICD-10-CM | POA: Insufficient documentation

## 2019-04-25 DIAGNOSIS — S0003XA Contusion of scalp, initial encounter: Secondary | ICD-10-CM | POA: Diagnosis not present

## 2019-04-25 DIAGNOSIS — S79911A Unspecified injury of right hip, initial encounter: Secondary | ICD-10-CM | POA: Diagnosis not present

## 2019-04-25 DIAGNOSIS — I4891 Unspecified atrial fibrillation: Secondary | ICD-10-CM | POA: Diagnosis not present

## 2019-04-25 DIAGNOSIS — W19XXXA Unspecified fall, initial encounter: Secondary | ICD-10-CM | POA: Diagnosis not present

## 2019-04-25 DIAGNOSIS — Z7901 Long term (current) use of anticoagulants: Secondary | ICD-10-CM | POA: Insufficient documentation

## 2019-04-25 DIAGNOSIS — Y9301 Activity, walking, marching and hiking: Secondary | ICD-10-CM | POA: Diagnosis not present

## 2019-04-25 DIAGNOSIS — R52 Pain, unspecified: Secondary | ICD-10-CM | POA: Diagnosis not present

## 2019-04-25 DIAGNOSIS — Y92129 Unspecified place in nursing home as the place of occurrence of the external cause: Secondary | ICD-10-CM | POA: Diagnosis not present

## 2019-04-25 DIAGNOSIS — Z03818 Encounter for observation for suspected exposure to other biological agents ruled out: Secondary | ICD-10-CM | POA: Diagnosis not present

## 2019-04-25 DIAGNOSIS — W010XXA Fall on same level from slipping, tripping and stumbling without subsequent striking against object, initial encounter: Secondary | ICD-10-CM | POA: Diagnosis not present

## 2019-04-25 DIAGNOSIS — I509 Heart failure, unspecified: Secondary | ICD-10-CM | POA: Diagnosis not present

## 2019-04-25 DIAGNOSIS — S0990XA Unspecified injury of head, initial encounter: Secondary | ICD-10-CM | POA: Diagnosis not present

## 2019-04-25 DIAGNOSIS — R279 Unspecified lack of coordination: Secondary | ICD-10-CM | POA: Diagnosis not present

## 2019-04-25 DIAGNOSIS — Z794 Long term (current) use of insulin: Secondary | ICD-10-CM | POA: Insufficient documentation

## 2019-04-25 DIAGNOSIS — R609 Edema, unspecified: Secondary | ICD-10-CM | POA: Diagnosis not present

## 2019-04-25 DIAGNOSIS — Y999 Unspecified external cause status: Secondary | ICD-10-CM | POA: Insufficient documentation

## 2019-04-25 DIAGNOSIS — I10 Essential (primary) hypertension: Secondary | ICD-10-CM | POA: Diagnosis not present

## 2019-04-25 HISTORY — DX: Unspecified atrial fibrillation: I48.91

## 2019-04-25 HISTORY — DX: Type 2 diabetes mellitus without complications: E11.9

## 2019-04-25 HISTORY — DX: Heart failure, unspecified: I50.9

## 2019-04-25 LAB — COMPREHENSIVE METABOLIC PANEL
ALT: 17 U/L (ref 0–44)
AST: 24 U/L (ref 15–41)
Albumin: 3.7 g/dL (ref 3.5–5.0)
Alkaline Phosphatase: 83 U/L (ref 38–126)
Anion gap: 12 (ref 5–15)
BUN: 23 mg/dL (ref 8–23)
CO2: 24 mmol/L (ref 22–32)
Calcium: 9.6 mg/dL (ref 8.9–10.3)
Chloride: 105 mmol/L (ref 98–111)
Creatinine, Ser: 1.21 mg/dL — ABNORMAL HIGH (ref 0.44–1.00)
GFR calc Af Amer: 48 mL/min — ABNORMAL LOW (ref >60)
GFR calc non Af Amer: 42 mL/min — ABNORMAL LOW (ref >60)
Glucose, Bld: 136 mg/dL — ABNORMAL HIGH (ref 70–99)
Potassium: 4 mmol/L (ref 3.5–5.1)
Sodium: 141 mmol/L (ref 135–145)
Total Bilirubin: 0.5 mg/dL (ref 0.3–1.2)
Total Protein: 7.1 g/dL (ref 6.5–8.1)

## 2019-04-25 LAB — CBC WITH DIFFERENTIAL/PLATELET
Abs Immature Granulocytes: 0.09 10*3/uL — ABNORMAL HIGH (ref 0.00–0.07)
Basophils Absolute: 0 10*3/uL (ref 0.0–0.1)
Basophils Relative: 0 %
Eosinophils Absolute: 0.1 10*3/uL (ref 0.0–0.5)
Eosinophils Relative: 1 %
HCT: 44.6 % (ref 36.0–46.0)
Hemoglobin: 14.5 g/dL (ref 12.0–15.0)
Immature Granulocytes: 1 %
Lymphocytes Relative: 14 %
Lymphs Abs: 1 10*3/uL (ref 0.7–4.0)
MCH: 30.1 pg (ref 26.0–34.0)
MCHC: 32.5 g/dL (ref 30.0–36.0)
MCV: 92.5 fL (ref 80.0–100.0)
Monocytes Absolute: 0.5 10*3/uL (ref 0.1–1.0)
Monocytes Relative: 7 %
Neutro Abs: 5.3 10*3/uL (ref 1.7–7.7)
Neutrophils Relative %: 77 %
Platelets: 230 10*3/uL (ref 150–400)
RBC: 4.82 MIL/uL (ref 3.87–5.11)
RDW: 14.9 % (ref 11.5–15.5)
WBC: 7 10*3/uL (ref 4.0–10.5)
nRBC: 0 % (ref 0.0–0.2)

## 2019-04-25 LAB — APTT: aPTT: 48 seconds — ABNORMAL HIGH (ref 24–36)

## 2019-04-25 LAB — PROTIME-INR
INR: 2.5 — ABNORMAL HIGH (ref 0.8–1.2)
Prothrombin Time: 27 seconds — ABNORMAL HIGH (ref 11.4–15.2)

## 2019-04-25 LAB — SARS CORONAVIRUS 2 BY RT PCR (HOSPITAL ORDER, PERFORMED IN ~~LOC~~ HOSPITAL LAB): SARS Coronavirus 2: NEGATIVE

## 2019-04-25 MED ORDER — FUROSEMIDE 20 MG PO TABS
40.0000 mg | ORAL_TABLET | Freq: Two times a day (BID) | ORAL | Status: DC
  Administered 2019-04-25: 40 mg via ORAL
  Filled 2019-04-25: qty 2

## 2019-04-25 MED ORDER — DILTIAZEM HCL ER COATED BEADS 120 MG PO CP24
120.0000 mg | ORAL_CAPSULE | Freq: Every day | ORAL | Status: DC
  Administered 2019-04-25: 11:00:00 120 mg via ORAL
  Filled 2019-04-25: qty 1

## 2019-04-25 NOTE — ED Triage Notes (Signed)
Pt BIB [facility] from [facility]. Pt had a fall this morning that was witnessed by staff at the facility. Per EMS patient fell backwards while ambulating and hit her head when she fell. No LOC, swelling noted to the back of the patient's head. Pt is confused at baseline with dementia, per staff patient was acting appropriately following the fall. Pt takes warfarin.

## 2019-04-25 NOTE — ED Notes (Signed)
Patient given discharge instructions and follow up information. Pt given the opportunity to ask questions. Pt verbalized understanding. Report and discharge instructions given to PTAR. Pt discharged from the ED.

## 2019-04-25 NOTE — ED Provider Notes (Signed)
[facility]   CSN: [id number] Arrival date & time: 04/25/19  [id number]  History   Chief Complaint Fall  HPI Carlyn ReichertMary Zamora is a 84 y.o. female with past medical history significant for persistent Afib, COPD, dementia who presents for evaluation after mechanical witnessed fall at her living facility.  Fall witnessed by facility staff.  Patient was walking without her walker which she normally walks with at baseline, and she tripped over her feet causing her to fall backwards hitting posterior aspect of her scalp. Also uses staff assist with ambulation. No LOC. She is on Warfarin for persistent A. Fib.  Patient does have dementia and is confused at baseline. Per facility, patient is at baseline mentality.  Patient does have difficulty walking at baseline as well. Patient has not had her morning medications.  LEVEL 5 caveat-- Dementia  Patient is a DNR. Gold paper at bedside.  History provided by facility and EMS.  [id number]: Attempted to contacts Daughter, Merlene PullingDarlene Daft. Unable to contact to give update on patient.     HPI  Past Medical History:  Diagnosis Date   Atrial fibrillation (HCC)    CHF (congestive heart failure) (HCC)    Diabetes mellitus without complication (HCC)     There are no active problems to display for this patient.   History reviewed.   OB History   No obstetric history on file.      Home Medications    Prior to Admission medications   Medication Sig Start Date End Date Taking? Authorizing Provider  acetaminophen (TYLENOL) 500 MG tablet Take 500 mg by mouth 3 (three) times daily.   Yes [provider]  BREO ELLIPTA 100-25 MCG/INH AEPB Inhale 1 puff into the lungs daily. Rinse mouth after use 04/11/19  Yes [provider]  Chloroxylenol-Zinc Oxide (BAZA EX) Apply 1 application topically as needed. Apply after brief chance   Yes [provider]  cholecalciferol (VITAMIN D) 25 MCG  (1000 UT) tablet Take 2,000 Units by mouth daily.   Yes [provider]  diltiazem (CARDIZEM CD) 120 MG 24 hr capsule Take 120 mg by mouth daily. 04/17/19  Yes [provider]  donepezil (ARICEPT) 10 MG tablet Take 10 mg by mouth daily. 04/11/19  Yes [provider]  ferrous sulfate 325 (65 FE) MG tablet Take 325 mg by mouth 2 (two) times daily with a meal.   Yes [provider]  furosemide (LASIX) 40 MG tablet Take 40 mg by mouth 2 (two) times a day. 04/07/19  Yes [provider]  gabapentin (NEURONTIN) 100 MG capsule Take 100-200 mg by mouth See admin instructions. Take 100 mg in the morning and 200 mg in the evening 04/21/19  Yes [provider]  guaifenesin (ROBITUSSIN) 100 MG/5ML syrup Take 200 mg by mouth 3 (three) times daily as needed for cough.   Yes [provider]  ipratropium-albuterol (DUONEB) 0.5-2.5 (3) MG/3ML SOLN Take 3 mLs by nebulization every 2 (two) hours as needed (Shortness of breath).   Yes [provider]  LANTUS SOLOSTAR 100 UNIT/ML Solostar Pen Inject 22 Units into the skin every evening. 04/07/19  Yes [provider]  Lidocaine 4 % PTCH Apply 1 patch topically daily. Apply every morning and remove every evening   Yes [provider]  memantine (NAMENDA XR) 28 MG CP24 24 hr capsule Take 28 mg by mouth every evening. 04/17/19  Yes [provider]  Menthol-Zinc Oxide (CALMOSEPTINE) 0.44-20.6 % OINT  Apply 1 application topically every 8 (eight) hours as needed (itching).   Yes [provider]  metoprolol tartrate (LOPRESSOR) 25 MG tablet Take 25 mg by mouth 2 (two) times a day. 04/17/19  Yes [provider]  mirtazapine (REMERON) 15 MG tablet Take 15 mg by mouth at bedtime. 03/01/19  Yes [provider]  NYSTATIN powder Apply 1 ampule topically daily. 04/12/19  Yes [provider]  omeprazole (PRILOSEC) 20 MG capsule Take 20 mg by mouth daily. 04/14/19   Yes [provider]  potassium chloride SA (K-DUR) 20 MEQ tablet Take 20 mEq by mouth 2 (two) times daily.  04/17/19  Yes [provider]  Psyllium 400 MG CAPS Take 400 mg by mouth at bedtime.   Yes [provider]  senna-docusate (SENOKOT-S) 8.6-50 MG tablet Take 1 tablet by mouth See admin instructions. Take three times a day on Monday, Wednesday and Friday in the evening   Yes [provider]  sertraline (ZOLOFT) 25 MG tablet Take 25 mg by mouth daily. 04/07/19  Yes [provider]  traZODone (DESYREL) 50 MG tablet Take 50 mg by mouth at bedtime. 04/17/19  Yes [provider]  trolamine salicylate (ASPERCREME) 10 % cream Apply 1 application topically as needed for muscle pain.   Yes [provider]  warfarin (COUMADIN) 2 MG tablet Take 2 mg by mouth See admin instructions. Take every Monday 04/11/19  Yes [provider]  warfarin (COUMADIN) 3 MG tablet Take 3 mg by mouth See admin instructions. Take Sunday, Tuesday, Wednesday Thursday, Friday and Saturday 04/15/19  Yes [provider]    Family History No family history on file.  Social History Social History   Tobacco Use   Smoking status: Not on file  Substance Use Topics   Alcohol use: Not on file   Drug use: Not on file   Allergies   Lisinopril; Niacin and related; and Sulfa antibiotics  Review of Systems Review of Systems  Unable to perform ROS: Dementia   Physical Exam Updated Vital Signs BP 134/81    Pulse 69    Temp 98.4 F (36.9 C) (Oral)    Resp 15    Ht 5' (1.524 m)    Wt 77.1 kg    SpO2 95%    BMI 33.20 kg/m   Physical Exam  Physical Exam  Constitutional: Pt is oriented to person, place, and time. Pt appears well-developed and well-nourished. No distress.  HENT:  Head: Normocephalic. Golf ball size hematoma to Parietal/occipital region of scalp. No lacerations Mouth/Throat: Oropharynx is clear and moist.  Eyes: Conjunctivae and EOM  are normal. Pupils are equal, round, and reactive to light. No scleral icterus. No evidence of orbital entrapment. No horizontal, vertical or rotational nystagmus  Neck: Normal range of motion. Neck supple.  Patient in C-Collar. No tenderness to C-spine. No step-offs. Cardiovascular: Normal rate, regular rhythm and intact distal pulses.   Pulmonary/Chest: Effort normal and breath sounds normal. No respiratory distress. Pt has no wheezes. No rales.  Abdominal: Soft. Bowel sounds are normal. There is no tenderness. There is no rebound and no guarding.  Musculoskeletal: Normal range of motion.  Mild tenderness to T2/T3 region. Moves all 4 extremities without difficulty.  She does have difficulty with range of motion to her right hip. Baseline vs Acute right hip fx? She has no shortening or rotation of her legs. Lymphadenopathy:    No cervical adenopathy.  Neurological: Pt. is alert and oriented to person, place,  and time. He has normal reflexes. No cranial nerve deficit.  Exhibits normal muscle tone. Coordination normal.  Mental Status:  Alert, oriented to person. Not place or time. At baseline per facility. Speech fluent without evidence of aphasia. Able to follow 2 step commands without difficulty.  Cranial Nerves:  II:  Peripheral visual fields grossly normal, pupils equal, round, reactive to light III,IV, VI: ptosis not present, extra-ocular motions intact bilaterally  V,VII: smile symmetric, facial light touch sensation equal VIII: hearing grossly normal bilaterally  IX,X: midline uvula rise  XI: bilateral shoulder shrug equal and strong XII: midline tongue extension  Motor:  4+/5 in upper and lower extremities bilaterally including strong and equal grip strength and dorsiflexion/plantar flexion Sensory: Pinprick and light touch normal in all extremities.  Deep Tendon Reflexes: 2+ and symmetric  Cerebellar: normal finger-to-nose with bilateral upper extremities Gait: normal gait and  balance CV: distal pulses palpable throughout   Skin: Skin is warm and dry. No rash noted. Pt is not diaphoretic.  Psychiatric: Pt has a normal mood and affect. Behavior is normal. Judgment and thought content normal.  Nursing note and vitals reviewed. ED Treatments / Results  Labs (all labs ordered are listed, but only abnormal results are displayed) Labs Reviewed  CBC WITH DIFFERENTIAL/PLATELET - Abnormal; Notable for the following components:      Result Value   Abs Immature Granulocytes 0.09 (*)    All other components within normal limits  COMPREHENSIVE METABOLIC PANEL - Abnormal; Notable for the following components:   Glucose, Bld 136 (*)    Creatinine, Ser 1.21 (*)    GFR calc non Af Amer 42 (*)    GFR calc Af Amer 48 (*)    All other components within normal limits  PROTIME-INR - Abnormal; Notable for the following components:   Prothrombin Time 27.0 (*)    INR 2.5 (*)    All other components within normal limits  APTT - Abnormal; Notable for the following components:   aPTT 48 (*)    All other components within normal limits  SARS CORONAVIRUS 2 (HOSPITAL ORDER, PERFORMED IN [facility])    EKG EKG Interpretation  Date/Time:  Sunday Apr 25 2019 08:26:04 EDT Ventricular Rate:  85 PR Interval:    QRS Duration: 87 QT Interval:  391 QTC Calculation: 465 R Axis:   35 Text Interpretation:  Atrial fibrillation Low voltage, extremity and precordial leads No old tracing to compare Confirmed by Raeford Razor [phone]) on 04/25/2019 9:02:26 AM Also confirmed by Raeford Razor [phone]), editor [place], Tamera Punt ([id number])  on 04/25/2019 9:54:41 AM   Radiology Dg Chest 2 View  Result Date: 04/25/2019 CLINICAL DATA:  Fall. EXAM: CHEST - 2 VIEW COMPARISON:  None. FINDINGS: The heart size and mediastinal contours are within normal limits. Atherosclerotic calcification of the aortic arch. Normal pulmonary vascularity. Streaky atelectasis in the right middle lobe and both lower  lobes. No focal consolidation, pleural effusion, or pneumothorax. Age indeterminate mild T10 superior endplate compression fracture. IMPRESSION: 1.  No active cardiopulmonary disease. 2. Age-indeterminate mild T10 superior endplate compression fracture. Correlate with point tenderness. Electronically Signed   By: Obie Dredge M.D.   On: 04/25/2019 09:47   Dg Thoracic Spine 2 View  Result Date: 04/25/2019 CLINICAL DATA:  Fall. EXAM: THORACIC SPINE 2 VIEWS COMPARISON:  None. FINDINGS: Twelve rib-bearing thoracic vertebral bodies. Age indeterminate mild C7 and T10 superior endplate compression fractures. Remaining vertebral body heights are preserved. Alignment is normal. Intervertebral disc spaces are  relatively maintained. Osteopenia. IMPRESSION: 1. Age-indeterminate mild C7 and T10 compression fractures. Correlate with point tenderness. Electronically Signed   By: Obie Dredge M.D.   On: 04/25/2019 09:43   Ct Head Wo Contrast  Result Date: 04/25/2019 CLINICAL DATA:  Fall backwards with posterior head injury. EXAM: CT HEAD WITHOUT CONTRAST CT CERVICAL SPINE WITHOUT CONTRAST TECHNIQUE: Multidetector CT imaging of the head and cervical spine was performed following the standard protocol without intravenous contrast. Multiplanar CT image reconstructions of the cervical spine were also generated. COMPARISON:  None. FINDINGS: CT HEAD FINDINGS Brain: No evidence of parenchymal hemorrhage or extra-axial fluid collection. No mass lesion, mass effect, or midline shift. No CT evidence of acute infarction. Generalized cerebral volume loss. Nonspecific moderate subcortical and periventricular white matter hypodensity, most in keeping with chronic small vessel ischemic change. Cerebral ventricle sizes are concordant with the degree of cerebral volume loss. Vascular: No acute abnormality. Skull: No evidence of calvarial fracture. Sinuses/Orbits: The visualized paranasal sinuses are essentially clear. Large right  posterior parietal scalp hematoma. Other:  The mastoid air cells are unopacified. CT CERVICAL SPINE FINDINGS Alignment: Straightening of the cervical spine. No facet subluxation. Dens is well positioned between the lateral masses of C1. Mild 3 mm anterolisthesis C4-5. Skull base and vertebrae: Mild C7 vertebral compression fracture of uncertain chronicity. No additional fracture. No primary bone lesion or focal pathologic process. Soft tissues and spinal canal: No prevertebral edema. No visible canal hematoma. Disc levels: Moderate to severe multilevel degenerative disc disease in the cervical spine, most prominent at C5-6. Moderate to severe bilateral multilevel facet arthropathy. Mild degenerative foraminal stenosis on the right at C3-4, on the left at C4-5, on the right at C5-6 and on the left at C6-7. Upper chest: No acute abnormality. Other: Visualized mastoid air cells appear clear. No discrete thyroid nodules. No pathologically enlarged cervical nodes. IMPRESSION: 1. Large right parietal scalp hematoma. 2. No evidence of acute intracranial abnormality. No evidence of calvarial fracture. 3. Generalized cerebral volume loss and moderate chronic small vessel ischemic changes in the cerebral white matter. 4. Mild C7 vertebral compression fracture of uncertain chronicity, favor chronic given absence of prevertebral soft tissue swelling. 5. No additional cervical spine fracture.  No facet subluxation. 6. Moderate to severe multilevel degenerative changes in the cervical spine as detailed. Electronically Signed   By: Delbert Phenix M.D.   On: 04/25/2019 09:21   Ct Cervical Spine Wo Contrast  Result Date: 04/25/2019 CLINICAL DATA:  Fall backwards with posterior head injury. EXAM: CT HEAD WITHOUT CONTRAST CT CERVICAL SPINE WITHOUT CONTRAST TECHNIQUE: Multidetector CT imaging of the head and cervical spine was performed following the standard protocol without intravenous contrast. Multiplanar CT image reconstructions  of the cervical spine were also generated. COMPARISON:  None. FINDINGS: CT HEAD FINDINGS Brain: No evidence of parenchymal hemorrhage or extra-axial fluid collection. No mass lesion, mass effect, or midline shift. No CT evidence of acute infarction. Generalized cerebral volume loss. Nonspecific moderate subcortical and periventricular white matter hypodensity, most in keeping with chronic small vessel ischemic change. Cerebral ventricle sizes are concordant with the degree of cerebral volume loss. Vascular: No acute abnormality. Skull: No evidence of calvarial fracture. Sinuses/Orbits: The visualized paranasal sinuses are essentially clear. Large right posterior parietal scalp hematoma. Other:  The mastoid air cells are unopacified. CT CERVICAL SPINE FINDINGS Alignment: Straightening of the cervical spine. No facet subluxation. Dens is well positioned between the lateral masses of C1. Mild 3 mm anterolisthesis C4-5. Skull base and vertebrae: Mild  C7 vertebral compression fracture of uncertain chronicity. No additional fracture. No primary bone lesion or focal pathologic process. Soft tissues and spinal canal: No prevertebral edema. No visible canal hematoma. Disc levels: Moderate to severe multilevel degenerative disc disease in the cervical spine, most prominent at C5-6. Moderate to severe bilateral multilevel facet arthropathy. Mild degenerative foraminal stenosis on the right at C3-4, on the left at C4-5, on the right at C5-6 and on the left at C6-7. Upper chest: No acute abnormality. Other: Visualized mastoid air cells appear clear. No discrete thyroid nodules. No pathologically enlarged cervical nodes. IMPRESSION: 1. Large right parietal scalp hematoma. 2. No evidence of acute intracranial abnormality. No evidence of calvarial fracture. 3. Generalized cerebral volume loss and moderate chronic small vessel ischemic changes in the cerebral white matter. 4. Mild C7 vertebral compression fracture of uncertain  chronicity, favor chronic given absence of prevertebral soft tissue swelling. 5. No additional cervical spine fracture.  No facet subluxation. 6. Moderate to severe multilevel degenerative changes in the cervical spine as detailed. Electronically Signed   By: Delbert Phenix M.D.   On: 04/25/2019 09:21   Dg Hip Unilat W Or Wo Pelvis 2-3 Views Right  Result Date: 04/25/2019 CLINICAL DATA:  Fall. EXAM: DG HIP (WITH OR WITHOUT PELVIS) 2-3V RIGHT COMPARISON:  None. FINDINGS: No acute fracture or dislocation. The pubic symphysis and sacroiliac joints are intact. Prior left hip arthroplasty. Osteopenia. Soft tissues are unremarkable. Vascular calcifications. IMPRESSION: No acute osseous abnormality. Electronically Signed   By: Obie Dredge M.D.   On: 04/25/2019 09:39    Procedures Procedures (including critical care time)  Medications Ordered in ED Medications  diltiazem (CARDIZEM CD) 24 hr capsule 120 mg (120 mg Oral Given 04/25/19 1036)  furosemide (LASIX) tablet 40 mg (40 mg Oral Given 04/25/19 1035)    Initial Impression / Assessment and Plan / ED Course  I have reviewed the triage vital signs and the nursing notes.  Pertinent labs & imaging results that were available during my care of the patient were reviewed by me and considered in my medical decision making (see chart for details).  84 year old female appears otherwise well presents for evaluation after witnessed mechanical fall at living facility.  Afebrile, nonseptic, non-ill-appearing.  Patient was not walking with her walker which she uses at baseline.  Tripped over her feet, per nursing facility which caused her to fall backwards onto her posterior head.  She does have a golf ball size hematoma to her posterior scalp.  No lacerations.  She denies LOC, however she is on warfarin for chronic atrial fibrillation.  She has no complaints currently.  She is neurovascularly intact without focal neuro deficets.  She has a normal musculoskeletal exam,  however has difficulty with range of motion to bilateral lower extremities, worse on right greater than left. Unsure if this is her limited mobility at baseline versus possible right hip fracture, however doe snot have shortening or rotation of legs.  Will obtain x-ray to assess for fracture.  Will also obtain head CT, cervical CT to assess for intracranial bleed given anticoagulation.  She does have midline thoracic tenderness over T2/T3 so will obtain DG thoracic .  Labs and Imaging personally reviewed: CBC: No leukocytosis, Hgb 14.5 CMP: creatinine 1.21, no previous to compare, no additional acute electrolyte, renal or liver abnormalities INR: 2.5, therapeutic CT head: Right parietal scalp hematoma, No bleed or midline shift, No acute infarcts CT cervical: Possible compression fracture over C7, age indeterminate however likely chronic  given lack of prevertebral swelling. No tenderness over C7 clinically  DG Thoracic: Possible mild T10 compression fracture, age inderterminent. No tenderness on exam. Low suspicion for acute fracture. DG Chest: No Cardiopulmonary disease DG right hip/ Pelvis: No acute findings. Ambulatory without difficulty, low suspicion for occult fracture. EKG: Without ST/T changes. No STEMI.  Patient with HTN on recheck however has not taken her home meds. Will give home meds. Patient also pulling at BP cuff due to agitation. Low suspicion for HTN emergency or urgency at this time. Patient tolerating PO intake without difficulty. Able to ambulate without difficulty in ED. Repeat BP 134/81 prior to HTN meds.  Patient is hemodynamically stable and in no acute distress.  Patient able to ambulate in department prior to ED with staff assist which she uses at baseline.  Evaluation does not show acute pathology that would require ongoing or additional emergent interventions while in the emergency department or further inpatient treatment.  I have discussed the diagnosis with the patient  and answered all questions. Multiple attempt to contact family to update on disposition without success. Patient is stable for discharge at this time. I have discussed strict return precautions for returning to the emergency department.  Patient was encouraged to follow-up with PCP refer to at discharge.  Patient has been discussed with Dr. Juleen China attending physician who is agreeable with plan to dc home.     Final Clinical Impressions(s) / ED Diagnoses   Final diagnoses:  Fall, initial encounter  Hematoma of right parietal scalp, initial encounter    ED Discharge Orders    None       Kasheem Toner A, PA-C 04/25/19 1100    Raeford Razor, MD 04/25/19 1152

## 2019-04-25 NOTE — Discharge Instructions (Signed)
Given Lasix and Cardizem for BP. CT negative. Keep ice to area on head. Return to the ED for any new or worsening symptoms.  Tylenol as needed for pain.

## 2019-04-25 NOTE — ED Notes (Signed)
Pt ambulated in room, took a couple steps then wanted to sit back down. With assistance of two staff members.

## 2019-04-26 DIAGNOSIS — F0280 Dementia in other diseases classified elsewhere without behavioral disturbance: Secondary | ICD-10-CM | POA: Diagnosis not present

## 2019-04-26 DIAGNOSIS — Z7901 Long term (current) use of anticoagulants: Secondary | ICD-10-CM | POA: Diagnosis not present

## 2019-04-26 DIAGNOSIS — G894 Chronic pain syndrome: Secondary | ICD-10-CM | POA: Diagnosis not present

## 2019-04-26 DIAGNOSIS — I4891 Unspecified atrial fibrillation: Secondary | ICD-10-CM | POA: Diagnosis not present

## 2019-04-26 DIAGNOSIS — F331 Major depressive disorder, recurrent, moderate: Secondary | ICD-10-CM | POA: Diagnosis not present

## 2019-04-26 DIAGNOSIS — R296 Repeated falls: Secondary | ICD-10-CM | POA: Diagnosis not present

## 2019-04-26 DIAGNOSIS — T50905A Adverse effect of unspecified drugs, medicaments and biological substances, initial encounter: Secondary | ICD-10-CM | POA: Diagnosis not present

## 2019-04-27 DIAGNOSIS — Z79899 Other long term (current) drug therapy: Secondary | ICD-10-CM | POA: Diagnosis not present

## 2019-04-27 DIAGNOSIS — E1100 Type 2 diabetes mellitus with hyperosmolarity without nonketotic hyperglycemic-hyperosmolar coma (NKHHC): Secondary | ICD-10-CM | POA: Diagnosis not present

## 2019-05-11 DIAGNOSIS — R0602 Shortness of breath: Secondary | ICD-10-CM | POA: Diagnosis not present

## 2019-05-11 DIAGNOSIS — Z139 Encounter for screening, unspecified: Secondary | ICD-10-CM | POA: Diagnosis not present

## 2019-05-17 DIAGNOSIS — I129 Hypertensive chronic kidney disease with stage 1 through stage 4 chronic kidney disease, or unspecified chronic kidney disease: Secondary | ICD-10-CM | POA: Diagnosis not present

## 2019-05-17 DIAGNOSIS — N183 Chronic kidney disease, stage 3 (moderate): Secondary | ICD-10-CM | POA: Diagnosis not present

## 2019-05-17 DIAGNOSIS — E1121 Type 2 diabetes mellitus with diabetic nephropathy: Secondary | ICD-10-CM | POA: Diagnosis not present

## 2019-05-17 DIAGNOSIS — S0003XD Contusion of scalp, subsequent encounter: Secondary | ICD-10-CM | POA: Diagnosis not present

## 2019-05-17 DIAGNOSIS — F0390 Unspecified dementia without behavioral disturbance: Secondary | ICD-10-CM | POA: Diagnosis not present

## 2019-06-02 DIAGNOSIS — B351 Tinea unguium: Secondary | ICD-10-CM | POA: Diagnosis not present

## 2019-06-02 DIAGNOSIS — I739 Peripheral vascular disease, unspecified: Secondary | ICD-10-CM | POA: Diagnosis not present

## 2019-06-02 DIAGNOSIS — N183 Chronic kidney disease, stage 3 (moderate): Secondary | ICD-10-CM | POA: Diagnosis not present

## 2019-06-02 DIAGNOSIS — E0842 Diabetes mellitus due to underlying condition with diabetic polyneuropathy: Secondary | ICD-10-CM | POA: Diagnosis not present

## 2019-06-21 ENCOUNTER — Inpatient Hospital Stay (HOSPITAL_COMMUNITY)
Admission: EM | Admit: 2019-06-21 | Discharge: 2019-06-26 | DRG: 493 | Disposition: A | Discharge status: Other Acute Inpatient Hospital | Payer: Medicare Other | Source: Skilled Nursing Facility | Attending: Student | Admitting: Student

## 2019-06-21 ENCOUNTER — Emergency Department (HOSPITAL_COMMUNITY): Payer: Medicare Other

## 2019-06-21 ENCOUNTER — Encounter (HOSPITAL_COMMUNITY): Payer: Self-pay | Admitting: Family Medicine

## 2019-06-21 DIAGNOSIS — S82401A Unspecified fracture of shaft of right fibula, initial encounter for closed fracture: Secondary | ICD-10-CM | POA: Diagnosis not present

## 2019-06-21 DIAGNOSIS — Y92092 Bedroom in other non-institutional residence as the place of occurrence of the external cause: Secondary | ICD-10-CM | POA: Diagnosis not present

## 2019-06-21 DIAGNOSIS — S82451A Displaced comminuted fracture of shaft of right fibula, initial encounter for closed fracture: Secondary | ICD-10-CM | POA: Diagnosis present

## 2019-06-21 DIAGNOSIS — J449 Chronic obstructive pulmonary disease, unspecified: Secondary | ICD-10-CM | POA: Diagnosis present

## 2019-06-21 DIAGNOSIS — R4182 Altered mental status, unspecified: Secondary | ICD-10-CM | POA: Diagnosis not present

## 2019-06-21 DIAGNOSIS — I10 Essential (primary) hypertension: Secondary | ICD-10-CM | POA: Diagnosis present

## 2019-06-21 DIAGNOSIS — G309 Alzheimer's disease, unspecified: Secondary | ICD-10-CM

## 2019-06-21 DIAGNOSIS — S79912A Unspecified injury of left hip, initial encounter: Secondary | ICD-10-CM | POA: Diagnosis not present

## 2019-06-21 DIAGNOSIS — S82101D Unspecified fracture of upper end of right tibia, subsequent encounter for closed fracture with routine healing: Secondary | ICD-10-CM | POA: Diagnosis not present

## 2019-06-21 DIAGNOSIS — Y92009 Unspecified place in unspecified non-institutional (private) residence as the place of occurrence of the external cause: Secondary | ICD-10-CM

## 2019-06-21 DIAGNOSIS — S82201A Unspecified fracture of shaft of right tibia, initial encounter for closed fracture: Secondary | ICD-10-CM | POA: Diagnosis not present

## 2019-06-21 DIAGNOSIS — Z1159 Encounter for screening for other viral diseases: Secondary | ICD-10-CM

## 2019-06-21 DIAGNOSIS — S82141A Displaced bicondylar fracture of right tibia, initial encounter for closed fracture: Secondary | ICD-10-CM

## 2019-06-21 DIAGNOSIS — D62 Acute posthemorrhagic anemia: Secondary | ICD-10-CM | POA: Diagnosis not present

## 2019-06-21 DIAGNOSIS — I482 Chronic atrial fibrillation, unspecified: Secondary | ICD-10-CM | POA: Diagnosis present

## 2019-06-21 DIAGNOSIS — I4821 Permanent atrial fibrillation: Secondary | ICD-10-CM | POA: Diagnosis not present

## 2019-06-21 DIAGNOSIS — Z7901 Long term (current) use of anticoagulants: Secondary | ICD-10-CM | POA: Diagnosis not present

## 2019-06-21 DIAGNOSIS — Z87891 Personal history of nicotine dependence: Secondary | ICD-10-CM

## 2019-06-21 DIAGNOSIS — S82402A Unspecified fracture of shaft of left fibula, initial encounter for closed fracture: Secondary | ICD-10-CM | POA: Diagnosis not present

## 2019-06-21 DIAGNOSIS — E119 Type 2 diabetes mellitus without complications: Secondary | ICD-10-CM

## 2019-06-21 DIAGNOSIS — I509 Heart failure, unspecified: Secondary | ICD-10-CM | POA: Diagnosis present

## 2019-06-21 DIAGNOSIS — W19XXXA Unspecified fall, initial encounter: Secondary | ICD-10-CM

## 2019-06-21 DIAGNOSIS — M25561 Pain in right knee: Secondary | ICD-10-CM | POA: Diagnosis not present

## 2019-06-21 DIAGNOSIS — S199XXA Unspecified injury of neck, initial encounter: Secondary | ICD-10-CM | POA: Diagnosis not present

## 2019-06-21 DIAGNOSIS — Z419 Encounter for procedure for purposes other than remedying health state, unspecified: Secondary | ICD-10-CM

## 2019-06-21 DIAGNOSIS — E876 Hypokalemia: Secondary | ICD-10-CM | POA: Diagnosis present

## 2019-06-21 DIAGNOSIS — S82291A Other fracture of shaft of right tibia, initial encounter for closed fracture: Secondary | ICD-10-CM | POA: Diagnosis not present

## 2019-06-21 DIAGNOSIS — S0990XA Unspecified injury of head, initial encounter: Secondary | ICD-10-CM | POA: Diagnosis not present

## 2019-06-21 DIAGNOSIS — Z79899 Other long term (current) drug therapy: Secondary | ICD-10-CM | POA: Diagnosis not present

## 2019-06-21 DIAGNOSIS — Z03818 Encounter for observation for suspected exposure to other biological agents ruled out: Secondary | ICD-10-CM | POA: Diagnosis not present

## 2019-06-21 DIAGNOSIS — I110 Hypertensive heart disease with heart failure: Secondary | ICD-10-CM | POA: Diagnosis present

## 2019-06-21 DIAGNOSIS — Z794 Long term (current) use of insulin: Secondary | ICD-10-CM

## 2019-06-21 DIAGNOSIS — Z66 Do not resuscitate: Secondary | ICD-10-CM | POA: Diagnosis present

## 2019-06-21 DIAGNOSIS — W1830XA Fall on same level, unspecified, initial encounter: Secondary | ICD-10-CM | POA: Diagnosis present

## 2019-06-21 DIAGNOSIS — S82101A Unspecified fracture of upper end of right tibia, initial encounter for closed fracture: Secondary | ICD-10-CM | POA: Diagnosis not present

## 2019-06-21 DIAGNOSIS — F0280 Dementia in other diseases classified elsewhere without behavioral disturbance: Secondary | ICD-10-CM | POA: Diagnosis not present

## 2019-06-21 DIAGNOSIS — R609 Edema, unspecified: Secondary | ICD-10-CM | POA: Diagnosis not present

## 2019-06-21 DIAGNOSIS — M7989 Other specified soft tissue disorders: Secondary | ICD-10-CM | POA: Diagnosis not present

## 2019-06-21 DIAGNOSIS — S79911A Unspecified injury of right hip, initial encounter: Secondary | ICD-10-CM | POA: Diagnosis not present

## 2019-06-21 DIAGNOSIS — T148XXA Other injury of unspecified body region, initial encounter: Secondary | ICD-10-CM

## 2019-06-21 DIAGNOSIS — I4891 Unspecified atrial fibrillation: Secondary | ICD-10-CM | POA: Diagnosis not present

## 2019-06-21 DIAGNOSIS — S82831A Other fracture of upper and lower end of right fibula, initial encounter for closed fracture: Secondary | ICD-10-CM | POA: Diagnosis not present

## 2019-06-21 DIAGNOSIS — M2550 Pain in unspecified joint: Secondary | ICD-10-CM | POA: Diagnosis not present

## 2019-06-21 DIAGNOSIS — Z7401 Bed confinement status: Secondary | ICD-10-CM | POA: Diagnosis not present

## 2019-06-21 LAB — BASIC METABOLIC PANEL
Anion gap: 11 (ref 5–15)
BUN: 25 mg/dL — ABNORMAL HIGH (ref 8–23)
CO2: 23 mmol/L (ref 22–32)
Calcium: 9.2 mg/dL (ref 8.9–10.3)
Chloride: 106 mmol/L (ref 98–111)
Creatinine, Ser: 0.99 mg/dL (ref 0.44–1.00)
GFR calc Af Amer: >60 mL/min (ref >60)
GFR calc non Af Amer: 53 mL/min — ABNORMAL LOW (ref >60)
Glucose, Bld: 193 mg/dL — ABNORMAL HIGH (ref 70–99)
Potassium: 4.1 mmol/L (ref 3.5–5.1)
Sodium: 140 mmol/L (ref 135–145)

## 2019-06-21 LAB — CBC WITH DIFFERENTIAL/PLATELET
Abs Immature Granulocytes: 0.07 10*3/uL (ref 0.00–0.07)
Basophils Absolute: 0 10*3/uL (ref 0.0–0.1)
Basophils Relative: 0 %
Eosinophils Absolute: 0.1 10*3/uL (ref 0.0–0.5)
Eosinophils Relative: 1 %
HCT: 39.6 % (ref 36.0–46.0)
Hemoglobin: 12.7 g/dL (ref 12.0–15.0)
Immature Granulocytes: 1 %
Lymphocytes Relative: 6 %
Lymphs Abs: 0.7 10*3/uL (ref 0.7–4.0)
MCH: 30.2 pg (ref 26.0–34.0)
MCHC: 32.1 g/dL (ref 30.0–36.0)
MCV: 94.3 fL (ref 80.0–100.0)
Monocytes Absolute: 0.6 10*3/uL (ref 0.1–1.0)
Monocytes Relative: 5 %
Neutro Abs: 10.8 10*3/uL — ABNORMAL HIGH (ref 1.7–7.7)
Neutrophils Relative %: 87 %
Platelets: 214 10*3/uL (ref 150–400)
RBC: 4.2 MIL/uL (ref 3.87–5.11)
RDW: 14.3 % (ref 11.5–15.5)
WBC: 12.2 10*3/uL — ABNORMAL HIGH (ref 4.0–10.5)
nRBC: 0 % (ref 0.0–0.2)

## 2019-06-21 LAB — SURGICAL PCR SCREEN
MRSA, PCR: NEGATIVE
Staphylococcus aureus: NEGATIVE

## 2019-06-21 LAB — GLUCOSE, CAPILLARY
Glucose-Capillary: 161 mg/dL — ABNORMAL HIGH (ref 70–99)
Glucose-Capillary: 128 mg/dL — ABNORMAL HIGH (ref 70–99)
Glucose-Capillary: 175 mg/dL — ABNORMAL HIGH (ref 70–99)

## 2019-06-21 LAB — TYPE AND SCREEN
ABO/RH(D): O POS
Antibody Screen: NEGATIVE

## 2019-06-21 LAB — ABO/RH: ABO/RH(D): O POS

## 2019-06-21 LAB — PROTIME-INR
INR: 1.9 — ABNORMAL HIGH (ref 0.8–1.2)
INR: 2 — ABNORMAL HIGH (ref 0.8–1.2)
Prothrombin Time: 21.2 seconds — ABNORMAL HIGH (ref 11.4–15.2)
Prothrombin Time: 22.7 seconds — ABNORMAL HIGH (ref 11.4–15.2)

## 2019-06-21 LAB — SARS CORONAVIRUS 2 BY RT PCR (HOSPITAL ORDER, PERFORMED IN ~~LOC~~ HOSPITAL LAB): SARS Coronavirus 2: NEGATIVE

## 2019-06-21 MED ORDER — GABAPENTIN 100 MG PO CAPS
100.0000 mg | ORAL_CAPSULE | Freq: Two times a day (BID) | ORAL | Status: DC
  Administered 2019-06-21 – 2019-06-22 (×4): 100 mg via ORAL
  Filled 2019-06-21 (×4): qty 1

## 2019-06-21 MED ORDER — FUROSEMIDE 40 MG PO TABS
40.0000 mg | ORAL_TABLET | Freq: Two times a day (BID) | ORAL | Status: DC
  Administered 2019-06-21 – 2019-06-26 (×9): 40 mg via ORAL
  Filled 2019-06-21 (×9): qty 1

## 2019-06-21 MED ORDER — FERROUS SULFATE 325 (65 FE) MG PO TABS
325.0000 mg | ORAL_TABLET | Freq: Two times a day (BID) | ORAL | Status: DC
  Administered 2019-06-21 – 2019-06-26 (×10): 325 mg via ORAL
  Filled 2019-06-21 (×10): qty 1

## 2019-06-21 MED ORDER — ONDANSETRON HCL 4 MG PO TABS: 4.0000 mg | ORAL_TABLET | Freq: Four times a day (QID) | ORAL | Status: DC | PRN

## 2019-06-21 MED ORDER — SERTRALINE HCL 25 MG PO TABS
25.0000 mg | ORAL_TABLET | Freq: Every day | ORAL | Status: DC
  Administered 2019-06-21 – 2019-06-26 (×5): 25 mg via ORAL
  Filled 2019-06-21 (×5): qty 1

## 2019-06-21 MED ORDER — GUAIFENESIN 100 MG/5ML PO SOLN: 200.0000 mg | Freq: Three times a day (TID) | ORAL | Status: DC | PRN

## 2019-06-21 MED ORDER — MEMANTINE HCL ER 7 MG PO CP24
14.0000 mg | ORAL_CAPSULE | Freq: Every evening | ORAL | Status: DC
  Administered 2019-06-21 – 2019-06-25 (×5): 14 mg via ORAL
  Filled 2019-06-21 (×6): qty 2

## 2019-06-21 MED ORDER — ONDANSETRON HCL 4 MG/2ML IJ SOLN: 4.0000 mg | Freq: Four times a day (QID) | INTRAMUSCULAR | Status: DC | PRN

## 2019-06-21 MED ORDER — TRAZODONE HCL 50 MG PO TABS
50.0000 mg | ORAL_TABLET | Freq: Every day | ORAL | Status: DC
  Administered 2019-06-21 – 2019-06-25 (×5): 50 mg via ORAL
  Filled 2019-06-21 (×5): qty 1

## 2019-06-21 MED ORDER — INSULIN ASPART 100 UNIT/ML ~~LOC~~ SOLN
0.0000 [IU] | Freq: Three times a day (TID) | SUBCUTANEOUS | Status: DC
  Administered 2019-06-21: 13:00:00 3 [IU] via SUBCUTANEOUS
  Administered 2019-06-21: 18:00:00 2 [IU] via SUBCUTANEOUS
  Administered 2019-06-22: 08:00:00 3 [IU] via SUBCUTANEOUS
  Administered 2019-06-22: 18:00:00 5 [IU] via SUBCUTANEOUS
  Administered 2019-06-22 – 2019-06-24 (×5): 3 [IU] via SUBCUTANEOUS
  Administered 2019-06-24: 5 [IU] via SUBCUTANEOUS
  Administered 2019-06-25 (×3): 2 [IU] via SUBCUTANEOUS
  Administered 2019-06-26: 3 [IU] via SUBCUTANEOUS
  Administered 2019-06-26: 2 [IU] via SUBCUTANEOUS

## 2019-06-21 MED ORDER — MEMANTINE HCL ER 14 MG PO CP24
14.0000 mg | ORAL_CAPSULE | Freq: Every evening | ORAL | Status: DC
  Filled 2019-06-21: qty 1

## 2019-06-21 MED ORDER — DONEPEZIL HCL 10 MG PO TABS
10.0000 mg | ORAL_TABLET | Freq: Every day | ORAL | Status: DC
  Administered 2019-06-21 – 2019-06-25 (×5): 10 mg via ORAL
  Filled 2019-06-21 (×5): qty 1

## 2019-06-21 MED ORDER — ZINC OXIDE 40 % EX OINT
TOPICAL_OINTMENT | Freq: Three times a day (TID) | CUTANEOUS | Status: DC | PRN
  Filled 2019-06-21: qty 57

## 2019-06-21 MED ORDER — HYDROCODONE-ACETAMINOPHEN 5-325 MG PO TABS
1.0000 | ORAL_TABLET | ORAL | Status: DC | PRN
  Administered 2019-06-24 – 2019-06-26 (×2): 1 via ORAL
  Filled 2019-06-21 (×2): qty 1

## 2019-06-21 MED ORDER — ACETAMINOPHEN 325 MG PO TABS
650.0000 mg | ORAL_TABLET | Freq: Four times a day (QID) | ORAL | Status: DC | PRN
  Administered 2019-06-22: 23:00:00 650 mg via ORAL
  Filled 2019-06-21: qty 2

## 2019-06-21 MED ORDER — FLUTICASONE FUROATE-VILANTEROL 100-25 MCG/INH IN AEPB
1.0000 | INHALATION_SPRAY | Freq: Every day | RESPIRATORY_TRACT | Status: DC
  Administered 2019-06-22 – 2019-06-25 (×4): 1 via RESPIRATORY_TRACT
  Filled 2019-06-21: qty 28

## 2019-06-21 MED ORDER — INSULIN GLARGINE 100 UNIT/ML ~~LOC~~ SOLN
12.0000 [IU] | Freq: Every evening | SUBCUTANEOUS | Status: DC
  Administered 2019-06-21 – 2019-06-25 (×5): 12 [IU] via SUBCUTANEOUS
  Filled 2019-06-21 (×6): qty 0.12

## 2019-06-21 MED ORDER — IPRATROPIUM-ALBUTEROL 0.5-2.5 (3) MG/3ML IN SOLN: 3.0000 mL | RESPIRATORY_TRACT | Status: DC | PRN

## 2019-06-21 MED ORDER — FENTANYL CITRATE (PF) 100 MCG/2ML IJ SOLN
50.0000 ug | INTRAMUSCULAR | Status: AC | PRN
  Administered 2019-06-21 (×2): 50 ug via INTRAVENOUS
  Filled 2019-06-21 (×2): qty 2

## 2019-06-21 MED ORDER — SODIUM CHLORIDE 0.9 % IV SOLN
INTRAVENOUS | Status: DC
  Administered 2019-06-21: 13:00:00 via INTRAVENOUS

## 2019-06-21 MED ORDER — MIRTAZAPINE 15 MG PO TABS
7.5000 mg | ORAL_TABLET | Freq: Every day | ORAL | Status: DC
  Administered 2019-06-21 – 2019-06-25 (×5): 7.5 mg via ORAL
  Filled 2019-06-21 (×5): qty 1

## 2019-06-21 MED ORDER — PANTOPRAZOLE SODIUM 40 MG PO TBEC
40.0000 mg | DELAYED_RELEASE_TABLET | Freq: Every day | ORAL | Status: DC
  Administered 2019-06-21 – 2019-06-26 (×5): 40 mg via ORAL
  Filled 2019-06-21 (×5): qty 1

## 2019-06-21 MED ORDER — ACETAMINOPHEN 650 MG RE SUPP: 650.0000 mg | Freq: Four times a day (QID) | RECTAL | Status: DC | PRN

## 2019-06-21 MED ORDER — METOPROLOL TARTRATE 25 MG PO TABS
25.0000 mg | ORAL_TABLET | Freq: Two times a day (BID) | ORAL | Status: DC
  Administered 2019-06-21 – 2019-06-26 (×11): 25 mg via ORAL
  Filled 2019-06-21 (×11): qty 1

## 2019-06-21 MED ORDER — POTASSIUM CHLORIDE CRYS ER 20 MEQ PO TBCR
20.0000 meq | EXTENDED_RELEASE_TABLET | Freq: Two times a day (BID) | ORAL | Status: DC
  Administered 2019-06-21 – 2019-06-26 (×10): 20 meq via ORAL
  Filled 2019-06-21 (×10): qty 1

## 2019-06-21 MED ORDER — INSULIN ASPART 100 UNIT/ML ~~LOC~~ SOLN
0.0000 [IU] | Freq: Every day | SUBCUTANEOUS | Status: DC
  Administered 2019-06-22: 23:00:00 2 [IU] via SUBCUTANEOUS
  Administered 2019-06-23: 3 [IU] via SUBCUTANEOUS

## 2019-06-21 MED ORDER — SENNOSIDES-DOCUSATE SODIUM 8.6-50 MG PO TABS
1.0000 | ORAL_TABLET | ORAL | Status: DC
  Administered 2019-06-21: 13:00:00 1 via ORAL
  Filled 2019-06-21: qty 1

## 2019-06-21 MED ORDER — ACETAMINOPHEN 325 MG PO TABS
650.0000 mg | ORAL_TABLET | Freq: Once | ORAL | Status: AC
  Administered 2019-06-21: 05:00:00 650 mg via ORAL
  Filled 2019-06-21: qty 2

## 2019-06-21 MED ORDER — DILTIAZEM HCL ER COATED BEADS 120 MG PO CP24
120.0000 mg | ORAL_CAPSULE | Freq: Every day | ORAL | Status: DC
  Administered 2019-06-21 – 2019-06-22 (×2): 120 mg via ORAL
  Filled 2019-06-21 (×2): qty 1

## 2019-06-21 MED ORDER — MENTHOL-ZINC OXIDE 0.44-20.6 % EX OINT: 1.0000 "application " | TOPICAL_OINTMENT | Freq: Three times a day (TID) | CUTANEOUS | Status: DC | PRN

## 2019-06-21 NOTE — ED Notes (Signed)
Bed: YJ85 Expected date:  Expected time:  Means of arrival:  Comments: 48F fall pain, on blood thinners

## 2019-06-21 NOTE — ED Notes (Signed)
EDP at bedside  

## 2019-06-21 NOTE — ED Triage Notes (Signed)
Pt BIB [facility] from [facility] where she lives in the memory care unit. Staff was assisting pt to the bathroom, when pt fell on her bottom. After using the bathroom, with staff assistance back to bed, pt then fell forward and landed on her right knee. Both falls were witnessed. Pt is on blood thinners, is c/o right knee pain and does have bruising and swelling. Pt is A&O to name and birthdate, which is baseline. Staff report pt did not hit her head or LOC.

## 2019-06-21 NOTE — H&P (Signed)
History and Physical    Grace ReichertMary Lemere Zamora:[id number]:[id number] DOB: [birth year] DOA: 06/21/2019  PCP: Eloisa NorthernAmin, Saad, MD  Patient coming from: [facility]  I have personally briefly reviewed patient's [facility] medical [facility]  Chief Complaint: Fall/right leg pain  HPI: Grace Zamora is a 84 y.o. female with medical history significant of exam is dementia, chronic atrial fibrillation on Coumadin, unknown type congestive heart failure, type 2 diabetes mellitus, hypertension was brought into the emergency department after a fall.  Patient is alert however she is oriented to her name and date of birth only and otherwise she is completely disoriented.  She could not even tell me where she lives that.  History was obtained from the ER charts and somewhat from her daughter Grace Zamora.  Reportedly, the staff at assisted living facility was getting her to the bathroom and on the way, she fell on her bottom.  When they were bringing her back to her bed, she fell again and this time she landed on her knee.  There was no head injury.  Patient started complaining of right knee so she was brought into the emergency department.  Patient had no other complaint.  ED Course: Upon arrival to the emergency department, patient was hemodynamically stable.  Basic lab work was unremarkable however CT of the right knee discovered that she had proximal tibial fibular fracture.  Orthopedic surgery was consulted and EDP spoke to Dr. Magnus IvanBlackman who wanted the patient to [facility] potential surgery tomorrow.  Hospital service was consulted to admit the patient.  Her INR is 1.9.  CT head and CT cervical spine with no acute pathology/fracture.  Review of Systems: As per HPI otherwise 10 point review of systems negative.    Past Medical History:  Diagnosis Date   Atrial fibrillation (HCC)    CHF (congestive heart failure) (HCC)    Diabetes mellitus without complication (HCC)       History reviewed. No pertinent surgical history.   reports that she has quit smoking. She has never used smokeless tobacco. She reports previous alcohol use. She reports previous drug use.  Allergies  Allergen Reactions   Lisinopril    Niacin And Related    Sulfa Antibiotics     History reviewed. No pertinent family history.  Prior to Admission medications   Medication Sig Start Date End Date Taking? Authorizing Provider  acetaminophen (TYLENOL) 500 MG tablet Take 500 mg by mouth 3 (three) times daily.   Yes [provider]  BREO ELLIPTA 100-25 MCG/INH AEPB Inhale 1 puff into the lungs daily. Rinse mouth after use 04/11/19  Yes [provider]  Chloroxylenol-Zinc Oxide (BAZA EX) Apply 1 application topically as needed. Apply after brief chance   Yes [provider]  cholecalciferol (VITAMIN D) 25 MCG (1000 UT) tablet Take 2,000 Units by mouth daily.   Yes [provider]  diltiazem (CARDIZEM CD) 120 MG 24 hr capsule Take 120 mg by mouth daily. 04/17/19  Yes [provider]  donepezil (ARICEPT) 10 MG tablet Take 10 mg by mouth daily. 04/11/19  Yes [provider]  ferrous sulfate 325 (65 FE) MG tablet Take 325 mg by mouth 2 (two) times daily with a meal.   Yes [provider]  furosemide (LASIX) 40 MG tablet Take 40 mg by mouth 2 (two) times a day. 04/07/19  Yes [provider]  gabapentin (NEURONTIN) 100 MG capsule Take 100 mg by mouth 2 (  two) times a day.  04/21/19  Yes [provider]  guaifenesin (ROBITUSSIN) 100 MG/5ML syrup Take 200 mg by mouth 3 (three) times daily as needed for cough.   Yes [provider]  ipratropium-albuterol (DUONEB) 0.5-2.5 (3) MG/3ML SOLN Take 3 mLs by nebulization every 2 (two) hours as needed (Shortness of breath).   Yes [provider]  LANTUS SOLOSTAR 100 UNIT/ML Solostar Pen Inject 22 Units into the skin every evening. 04/07/19  Yes [provider]  memantine (NAMENDA XR) 14 MG CP24 24 hr capsule Take 14 mg by mouth every evening.   Yes [provider]  Menthol-Zinc Oxide (CALMOSEPTINE) 0.44-20.6 % OINT Apply 1 application topically every 8 (eight) hours as needed (itching).   Yes [provider]  metoprolol tartrate (LOPRESSOR) 25 MG tablet Take 25 mg by mouth 2 (two) times a day. 04/17/19  Yes [provider]  mirtazapine (REMERON) 15 MG tablet Take 7.5 mg by mouth at bedtime.  03/01/19  Yes [provider]  NYSTATIN powder Apply 1 ampule topically daily. 04/12/19  Yes [provider]  omeprazole (PRILOSEC) 20 MG capsule Take 20 mg by mouth daily. 04/14/19  Yes [provider]  potassium chloride SA (K-DUR) 20 MEQ tablet Take 20 mEq by mouth 2 (two) times daily.  04/17/19  Yes [provider]  Psyllium 400 MG CAPS Take 400 mg by mouth at bedtime.   Yes [provider]  senna-docusate (SENOKOT-S) 8.6-50 MG tablet Take 1 tablet by mouth 3 (three) times a week. MWF   Yes [provider]  sertraline (ZOLOFT) 25 MG tablet Take 25 mg by mouth daily. 04/07/19  Yes [provider]  traZODone (DESYREL) 50 MG tablet Take 50 mg by mouth at bedtime. 04/17/19  Yes [provider]  trolamine salicylate (ASPERCREME) 10 % cream Apply 1 application topically 2 (two) times a day.    Yes [provider]  warfarin (COUMADIN) 2 MG tablet Take 2 mg by mouth See admin instructions. Take every Monday 04/11/19  Yes [provider]  warfarin (COUMADIN) 3 MG tablet Take 3 mg by mouth See admin instructions. Take Sunday, Tuesday, Wednesday Thursday, Friday and Saturday 04/15/19  Yes [provider]    Physical Exam: Vitals:   06/21/19 0328 06/21/19 0400 06/21/19 0430 06/21/19 0643  BP: (!) 146/60 (!) 154/94 134/85 (!) 127/98  Pulse: 97 76 62 86  Resp: 20 20 20 18   Temp: 98 F (36.7 C)     TempSrc: Oral     SpO2: 94% 94% 95% 94%     Constitutional: NAD, calm, comfortable Vitals:   06/21/19 0328 06/21/19 0400 06/21/19 0430 06/21/19 0643  BP: (!) 146/60 (!) 154/94 134/85 (!) 127/98  Pulse: 97 76 62 86  Resp: 20 20 20 18   Temp: 98 F (36.7 C)     TempSrc: Oral     SpO2: 94% 94% 95% 94%   Eyes: PERRL, lids and conjunctivae normal ENMT: Mucous membranes are dry, posterior pharynx clear of any exudate or lesions.Normal dentition.  Neck: normal, supple, no masses, no thyromegaly Respiratory: clear to auscultation bilaterally, no wheezing, no crackles. Normal respiratory effort. No accessory muscle use.  Cardiovascular: Irregularly irregular rate and rhythm, no murmurs / rubs / gallops. No extremity edema. 2+ pedal pulses. No carotid bruits.  Abdomen: no tenderness, no masses palpated. No hepatosplenomegaly. Bowel sounds positive.  Musculoskeletal: no clubbing / cyanosis. No joint deformity upper and lower extremities. Good ROM, no contractures. Normal  muscle tone.  Skin: no rashes, lesions, ulcers. No induration Neurologic: CN 2-12 grossly intact. Sensation intact, DTR normal. Strength 5/5 in all 4.  Psychiatric: Poor judgment and insight. Alert and oriented x 1. Normal mood.    Labs on Admission: I have personally reviewed following labs and imaging studies  CBC: Recent Labs  Lab 06/21/19 0601  WBC 12.2*  NEUTROABS 10.8*  HGB 12.7  HCT 39.6  MCV 94.3  PLT 214   Basic Metabolic Panel: Recent Labs  Lab 06/21/19 0601  NA 140  K 4.1  CL 106  CO2 23  GLUCOSE 193*  BUN 25*  CREATININE 0.99  CALCIUM 9.2   GFR: CrCl cannot be calculated (Unknown ideal weight.). Liver Function Tests: No results for input(s): AST, ALT, ALKPHOS, BILITOT, PROT, ALBUMIN in the last 168 hours. No results for input(s): LIPASE, AMYLASE in the last 168 hours. No results for input(s): AMMONIA in the last 168 hours. Coagulation Profile: Recent Labs  Lab 06/21/19 0601  INR 1.9*   Cardiac Enzymes: No results for  input(s): CKTOTAL, CKMB, CKMBINDEX, TROPONINI in the last 168 hours. BNP (last 3 results) No results for input(s): PROBNP in the last 8760 hours. HbA1C: No results for input(s): HGBA1C in the last 72 hours. CBG: No results for input(s): GLUCAP in the last 168 hours. Lipid Profile: No results for input(s): CHOL, HDL, LDLCALC, TRIG, CHOLHDL, LDLDIRECT in the last 72 hours. Thyroid Function Tests: No results for input(s): TSH, T4TOTAL, FREET4, T3FREE, THYROIDAB in the last 72 hours. Anemia Panel: No results for input(s): VITAMINB12, FOLATE, FERRITIN, TIBC, IRON, RETICCTPCT in the last 72 hours. Urine analysis: No results found for: COLORURINE, APPEARANCEUR, LABSPEC, PHURINE, GLUCOSEU, HGBUR, BILIRUBINUR, KETONESUR, PROTEINUR, UROBILINOGEN, NITRITE, LEUKOCYTESUR  Radiological Exams on Admission: Ct Head Wo Contrast  Result Date: 06/21/2019 CLINICAL DATA:  Fall. EXAM: CT HEAD WITHOUT CONTRAST CT CERVICAL SPINE WITHOUT CONTRAST TECHNIQUE: Multidetector CT imaging of the head and cervical spine was performed following the standard protocol without intravenous contrast. Multiplanar CT image reconstructions of the cervical spine were also generated. COMPARISON:  CT scan of Apr 25, 2019. FINDINGS: CT HEAD FINDINGS Brain: Mild diffuse cortical atrophy is noted. Mild chronic ischemic white matter disease is noted. No mass effect or midline shift is noted. Ventricular size is within normal limits. There is no evidence of mass lesion, hemorrhage or acute infarction. Vascular: No hyperdense vessel or unexpected calcification. Skull: Normal. Negative for fracture or focal lesion. Sinuses/Orbits: No acute finding. Other: None. CT CERVICAL SPINE FINDINGS Alignment: Stable grade 1 anterolisthesis of C4-5 is noted secondary to posterior facet joint hypertrophy. Pole C7 fracture is noted. No acute fracture is noted. Skull base and vertebrae: No acute fracture. No primary bone lesion or focal pathologic process. Soft  tissues and spinal canal: No prevertebral fluid or swelling. No visible canal hematoma. Disc levels:  Severe degenerative disc disease is noted at C5-6. Upper chest: Negative. Other: Degenerative changes are seen involving posterior facet joints bilaterally. IMPRESSION: Mild diffuse cortical atrophy. Mild chronic ischemic white matter disease. No acute intracranial abnormality seen. Stable degenerative changes are noted in cervical spine as described above. No acute abnormality is noted. Electronically Signed   By: Lupita RaiderJames  Green Jr M.D.   On: 06/21/2019 07:39   Ct Cervical Spine Wo Contrast  Result Date: 06/21/2019 CLINICAL DATA:  Fall. EXAM: CT HEAD WITHOUT CONTRAST CT CERVICAL SPINE WITHOUT CONTRAST TECHNIQUE: Multidetector CT imaging of the head and cervical spine was performed following the standard protocol without intravenous contrast.  Multiplanar CT image reconstructions of the cervical spine were also generated. COMPARISON:  CT scan of Apr 25, 2019. FINDINGS: CT HEAD FINDINGS Brain: Mild diffuse cortical atrophy is noted. Mild chronic ischemic white matter disease is noted. No mass effect or midline shift is noted. Ventricular size is within normal limits. There is no evidence of mass lesion, hemorrhage or acute infarction. Vascular: No hyperdense vessel or unexpected calcification. Skull: Normal. Negative for fracture or focal lesion. Sinuses/Orbits: No acute finding. Other: None. CT CERVICAL SPINE FINDINGS Alignment: Stable grade 1 anterolisthesis of C4-5 is noted secondary to posterior facet joint hypertrophy. Pole C7 fracture is noted. No acute fracture is noted. Skull base and vertebrae: No acute fracture. No primary bone lesion or focal pathologic process. Soft tissues and spinal canal: No prevertebral fluid or swelling. No visible canal hematoma. Disc levels:  Severe degenerative disc disease is noted at C5-6. Upper chest: Negative. Other: Degenerative changes are seen involving posterior facet  joints bilaterally. IMPRESSION: Mild diffuse cortical atrophy. Mild chronic ischemic white matter disease. No acute intracranial abnormality seen. Stable degenerative changes are noted in cervical spine as described above. No acute abnormality is noted. Electronically Signed   By: Marijo Conception M.D.   On: 06/21/2019 07:39   Dg Knee Complete 4 Views Right  Result Date: 06/21/2019 CLINICAL DATA:  Fall. EXAM: RIGHT KNEE - COMPLETE 4+ VIEW COMPARISON:  None. FINDINGS: Tibial metaphysis fracture with dorsal impaction. No visible extension to the joint space. There is soft tissue swelling and joint effusion. Fibular neck is likely also fractured. Prominent osteopenia. IMPRESSION: 1. Angulated tibial metaphysis fracture. 2. Fibular neck fracture. Electronically Signed   By: Monte Fantasia M.D.   On: 06/21/2019 05:28   Dg Hips Bilat W Or Wo Pelvis 3-4 Views  Result Date: 06/21/2019 CLINICAL DATA:  Fall. EXAM: DG HIP (WITH OR WITHOUT PELVIS) 3-4V BILAT COMPARISON:  None. FINDINGS: Limited under penetrated study with rotation on the pelvis view. No evidence of hip fracture or dislocation. There is a bipolar left hip hemiarthroplasty. IMPRESSION: No acute finding. Electronically Signed   By: Monte Fantasia M.D.   On: 06/21/2019 05:29    EKG: Independently reviewed.  Atrial fibrillation at the rate of 132 bpm.  Assessment/Plan Active Problems:   Tibia/fibula fracture   Fall at home, initial encounter   Atrial fibrillation, chronic   Essential hypertension   Alzheimer's dementia ([place])   Type 2 diabetes mellitus ([place])     Fall/right tibial-fibular fracture: Orthopedic consulted.  Hold Coumadin.  Potential surgery tomorrow.  N.p.o. from midnight.  Strict bedrest.  Will consult PT OT after the surgery.  Chronic atrial fibrillation: She is in atrial fibrillation however heart rates are controlled.  We will resume her beta-blocker and calcium channel blocker to control her rate and monitor on telemetry.   Hold Coumadin for potential surgery tomorrow.  Repeat INR in the morning.  Hypertension: Controlled.  Resume home medications.  Type 2 diabetes mellitus: She is on Lantus 22 units at home.  She takes at night.  Since she is going to be n.p.o. from midnight so I will give her 12 units at night along with SSI.  Check hemoglobin A1c.  Chronic congestive heart failure: Unknown type as we do not have any echo in the record.  Will resume her Lasix and potassium chloride.    DVT prophylaxis: SCD.  INR 1.9.  Holding Coumadin for potential surgery tomorrow. Code Status: DNR, confirmed from her daughter Family Communication: Called and spoke  to her daughter Shaira Sova @ [phone].  Answered several questions she had.  She wants surgeon since hospitalist to keep her updated every day and call her before the surgery. Disposition Plan: May likely need SNF upon discharge in next 3 to 4 days. Consults called: Orthopedics Admission status: Inpatient   Hughie Closs MD [facility] Pager [phone]  If 7PM-7AM, please contact night-coverage [email or web address] Password [facility]  06/21/2019, 7:58 AM

## 2019-06-21 NOTE — ED Notes (Signed)
Right leg elevated on a pillow and ice applied.

## 2019-06-21 NOTE — Progress Notes (Signed)
Patient ID: Grace Zamora, female   DOB: [birth year], 84 y.o.   MRN: [id number] Her right leg swelling has not increased from my last exam 5 hours ago.  Still though, the swelling is quite significant.  In spite of her dementia, she does follow some commands and does talk as well as respond to pain.  She only has pain on palpation of her proximal tibia.  She will move her toes for me and her right foot is well perfused. I can gently flex and extend her right ankle with minimal response to pain.  Have instructed nursing on ice and elevation.  Will need to consult Dr. Haddix/Dr. Winifred Olive Traumatologists given the complex nature of her fracture.  Too early for definitive surgery given her swelling.

## 2019-06-21 NOTE — Progress Notes (Addendum)
Orthopaedic Trauma Service (OTS) Consult   Patient ID: Grace Zamora MRN: [id number] DOB/AGE: December 16, 1935 84 y.o.   Reason for Consult: Right tibial plateau fracture  Referring Physician:  Maureen Ralphs, MD (orthopaedics)   HPI: Grace Zamora is an 84 y.o. white female who is a resident of a memory care unit here in [place].  She apparently sustained a fall in the middle of the night.  When the techs got her back to bed they realized that there was a deformity in her right leg.  She was brought to was in the hospital initially found to have a right tibial plateau fracture.  Patient subsequently transferred to [facility] for further management.  Patient was seen and evaluated by Dr. Magnus Ivan.  Due to the complexity of her injury and repeat trauma service was consulted for definitive management.  Patient seen and evaluated in 5 N. for.  She is very pleasant but demented.  Denies any pain in her right leg.  She is on Coumadin for A. Fib.  Dr. Magnus Ivan has been watching her closely regarding her soft tissue swelling.  No evidence of compartment syndrome at this time patient has not had any pain medicine really since coming up from the emergency department.  She received some fentanyl in the ED but has not received anything on the floor.   There is been no real change in her swelling over the last 6 hours or so.  Patient has good peripheral pulses denies any numbness or tingling.  Denies any pain with passive stretching.  Most recent INR available to me is 1.9  Past Medical History:  Diagnosis Date  . Atrial fibrillation (HCC)   . CHF (congestive heart failure) (HCC)   . Diabetes mellitus without complication (HCC)     History reviewed. No pertinent surgical history.  History reviewed. No pertinent family history.  Social History:  reports that she has quit smoking. She has never used smokeless tobacco. She reports previous alcohol use. She reports previous drug use.   Allergies:  Allergies  Allergen Reactions  . Lisinopril   . Niacin And Related   . Sulfa Antibiotics     Medications:  I have reviewed the patient's current medications. Prior to Admission:  Medications Prior to Admission  Medication Sig Dispense Refill Last Dose  . acetaminophen (TYLENOL) 500 MG tablet Take 500 mg by mouth 3 (three) times daily.   06/20/2019 at Unknown time  . BREO ELLIPTA 100-25 MCG/INH AEPB Inhale 1 puff into the lungs daily. Rinse mouth after use   06/20/2019 at Unknown time  . Chloroxylenol-Zinc Oxide (BAZA EX) Apply 1 application topically as needed. Apply after brief chance   06/20/2019 at Unknown time  . cholecalciferol (VITAMIN D) 25 MCG (1000 UT) tablet Take 2,000 Units by mouth daily.   06/20/2019 at Unknown time  . diltiazem (CARDIZEM CD) 120 MG 24 hr capsule Take 120 mg by mouth daily.   06/20/2019 at Unknown time  . donepezil (ARICEPT) 10 MG tablet Take 10 mg by mouth daily.   06/20/2019 at Unknown time  . ferrous sulfate 325 (65 FE) MG tablet Take 325 mg by mouth 2 (two) times daily with a meal.   06/20/2019  . furosemide (LASIX) 40 MG tablet Take 40 mg by mouth 2 (two) times a day.   06/20/2019 at Unknown time  . gabapentin (NEURONTIN) 100 MG capsule Take 100 mg by mouth 2 (two) times a day.    06/20/2019  at 2000  . guaifenesin (ROBITUSSIN) 100 MG/5ML syrup Take 200 mg by mouth 3 (three) times daily as needed for cough.   unk  . ipratropium-albuterol (DUONEB) 0.5-2.5 (3) MG/3ML SOLN Take 3 mLs by nebulization every 2 (two) hours as needed (Shortness of breath).   unk  . LANTUS SOLOSTAR 100 UNIT/ML Solostar Pen Inject 22 Units into the skin every evening.   06/20/2019 at 1900  . memantine (NAMENDA XR) 14 MG CP24 24 hr capsule Take 14 mg by mouth every evening.   06/20/2019 at Unknown time  . Menthol-Zinc Oxide (CALMOSEPTINE) 0.44-20.6 % OINT Apply 1 application topically every 8 (eight) hours as needed (itching).   unk  . metoprolol tartrate (LOPRESSOR) 25 MG tablet  Take 25 mg by mouth 2 (two) times a day.   06/20/2019 at 2000  . mirtazapine (REMERON) 15 MG tablet Take 7.5 mg by mouth at bedtime.    06/20/2019 at 2000  . NYSTATIN powder Apply 1 ampule topically daily.   06/20/2019  . omeprazole (PRILOSEC) 20 MG capsule Take 20 mg by mouth daily.   06/20/2019 at Unknown time  . potassium chloride SA (K-DUR) 20 MEQ tablet Take 20 mEq by mouth 2 (two) times daily.    06/20/2019 at Unknown time  . Psyllium 400 MG CAPS Take 400 mg by mouth at bedtime.   06/20/2019 at Unknown time  . senna-docusate (SENOKOT-S) 8.6-50 MG tablet Take 1 tablet by mouth 3 (three) times a week. MWF   06/18/2019  . sertraline (ZOLOFT) 25 MG tablet Take 25 mg by mouth daily.   06/20/2019 at Unknown time  . traZODone (DESYREL) 50 MG tablet Take 50 mg by mouth at bedtime.   06/20/2019 at Unknown time  . trolamine salicylate (ASPERCREME) 10 % cream Apply 1 application topically 2 (two) times a day.    06/20/2019 at Unknown time  . warfarin (COUMADIN) 2 MG tablet Take 2 mg by mouth See admin instructions. Take every Monday   06/14/2019 at 1700  . warfarin (COUMADIN) 3 MG tablet Take 3 mg by mouth See admin instructions. Take Sunday, Tuesday, Wednesday Thursday, Friday and Saturday   06/20/2019 at 1700    Results for orders placed or performed during the hospital encounter of 06/21/19 (from the past 48 hour(s))  Basic metabolic panel     Status: Abnormal   Collection Time: 06/21/19  6:01 AM  Result Value Ref Range   Sodium 140 135 - 145 mmol/L   Potassium 4.1 3.5 - 5.1 mmol/L   Chloride 106 98 - 111 mmol/L   CO2 23 22 - 32 mmol/L   Glucose, Bld 193 (H) 70 - 99 mg/dL   BUN 25 (H) 8 - 23 mg/dL   Creatinine, Ser 1.61 0.44 - 1.00 mg/dL   Calcium 9.2 8.9 - 09.6 mg/dL   GFR calc non Af Amer 53 (L) >60 mL/min   GFR calc Af Amer >60 >60 mL/min   Anion gap 11 5 - 15    Comment: Performed at [facility], [address], [place], [place] [address]  CBC with Differential     Status:  Abnormal   Collection Time: 06/21/19  6:01 AM  Result Value Ref Range   WBC 12.2 (H) 4.0 - 10.5 K/uL   RBC 4.20 3.87 - 5.11 MIL/uL   Hemoglobin 12.7 12.0 - 15.0 g/dL   HCT 98.1 19.1 - 47.8 %   MCV 94.3 80.0 - 100.0 fL   MCH 30.2 26.0 - 34.0 pg  MCHC 32.1 30.0 - 36.0 g/dL   RDW 40.914.3 81.111.5 - 91.415.5 %   Platelets 214 150 - 400 K/uL   nRBC 0.0 0.0 - 0.2 %   Neutrophils Relative % 87 %   Neutro Abs 10.8 (H) 1.7 - 7.7 K/uL   Lymphocytes Relative 6 %   Lymphs Abs 0.7 0.7 - 4.0 K/uL   Monocytes Relative 5 %   Monocytes Absolute 0.6 0.1 - 1.0 K/uL   Eosinophils Relative 1 %   Eosinophils Absolute 0.1 0.0 - 0.5 K/uL   Basophils Relative 0 %   Basophils Absolute 0.0 0.0 - 0.1 K/uL   Immature Granulocytes 1 %   Abs Immature Granulocytes 0.07 0.00 - 0.07 K/uL    Comment: Performed at [facility], [address] [place], [place] [address]  Protime-INR     Status: Abnormal   Collection Time: 06/21/19  6:01 AM  Result Value Ref Range   Prothrombin Time 21.2 (H) 11.4 - 15.2 seconds   INR 1.9 (H) 0.8 - 1.2    Comment: (NOTE) INR goal varies based on device and disease states. Performed at [facility], [address] [place], [place] [address]   SARS Coronavirus 2 (CEPHEID - Performed in [facility] hospital lab), Hosp Order     Status: None   Collection Time: 06/21/19  6:01 AM   Specimen: Nasopharyngeal Swab  Result Value Ref Range   SARS Coronavirus 2 NEGATIVE NEGATIVE    Comment: (NOTE) If result is NEGATIVE SARS-CoV-2 target nucleic acids are NOT DETECTED. The SARS-CoV-2 RNA is generally detectable in upper and lower  respiratory specimens during the acute phase of infection. The lowest  concentration of SARS-CoV-2 viral copies this assay can detect is 250  copies / mL. A negative result does not preclude SARS-CoV-2 infection  and should not be used as the sole basis for treatment or other  patient management decisions.  A negative result may  occur with  improper specimen collection / handling, submission of specimen other  than nasopharyngeal swab, presence of viral mutation(s) within the  areas targeted by this assay, and inadequate number of viral copies  (<250 copies / mL). A negative result must be combined with clinical  observations, patient history, and epidemiological information. If result is POSITIVE SARS-CoV-2 target nucleic acids are DETECTED. The SARS-CoV-2 RNA is generally detectable in upper and lower  respiratory specimens dur ing the acute phase of infection.  Positive  results are indicative of active infection with SARS-CoV-2.  Clinical  correlation with patient history and other diagnostic information is  necessary to determine patient infection status.  Positive results do  not rule out bacterial infection or co-infection with other viruses. If result is PRESUMPTIVE POSTIVE SARS-CoV-2 nucleic acids MAY BE PRESENT.   A presumptive positive result was obtained on the submitted specimen  and confirmed on repeat testing.  While 2019 novel coronavirus  (SARS-CoV-2) nucleic acids may be present in the submitted sample  additional confirmatory testing may be necessary for epidemiological  and / or clinical management purposes  to differentiate between  SARS-CoV-2 and other Sarbecovirus currently known to infect humans.  If clinically indicated additional testing with an alternate test  methodology [phone]([id number]) is advised. The SARS-CoV-2 RNA is generally  detectable in upper and lower respiratory sp ecimens during the acute  phase of infection. The expected result is Negative. Fact Sheet for Patients:  BoilerBrush.com.cyhttps://www.fda.gov/media/136312/download Fact Sheet for Healthcare Providers: https://pope.com/https://www.fda.gov/media/136313/download This test is not yet approved  or cleared by the [place] FDA and has been authorized for detection and/or diagnosis of SARS-CoV-2 by FDA under an Emergency Use Authorization (EUA).  This  EUA will remain in effect (meaning this test can be used) for the duration of the COVID-19 declaration under Section 564(b)(1) of the Act, 21 U.S.C. section 360bbb-3(b)(1), unless the authorization is terminated or revoked sooner. Performed at [facility], [address] [place], [place] [address]   Type and screen     Status: None   Collection Time: 06/21/19  6:01 AM  Result Value Ref Range   ABO/RH(D) O POS    Antibody Screen NEG    Sample Expiration      06/24/2019,2359 Performed at [facility], [address] [place], [place] [address]   ABO/Rh     Status: None   Collection Time: 06/21/19  6:01 AM  Result Value Ref Range   ABO/RH(D)      O POS Performed at [facility], [address] [place], [place] [address]   Surgical pcr screen     Status: None   Collection Time: 06/21/19 11:18 AM   Specimen: Nasal Mucosa; Nasal Swab  Result Value Ref Range   MRSA, PCR NEGATIVE NEGATIVE   Staphylococcus aureus NEGATIVE NEGATIVE    Comment: (NOTE) The Xpert SA Assay (FDA approved for NASAL specimens in patients 82 years of age and older), is one component of a comprehensive surveillance program. It is not intended to diagnose infection nor to guide or monitor treatment. Performed at [facility], [address] [place], [place] [address]   Glucose, capillary     Status: Abnormal   Collection Time: 06/21/19 12:07 PM  Result Value Ref Range   Glucose-Capillary 161 (H) 70 - 99 mg/dL  Protime-INR     Status: Abnormal   Collection Time: 06/21/19  2:02 PM  Result Value Ref Range   Prothrombin Time 22.7 (H) 11.4 - 15.2 seconds   INR 2.0 (H) 0.8 - 1.2    Comment: (NOTE) INR goal varies based on device and disease states. Performed at [facility], [address] [place], [place] [address]   Glucose, capillary     Status: Abnormal   Collection Time: 06/21/19  4:55 PM  Result Value Ref Range    Glucose-Capillary 128 (H) 70 - 99 mg/dL    Ct Head Wo Contrast  Result Date: 06/21/2019 CLINICAL DATA:  Fall. EXAM: CT HEAD WITHOUT CONTRAST CT CERVICAL SPINE WITHOUT CONTRAST TECHNIQUE: Multidetector CT imaging of the head and cervical spine was performed following the standard protocol without intravenous contrast. Multiplanar CT image reconstructions of the cervical spine were also generated. COMPARISON:  CT scan of Apr 25, 2019. FINDINGS: CT HEAD FINDINGS Brain: Mild diffuse cortical atrophy is noted. Mild chronic ischemic white matter disease is noted. No mass effect or midline shift is noted. Ventricular size is within normal limits. There is no evidence of mass lesion, hemorrhage or acute infarction. Vascular: No hyperdense vessel or unexpected calcification. Skull: Normal. Negative for fracture or focal lesion. Sinuses/Orbits: No acute finding. Other: None. CT CERVICAL SPINE FINDINGS Alignment: Stable grade 1 anterolisthesis of C4-5 is noted secondary to posterior facet joint hypertrophy. Pole C7 fracture is noted. No acute fracture is noted. Skull base and vertebrae: No acute fracture. No primary bone lesion or focal pathologic process. Soft tissues and spinal canal: No prevertebral fluid or swelling. No visible canal hematoma. Disc levels:  Severe degenerative  disc disease is noted at C5-6. Upper chest: Negative. Other: Degenerative changes are seen involving posterior facet joints bilaterally. IMPRESSION: Mild diffuse cortical atrophy. Mild chronic ischemic white matter disease. No acute intracranial abnormality seen. Stable degenerative changes are noted in cervical spine as described above. No acute abnormality is noted. Electronically Signed   By: Marijo Conception M.D.   On: 06/21/2019 07:39   Ct Cervical Spine Wo Contrast  Result Date: 06/21/2019 CLINICAL DATA:  Fall. EXAM: CT HEAD WITHOUT CONTRAST CT CERVICAL SPINE WITHOUT CONTRAST TECHNIQUE: Multidetector CT imaging of the head and cervical  spine was performed following the standard protocol without intravenous contrast. Multiplanar CT image reconstructions of the cervical spine were also generated. COMPARISON:  CT scan of Apr 25, 2019. FINDINGS: CT HEAD FINDINGS Brain: Mild diffuse cortical atrophy is noted. Mild chronic ischemic white matter disease is noted. No mass effect or midline shift is noted. Ventricular size is within normal limits. There is no evidence of mass lesion, hemorrhage or acute infarction. Vascular: No hyperdense vessel or unexpected calcification. Skull: Normal. Negative for fracture or focal lesion. Sinuses/Orbits: No acute finding. Other: None. CT CERVICAL SPINE FINDINGS Alignment: Stable grade 1 anterolisthesis of C4-5 is noted secondary to posterior facet joint hypertrophy. Pole C7 fracture is noted. No acute fracture is noted. Skull base and vertebrae: No acute fracture. No primary bone lesion or focal pathologic process. Soft tissues and spinal canal: No prevertebral fluid or swelling. No visible canal hematoma. Disc levels:  Severe degenerative disc disease is noted at C5-6. Upper chest: Negative. Other: Degenerative changes are seen involving posterior facet joints bilaterally. IMPRESSION: Mild diffuse cortical atrophy. Mild chronic ischemic white matter disease. No acute intracranial abnormality seen. Stable degenerative changes are noted in cervical spine as described above. No acute abnormality is noted. Electronically Signed   By: Marijo Conception M.D.   On: 06/21/2019 07:39   Ct Knee Right Wo Contrast  Result Date: 06/21/2019 CLINICAL DATA:  Evaluate complex tibia fracture. EXAM: CT OF THE right KNEE WITHOUT CONTRAST TECHNIQUE: Multidetector CT imaging of the technique right knee was performed according to the standard protocol. Multiplanar CT image reconstructions were also generated. COMPARISON:  Radiographs same date. FINDINGS: There is an inverted T-shaped proximal tibia fracture with a long longitudinal  fracture extending between the tibial spines down to the transverse component of the fracture at the metadiaphyseal junction. There is 1 cm of lateral displacement and 9 mm of posterior displacement of the transverse fracture. Associated comminuted fracture of the fibular head and neck. No fracture of the femur or patella. There is a large joint effusion/lipohemarthrosis. There is also extensive subcutaneous soft tissue swelling/edema/hemorrhage in the anterior soft tissues. Grossly the cruciate and collateral ligaments are intact and the quadriceps and patellar tendons are intact. Moderate to advanced vascular calcifications noted. IMPRESSION: 1. Inverted T shaped fracture of the proximal tibia as detailed above. 2. Comminuted fracture of the fibular head and neck. 3. Large lipohemarthrosis. 4. Grossly intact knee ligaments. Electronically Signed   By: Marijo Sanes M.D.   On: 06/21/2019 08:00   Dg Chest Port 1 View  Result Date: 06/21/2019 CLINICAL DATA:  Preoperative tibia fracture repair EXAM: PORTABLE CHEST 1 VIEW COMPARISON:  04/25/2019 chest radiograph. FINDINGS: Stable cardiomediastinal silhouette with normal heart size. No pneumothorax. No pleural effusion. Lungs appear clear, with no acute consolidative airspace disease and no pulmonary edema. IMPRESSION: No active disease. Electronically Signed   By: Ilona Sorrel M.D.   On: 06/21/2019 08:19  Dg Knee Complete 4 Views Right  Result Date: 06/21/2019 CLINICAL DATA:  Fall. EXAM: RIGHT KNEE - COMPLETE 4+ VIEW COMPARISON:  None. FINDINGS: Tibial metaphysis fracture with dorsal impaction. No visible extension to the joint space. There is soft tissue swelling and joint effusion. Fibular neck is likely also fractured. Prominent osteopenia. IMPRESSION: 1. Angulated tibial metaphysis fracture. 2. Fibular neck fracture. Electronically Signed   By: Marnee SpringJonathon  Watts M.D.   On: 06/21/2019 05:28   Dg Hips Bilat W Or Wo Pelvis 3-4 Views  Result Date: 06/21/2019  CLINICAL DATA:  Fall. EXAM: DG HIP (WITH OR WITHOUT PELVIS) 3-4V BILAT COMPARISON:  None. FINDINGS: Limited under penetrated study with rotation on the pelvis view. No evidence of hip fracture or dislocation. There is a bipolar left hip hemiarthroplasty. IMPRESSION: No acute finding. Electronically Signed   By: Marnee SpringJonathon  Watts M.D.   On: 06/21/2019 05:29    Review of Systems  Constitutional: Negative for chills and fever.  Respiratory: Negative for shortness of breath and wheezing.   Cardiovascular: Negative for chest pain and palpitations.  Musculoskeletal:       Right knee pain  Neurological: Negative for tingling and sensory change.   Blood pressure 128/88, pulse (!) 126, temperature 98.5 F (36.9 C), temperature source Oral, resp. rate 18, SpO2 100 %. Physical Exam Vitals signs and nursing note reviewed.  Constitutional:      General: She is awake.     Appearance: She is well-developed and well-groomed.     Comments: Pleasant  Cardiovascular:     Comments: s1 and s2  Pulmonary:     Effort: No accessory muscle usage or respiratory distress.  Abdominal:     Comments: Soft, NTND, + BS   Musculoskeletal:     Comments: Pelvis   no traumatic wounds or rash, no ecchymosis, stable to manual stress, nontender  Right Lower Extremity             Knee immobilizer in place             Ice packs in place             Knee is flexed to about 30 to 35 degrees             Moderate ecchymosis present             Moderate edema present             Skin does not wrinkle with gentle compression of the lateral or medial proximal tibia             Fracture blister starting to form proximal lower leg             Compartments are firm             Extremity is warm, palpable dorsalis pedis pulse.  This is symmetric to the contralateral side             Brisk capillary refill (performed on 3rd toe)             No pain out of proportion with passive stretching of toes or ankle.  Patient is actively  moving her toes and ankle without increasing pain.             Only has pain with direct palpation of her proximal tibia             Ankle and foot are nontender  Left Lower Extremity  no open wounds or lesions, no swelling or ecchymosis   Nontender hip, knee, ankle and foot             No crepitus or gross motion noted with manipulation of the L leg  No knee or ankle effusion             No pain with axial loading or logrolling of the hip. Negative Stinchfield test   Knee stable to varus/ valgus and anterior/posterior stress             No pain with manipulation of the ankle or foot             No blocks to motion noted  Sens DPN, SPN, TN intact  Motor EHL, FHL, lesser toe motor, Ext, flex, evers 5/5  DP 2+, No significant edema             Compartments are soft and nontender, no pain with passive stretching  Bilateral upper Extremities UEx shoulder, elbow, wrist, digits- no skin wounds, nontender, no instability, no blocks to motion  Sens  Ax/R/M/U intact  Mot   Ax/ R/ PIN/ M/ AIN/ U intact  Rad 2+      Skin:    General: Skin is warm.  Psychiatric:     Comments: + dementia        Assessment/Plan:  84 year old female ground-level fall with comminuted right tibial plateau fracture, chronic anticoagulation with Coumadin    -Comminuted right tibial plateau fracture, comminuted right fibular fracture             Patient's fracture pattern is unstable and will require surgical stabilization.             At this time I think she has too much soft tissue swelling present even for limited ORIF.     Ex fix vs long leg splint for soft tissue rest   Definitive fixation end of this week vs early next week               Discussed this with patient's daughter.  She is in agreement with the plan.    Serial soft tissue evaluations          Follow INR    Aggressive ice and elevation of R leg    NWB R leg                              -medical issues              Per primary    - Chronic anticoagulation                 Hold coumadin    - dispo              inpatient management   Ex fix vs Long leg cast for soft tissue rest   Ice and elevate       Mearl Latin, PA-C [phone] (C) 06/21/2019, 6:37 PM  [facility] [address] [place] [address] [phone] Collier Bullock (F)

## 2019-06-21 NOTE — ED Notes (Signed)
Pt transported to CT ?

## 2019-06-21 NOTE — Progress Notes (Signed)
Orthopedic Tech Progress Note Patient Details:  Grace Zamora 01/25/1935 [id number] Applied jones dressing right leg. Patient ID: Grace Zamora, female   DOB: 1935-10-10, 84 y.o.   MRN: [id number]   Martie Lee 06/21/2019, 6:19 PM

## 2019-06-21 NOTE — Consult Note (Signed)
Reason for Consult: Right complex proximal tibia fracture Referring Physician: Rhunette CroftNanavati, MD  Grace ReichertMary Zamora is an 84 y.o. female.  HPI: The patient is a pleasantly demented 84 year old female who resides in a memory care unit secondary to dementia.  She is an Manufacturing systems engineerambulator as well.  She apparently sustained some type of mechanical fall late last evening.  She was [facility] to the [facility] emergency room and found to have a complex proximal tibia fracture.  She is on Coumadin as a blood thinner.  There is no family at the bedside secondary to the coronavirus pandemic.  She is awake and alert and follows commands but not oriented.  She does report pain of the right leg on my exam.  She denies numbness and tingling in her feet and follows some limited commands.  Past Medical History:  Diagnosis Date  . Atrial fibrillation (HCC)   . CHF (congestive heart failure) (HCC)   . Diabetes mellitus without complication (HCC)     History reviewed. No pertinent surgical history.  History reviewed. No pertinent family history.  Social History:  reports that she has quit smoking. She has never used smokeless tobacco. She reports previous alcohol use. She reports previous drug use.  Allergies:  Allergies  Allergen Reactions  . Lisinopril   . Niacin And Related   . Sulfa Antibiotics     Medications: I have reviewed the patient's current medications.  Results for orders placed or performed during the hospital encounter of 06/21/19 (from the past 48 hour(s))  Basic metabolic panel     Status: Abnormal   Collection Time: 06/21/19  6:01 AM  Result Value Ref Range   Sodium 140 135 - 145 mmol/L   Potassium 4.1 3.5 - 5.1 mmol/L   Chloride 106 98 - 111 mmol/L   CO2 23 22 - 32 mmol/L   Glucose, Bld 193 (H) 70 - 99 mg/dL   BUN 25 (H) 8 - 23 mg/dL   Creatinine, Ser 1.610.99 0.44 - 1.00 mg/dL   Calcium 9.2 8.9 - 09.610.3 mg/dL   GFR calc non Af Amer 53 (L) >60 mL/min   GFR calc Af Amer >60 >60 mL/min   Anion gap  11 5 - 15    Comment: Performed at [facility], [address] [place], [place] [address]  CBC with Differential     Status: Abnormal   Collection Time: 06/21/19  6:01 AM  Result Value Ref Range   WBC 12.2 (H) 4.0 - 10.5 K/uL   RBC 4.20 3.87 - 5.11 MIL/uL   Hemoglobin 12.7 12.0 - 15.0 g/dL   HCT 98.139.6 19.136.0 - 47.846.0 %   MCV 94.3 80.0 - 100.0 fL   MCH 30.2 26.0 - 34.0 pg   MCHC 32.1 30.0 - 36.0 g/dL   RDW 29.514.3 62.111.5 - 30.815.5 %   Platelets 214 150 - 400 K/uL   nRBC 0.0 0.0 - 0.2 %   Neutrophils Relative % 87 %   Neutro Abs 10.8 (H) 1.7 - 7.7 K/uL   Lymphocytes Relative 6 %   Lymphs Abs 0.7 0.7 - 4.0 K/uL   Monocytes Relative 5 %   Monocytes Absolute 0.6 0.1 - 1.0 K/uL   Eosinophils Relative 1 %   Eosinophils Absolute 0.1 0.0 - 0.5 K/uL   Basophils Relative 0 %   Basophils Absolute 0.0 0.0 - 0.1 K/uL   Immature Granulocytes 1 %   Abs Immature Granulocytes 0.07 0.00 - 0.07 K/uL    Comment: Performed at  [facility], [address] [place], [place] [address]  Protime-INR     Status: Abnormal   Collection Time: 06/21/19  6:01 AM  Result Value Ref Range   Prothrombin Time 21.2 (H) 11.4 - 15.2 seconds   INR 1.9 (H) 0.8 - 1.2    Comment: (NOTE) INR goal varies based on device and disease states. Performed at [facility], [address] [place], [place] [address]   SARS Coronavirus 2 (CEPHEID - Performed in [facility] hospital lab), Hosp Order     Status: None   Collection Time: 06/21/19  6:01 AM   Specimen: Nasopharyngeal Swab  Result Value Ref Range   SARS Coronavirus 2 NEGATIVE NEGATIVE    Comment: (NOTE) If result is NEGATIVE SARS-CoV-2 target nucleic acids are NOT DETECTED. The SARS-CoV-2 RNA is generally detectable in upper and lower  respiratory specimens during the acute phase of infection. The lowest  concentration of SARS-CoV-2 viral copies this assay can detect is 250  copies / mL. A negative result does not  preclude SARS-CoV-2 infection  and should not be used as the sole basis for treatment or other  patient management decisions.  A negative result may occur with  improper specimen collection / handling, submission of specimen other  than nasopharyngeal swab, presence of viral mutation(s) within the  areas targeted by this assay, and inadequate number of viral copies  (<250 copies / mL). A negative result must be combined with clinical  observations, patient history, and epidemiological information. If result is POSITIVE SARS-CoV-2 target nucleic acids are DETECTED. The SARS-CoV-2 RNA is generally detectable in upper and lower  respiratory specimens dur ing the acute phase of infection.  Positive  results are indicative of active infection with SARS-CoV-2.  Clinical  correlation with patient history and other diagnostic information is  necessary to determine patient infection status.  Positive results do  not rule out bacterial infection or co-infection with other viruses. If result is PRESUMPTIVE POSTIVE SARS-CoV-2 nucleic acids MAY BE PRESENT.   A presumptive positive result was obtained on the submitted specimen  and confirmed on repeat testing.  While 2019 novel coronavirus  (SARS-CoV-2) nucleic acids may be present in the submitted sample  additional confirmatory testing may be necessary for epidemiological  and / or clinical management purposes  to differentiate between  SARS-CoV-2 and other Sarbecovirus currently known to infect humans.  If clinically indicated additional testing with an alternate test  methodology [phone]([id number]) is advised. The SARS-CoV-2 RNA is generally  detectable in upper and lower respiratory sp ecimens during the acute  phase of infection. The expected result is Negative. Fact Sheet for Patients:  BoilerBrush.com.cyhttps://www.fda.gov/media/136312/download Fact Sheet for Healthcare Providers: https://pope.com/https://www.fda.gov/media/136313/download This test is not yet approved or cleared by  the [place] FDA and has been authorized for detection and/or diagnosis of SARS-CoV-2 by FDA under an Emergency Use Authorization (EUA).  This EUA will remain in effect (meaning this test can be used) for the duration of the COVID-19 declaration under Section 564(b)(1) of the Act, 21 U.S.C. section 360bbb-3(b)(1), unless the authorization is terminated or revoked sooner. Performed at [facility], [address] [place], [place] [address]   Type and screen     Status: None   Collection Time: 06/21/19  6:01 AM  Result Value Ref Range   ABO/RH(D) O POS    Antibody Screen NEG    Sample Expiration      06/24/2019,2359 Performed at [facility],  [place] [address] [place], [place] [address]   ABO/Rh     Status: None (Preliminary result)   Collection Time: 06/21/19  6:01 AM  Result Value Ref Range   ABO/RH(D)      O POS Performed at [facility], [place] [address] [place], [place] [address]     Ct Head Wo Contrast  Result Date: 06/21/2019 CLINICAL DATA:  Fall. EXAM: CT HEAD WITHOUT CONTRAST CT CERVICAL SPINE WITHOUT CONTRAST TECHNIQUE: Multidetector CT imaging of the head and cervical spine was performed following the standard protocol without intravenous contrast. Multiplanar CT image reconstructions of the cervical spine were also generated. COMPARISON:  CT scan of Apr 25, 2019. FINDINGS: CT HEAD FINDINGS Brain: Mild diffuse cortical atrophy is noted. Mild chronic ischemic white matter disease is noted. No mass effect or midline shift is noted. Ventricular size is within normal limits. There is no evidence of mass lesion, hemorrhage or acute infarction. Vascular: No hyperdense vessel or unexpected calcification. Skull: Normal. Negative for fracture or focal lesion. Sinuses/Orbits: No acute finding. Other: None. CT CERVICAL SPINE FINDINGS Alignment: Stable grade 1 anterolisthesis of C4-5 is noted secondary to posterior facet joint  hypertrophy. Pole C7 fracture is noted. No acute fracture is noted. Skull base and vertebrae: No acute fracture. No primary bone lesion or focal pathologic process. Soft tissues and spinal canal: No prevertebral fluid or swelling. No visible canal hematoma. Disc levels:  Severe degenerative disc disease is noted at C5-6. Upper chest: Negative. Other: Degenerative changes are seen involving posterior facet joints bilaterally. IMPRESSION: Mild diffuse cortical atrophy. Mild chronic ischemic white matter disease. No acute intracranial abnormality seen. Stable degenerative changes are noted in cervical spine as described above. No acute abnormality is noted. Electronically Signed   By: Marijo Conception M.D.   On: 06/21/2019 07:39   Ct Cervical Spine Wo Contrast  Result Date: 06/21/2019 CLINICAL DATA:  Fall. EXAM: CT HEAD WITHOUT CONTRAST CT CERVICAL SPINE WITHOUT CONTRAST TECHNIQUE: Multidetector CT imaging of the head and cervical spine was performed following the standard protocol without intravenous contrast. Multiplanar CT image reconstructions of the cervical spine were also generated. COMPARISON:  CT scan of Apr 25, 2019. FINDINGS: CT HEAD FINDINGS Brain: Mild diffuse cortical atrophy is noted. Mild chronic ischemic white matter disease is noted. No mass effect or midline shift is noted. Ventricular size is within normal limits. There is no evidence of mass lesion, hemorrhage or acute infarction. Vascular: No hyperdense vessel or unexpected calcification. Skull: Normal. Negative for fracture or focal lesion. Sinuses/Orbits: No acute finding. Other: None. CT CERVICAL SPINE FINDINGS Alignment: Stable grade 1 anterolisthesis of C4-5 is noted secondary to posterior facet joint hypertrophy. Pole C7 fracture is noted. No acute fracture is noted. Skull base and vertebrae: No acute fracture. No primary bone lesion or focal pathologic process. Soft tissues and spinal canal: No prevertebral fluid or swelling. No visible  canal hematoma. Disc levels:  Severe degenerative disc disease is noted at C5-6. Upper chest: Negative. Other: Degenerative changes are seen involving posterior facet joints bilaterally. IMPRESSION: Mild diffuse cortical atrophy. Mild chronic ischemic white matter disease. No acute intracranial abnormality seen. Stable degenerative changes are noted in cervical spine as described above. No acute abnormality is noted. Electronically Signed   By: Marijo Conception M.D.   On: 06/21/2019 07:39   Dg Knee Complete 4 Views Right  Result Date: 06/21/2019 CLINICAL DATA:  Fall. EXAM: RIGHT KNEE - COMPLETE 4+ VIEW COMPARISON:  None. FINDINGS: Tibial metaphysis  fracture with dorsal impaction. No visible extension to the joint space. There is soft tissue swelling and joint effusion. Fibular neck is likely also fractured. Prominent osteopenia. IMPRESSION: 1. Angulated tibial metaphysis fracture. 2. Fibular neck fracture. Electronically Signed   By: Marnee SpringJonathon  Watts M.D.   On: 06/21/2019 05:28   Dg Hips Bilat W Or Wo Pelvis 3-4 Views  Result Date: 06/21/2019 CLINICAL DATA:  Fall. EXAM: DG HIP (WITH OR WITHOUT PELVIS) 3-4V BILAT COMPARISON:  None. FINDINGS: Limited under penetrated study with rotation on the pelvis view. No evidence of hip fracture or dislocation. There is a bipolar left hip hemiarthroplasty. IMPRESSION: No acute finding. Electronically Signed   By: Marnee SpringJonathon  Watts M.D.   On: 06/21/2019 05:29    ROS Blood pressure (!) 127/98, pulse 86, temperature 98 F (36.7 C), temperature source Oral, resp. rate 18, SpO2 94 %. Physical Exam  Constitutional: She appears well-developed and well-nourished.  HENT:  Head: Normocephalic and atraumatic.  Eyes: Pupils are equal, round, and reactive to light.  Neck: Normal range of motion. Neck supple.  Cardiovascular: Normal rate.  Respiratory: Effort normal.  GI: Soft.  Musculoskeletal:     Right knee: She exhibits decreased range of motion, swelling and effusion.  Tenderness found.     Right lower leg: She exhibits tenderness, bony tenderness, swelling and deformity. Edema present.  Neurological: She is alert.  Skin: Skin is warm and dry.  Psychiatric: She has a normal mood and affect.    The skin is intact with the proximal tibia.  There is significant swelling globally around the knee and proximal tibia with bruising as well.  The compartments are soft but full.  Both feet are well-perfused.  She does not have pain on passive stretch when I flex and extend her ankles.  She does have pain with palpation around the proximal tibia where the fracture is.  Both feet have equal pulses that are faint.  Assessment/Plan: Right complex proximal tibia fracture involving the metaphysis and potentially the plateau with extensive soft tissue swelling  This is a complex fracture and with extensive swelling we will not have surgery today.  She is being graciously admitted to the medicine service.  She will have her leg elevated and ice placed and I will check on her again in a few hours to make sure she is not developing a compartment syndrome although so far the proximal tibia soft tissue is not firm.  This is an extensive and complex fracture that will require surgical intervention.  She has osteopenic bone as well.  I will likely consult the [facility] (Dr. Handy/Dr. Jena GaussHaddix) for their expertise in treating fractures such as this.   Grace HitchChristopher Y Theodore Rahrig 06/21/2019, 7:59 AM

## 2019-06-21 NOTE — ED Provider Notes (Addendum)
[facility]-EMERGENCY DEPT Provider Note   CSN: [id number] Arrival date & time: 06/21/19  [id number]    History   Chief Complaint Chief Complaint  Patient presents with  . Fall  . Leg Injury    HPI Grace Zamora is a 84 y.o. female.     HPI 84 year old with history of CHF, diabetes, A. Fib on anticoagulation, dementia comes in with chief complaint of fall.  According to the nursing home, patient was being helped to the bathroom when she fell backwards onto her buttock.  When she was being helped to her bed after she urinated she fell forward onto her knee.   Patient is complaining of knee pain.  She denies any severe headaches. Past Medical History:  Diagnosis Date  . Atrial fibrillation (HCC)   . CHF (congestive heart failure) (HCC)   . Diabetes mellitus without complication (HCC)     There are no active problems to display for this patient.      OB History   No obstetric history on file.      Home Medications    Prior to Admission medications   Medication Sig Start Date End Date Taking? Authorizing Provider  acetaminophen (TYLENOL) 500 MG tablet Take 500 mg by mouth 3 (three) times daily.   Yes [provider]  BREO ELLIPTA 100-25 MCG/INH AEPB Inhale 1 puff into the lungs daily. Rinse mouth after use 04/11/19  Yes [provider]  Chloroxylenol-Zinc Oxide (BAZA EX) Apply 1 application topically as needed. Apply after brief chance   Yes [provider]  cholecalciferol (VITAMIN D) 25 MCG (1000 UT) tablet Take 2,000 Units by mouth daily.   Yes [provider]  diltiazem (CARDIZEM CD) 120 MG 24 hr capsule Take 120 mg by mouth daily. 04/17/19  Yes [provider]  donepezil (ARICEPT) 10 MG tablet Take 10 mg by mouth daily. 04/11/19  Yes [provider]  ferrous sulfate 325 (65 FE) MG tablet Take 325 mg by mouth 2 (two) times daily with a meal.   Yes [provider]  furosemide (LASIX) 40 MG  tablet Take 40 mg by mouth 2 (two) times a day. 04/07/19  Yes [provider]  gabapentin (NEURONTIN) 100 MG capsule Take 100 mg by mouth 2 (two) times a day.  04/21/19  Yes [provider]  guaifenesin (ROBITUSSIN) 100 MG/5ML syrup Take 200 mg by mouth 3 (three) times daily as needed for cough.   Yes [provider]  ipratropium-albuterol (DUONEB) 0.5-2.5 (3) MG/3ML SOLN Take 3 mLs by nebulization every 2 (two) hours as needed (Shortness of breath).   Yes [provider]  LANTUS SOLOSTAR 100 UNIT/ML Solostar Pen Inject 22 Units into the skin every evening. 04/07/19  Yes [provider]  memantine (NAMENDA XR) 14 MG CP24 24 hr capsule Take 14 mg by mouth every evening.   Yes [provider]  Menthol-Zinc Oxide (CALMOSEPTINE) 0.44-20.6 % OINT Apply 1 application topically every 8 (eight) hours as needed (itching).   Yes [provider]  metoprolol tartrate (LOPRESSOR) 25 MG tablet Take 25 mg by mouth 2 (two) times a day. 04/17/19  Yes [provider]  mirtazapine (REMERON) 15 MG tablet Take 7.5 mg by mouth at bedtime.  03/01/19  Yes [provider]  NYSTATIN powder Apply 1 ampule topically daily. 04/12/19  Yes [provider]  omeprazole (PRILOSEC) 20 MG capsule Take 20 mg by mouth daily. 04/14/19  Yes [provider]  potassium chloride  SA (K-DUR) 20 MEQ tablet Take 20 mEq by mouth 2 (two) times daily.  04/17/19  Yes [provider]  Psyllium 400 MG CAPS Take 400 mg by mouth at bedtime.   Yes [provider]  senna-docusate (SENOKOT-S) 8.6-50 MG tablet Take 1 tablet by mouth 3 (three) times a week. MWF   Yes [provider]  sertraline (ZOLOFT) 25 MG tablet Take 25 mg by mouth daily. 04/07/19  Yes [provider]  traZODone (DESYREL) 50 MG tablet Take 50 mg by mouth at bedtime. 04/17/19  Yes [provider]  trolamine salicylate (ASPERCREME) 10 % cream Apply 1  application topically 2 (two) times a day.    Yes [provider]  warfarin (COUMADIN) 2 MG tablet Take 2 mg by mouth See admin instructions. Take every Monday 04/11/19  Yes [provider]  warfarin (COUMADIN) 3 MG tablet Take 3 mg by mouth See admin instructions. Take Sunday, Tuesday, Wednesday Thursday, Friday and Saturday 04/15/19  Yes [provider]    Family History No family history on file.  Social History Social History   Tobacco Use  . Smoking status: Not on file  Substance Use Topics  . Alcohol use: Not on file  . Drug use: Not on file     Allergies   Lisinopril, Niacin and related, and Sulfa antibiotics   Review of Systems Review of Systems  Unable to perform ROS: Dementia  Constitutional: Positive for activity change.  Musculoskeletal: Positive for arthralgias.  Neurological: Negative for headaches.  Hematological: Bruises/bleeds easily.     Physical Exam Updated Vital Signs BP (!) 127/98   Pulse 86   Temp 98 F (36.7 C) (Oral)   Resp 18   SpO2 94%   Physical Exam Vitals signs and nursing note reviewed.  Constitutional:      Appearance: She is well-developed.  HENT:     Head: Normocephalic and atraumatic.  Neck:     Musculoskeletal: Normal range of motion and neck supple.  Cardiovascular:     Rate and Rhythm: Normal rate.  Pulmonary:     Effort: Pulmonary effort is normal.  Abdominal:     General: Bowel sounds are normal.  Musculoskeletal:        General: Swelling, tenderness and deformity present.     Left lower leg: Edema present.  Skin:    General: Skin is warm and dry.  Neurological:     Mental Status: She is alert.      ED Treatments / Results  Labs (all labs ordered are listed, but only abnormal results are displayed) Labs Reviewed  BASIC METABOLIC PANEL - Abnormal; Notable for the following components:      Result Value   Glucose, Bld 193 (*)    BUN 25 (*)    GFR calc non Af Amer 53 (*)    All  other components within normal limits  CBC WITH DIFFERENTIAL/PLATELET - Abnormal; Notable for the following components:   WBC 12.2 (*)    Neutro Abs 10.8 (*)    All other components within normal limits  PROTIME-INR - Abnormal; Notable for the following components:   Prothrombin Time 21.2 (*)    INR 1.9 (*)    All other components within normal limits  SARS CORONAVIRUS 2 (HOSPITAL ORDER, PERFORMED IN [facility])  TYPE AND SCREEN    EKG EKG Interpretation  Date/Time:  Monday June 21 2019 06:28:32 EDT Ventricular Rate:  132 PR Interval:    QRS Duration:  80 QT Interval:  345 QTC Calculation: 512 R Axis:   16 Text Interpretation:  Atrial fibrillation Ventricular premature complex ST depression, probably rate related Prolonged QT interval No acute changes No significant change since last tracing Confirmed by Varney Biles [phone]) on 06/21/2019 6:55:21 AM   Radiology Dg Knee Complete 4 Views Right  Result Date: 06/21/2019 CLINICAL DATA:  Fall. EXAM: RIGHT KNEE - COMPLETE 4+ VIEW COMPARISON:  None. FINDINGS: Tibial metaphysis fracture with dorsal impaction. No visible extension to the joint space. There is soft tissue swelling and joint effusion. Fibular neck is likely also fractured. Prominent osteopenia. IMPRESSION: 1. Angulated tibial metaphysis fracture. 2. Fibular neck fracture. Electronically Signed   By: Monte Fantasia M.D.   On: 06/21/2019 05:28   Dg Hips Bilat W Or Wo Pelvis 3-4 Views  Result Date: 06/21/2019 CLINICAL DATA:  Fall. EXAM: DG HIP (WITH OR WITHOUT PELVIS) 3-4V BILAT COMPARISON:  None. FINDINGS: Limited under penetrated study with rotation on the pelvis view. No evidence of hip fracture or dislocation. There is a bipolar left hip hemiarthroplasty. IMPRESSION: No acute finding. Electronically Signed   By: Monte Fantasia M.D.   On: 06/21/2019 05:29    Procedures Procedures (including critical care time)  Medications Ordered in ED Medications   fentaNYL (SUBLIMAZE) injection 50 mcg (50 mcg Intravenous Given 06/21/19 0613)  acetaminophen (TYLENOL) tablet 650 mg (650 mg Oral Given 06/21/19 0438)     Initial Impression / Assessment and Plan / ED Course  I have reviewed the triage vital signs and the nursing notes.  Pertinent labs & imaging results that were available during my care of the patient were reviewed by me and considered in my medical decision making (see chart for details).        84 year old female comes in a chief complaint of fall. She is on anticoagulation because of her A. Fib.  The fall was mechanical and witnessed.  She is noted to have swelling in her left knee.  CT scans ordered to ensure there is no brain bleed.  X-rays ordered and show that she has tibial and fibular fracture proximally in her affected extremity.  I discussed the case with Dr. Ninfa Linden, orthopedics.  He would also want CT scan of the knee added to her work-up.  Patient will need admission to the hospital.  Final Clinical Impressions(s) / ED Diagnoses   Final diagnoses:  Closed fracture of proximal end of right tibia, unspecified fracture morphology, initial encounter  Closed fracture of both fibulae, initial encounter    ED Discharge Orders    None       Varney Biles, MD 06/21/19 6045    Varney Biles, MD 06/21/19 4098

## 2019-06-21 NOTE — Progress Notes (Signed)
Orthopaedic Trauma [facility]    Patient seen and evaluated Full consult to follow  BP 128/88 (BP Location: Right Arm)   Pulse (!) 126   Temp 98.5 F (36.9 C) (Oral)   Resp 18   SpO2 100%   INR: 1.9  Gen: Awake, pleasant but demented Right lower extremity  Knee immobilizer in place  Ice packs in place  Knee is flexed to about 30 to 35 degrees  Moderate ecchymosis present  Moderate edema present  Skin does not wrinkle with gentle compression of the lateral or medial proximal tibia  Fracture blister starting to form proximal lower leg  Compartments are firm  Extremity is warm, palpable dorsalis pedis pulse.  This is symmetric to the contralateral side  Brisk capillary refill (performed on 3rd toe)  No pain out of proportion with passive stretching of toes or ankle.  Patient is actively moving her toes and ankle without increasing pain.  Only has pain with direct palpation of her proximal tibia  Ankle and foot are nontender  Imaging  X-ray and CT scan shows comminuted right proximal tibia fracture with sagittally oriented fracture line extending into the joint.  Apex anterior deformity. proximal fibula fracture also noted.  A/P  84 year old female with baseline dementia and A. fib who resides in a memory care unit with comminuted right proximal tibia and fibula fractures  -Comminuted right tibial plateau fracture, comminuted right fibular fracture  Patient's fracture pattern is unstable and will require surgical stabilization.  At this time I think she has too much soft tissue swelling present even for limited ORIF.  I think she would benefit from application of a spanning external fixator at this time to allow for adequate stabilization of her fracture which would then in turn allow for soft tissue rest.  I do think that she would be able to undergo definitive fixation in about 7 days or so as long as she remains inpatient and we can continue with aggressive ice and elevation.   Discussed this with patient's daughter.  She is in agreement with the plan.   Current INR is 1.9   Repeat INR now   Possible OR this afternoon for external fixation right knee otherwise OR tomorrow for external fixation right knee versus ORIF if swelling improves over the next 24 hours   If unable to take to OR today will have pt placed into bulky compressive dressing to help with swelling control  Continue with ice and elevation   -medical issues  Per primary   - Chronic anticoagulation    Hold coumadin   - dispo   Possible OR this afternoon but more likely tomorrow am     Jari Pigg, PA-C [phone] (C) 06/21/2019, 1:56 PM  [facility] [place] [address] [phone] Domingo Sep (F)

## 2019-06-21 NOTE — Progress Notes (Addendum)
Orthopaedic Trauma Service Addendum  After further review and discussion we will proceed with temporary stabilization with well padded posterior long leg splint  Likely to OR on Thursday for definitive fixation but may need to delay until next week depending on condition of soft tissue   Soft tissue very friable and really the limiting factor in terms of proceeding to OR for definitive fixation   Continue with aggressive ice and elevation of R leg   Anticoagulation per medical team   Jari Pigg, PA-C [phone] (C) 06/21/2019, 6:18 PM  [facility] [place] [address] [phone] Domingo Sep (F)

## 2019-06-21 NOTE — Plan of Care (Signed)

## 2019-06-21 NOTE — ED Notes (Signed)
[facility] called for transport. 

## 2019-06-21 NOTE — Progress Notes (Signed)
Patient ID: Grace Zamora, female   DOB: 09/11/1935, 84 y.o.   MRN: [id number] She is on coumadin and has an INR of 1.9.  Blood thinners will need to be held given her significant fracture and soft-tissue swelling combined with the need to have her INR 1.5 or below in order to consider spinal anesthesia.

## 2019-06-21 NOTE — Progress Notes (Signed)
Notified [facility] about pt fluctuating HR between 120s and 140s. Not sustaining. Asymptomatic. Pt does have Hx of Afib.

## 2019-06-21 NOTE — ED Notes (Addendum)
Pt's HR increased to 140, and is in Afib.  [facility], as well as hospitalist.  Working on obtaining PO morning meds from pharmacy.

## 2019-06-21 NOTE — ED Notes (Signed)
ED TO INPATIENT HANDOFF REPORT  Name/Age/Gender Grace Zamora 84 y.o. female  Code Status   Home/SNF/Other Skilled nursing facility  Chief Complaint Fall; Right Knee pain  Level of Care/Admitting Diagnosis ED Disposition    ED Disposition Condition [facility]: [place] [[id number]]  Level of Care: Med-Surg [16]  Covid Evaluation: Person Under Investigation (PUI)  Isolation Risk Level: Low Risk/Droplet (Less than 4L [place] supplementation)  Diagnosis: Tibia/fibula fracture [220254]  Admitting Physician: Darliss Cheney [[id number]]  Attending Physician: Darliss Cheney [[id number]]  Estimated length of stay: 3 - 4 days  Certification:: I certify this patient will need inpatient services for at least 2 midnights  PT Class (Do Not Modify): Inpatient [101]  PT Acc Code (Do Not Modify): Private [1]       Medical History Past Medical History:  Diagnosis Date  . Atrial fibrillation ([place])   . CHF (congestive heart failure) ([place])   . Diabetes mellitus without complication ([facility])     Allergies Allergies  Allergen Reactions  . Lisinopril   . Niacin And Related   . Sulfa Antibiotics     IV Location/Drains/Wounds Patient Lines/Drains/Airways Status   Active Line/Drains/Airways    Name:   Placement date:   Placement time:   Site:   Days:   Peripheral IV 06/21/19 Left Hand   06/21/19    0618    Hand   less than 1          Labs/Imaging Results for orders placed or performed during the hospital encounter of 06/21/19 (from the past 48 hour(s))  Basic metabolic panel     Status: Abnormal   Collection Time: 06/21/19  6:01 AM  Result Value Ref Range   Sodium 140 135 - 145 mmol/L   Potassium 4.1 3.5 - 5.1 mmol/L   Chloride 106 98 - 111 mmol/L   CO2 23 22 - 32 mmol/L   Glucose, Bld 193 (H) 70 - 99 mg/dL   BUN 25 (H) 8 - 23 mg/dL   Creatinine, Ser 0.99 0.44 - 1.00 mg/dL   Calcium 9.2 8.9 - 10.3 mg/dL   GFR calc non Af Amer 53 (L) >60 mL/min   GFR  calc Af Amer >60 >60 mL/min   Anion gap 11 5 - 15    Comment: Performed at [facility], [place] [address] [place], [place] [address]  CBC with Differential     Status: Abnormal   Collection Time: 06/21/19  6:01 AM  Result Value Ref Range   WBC 12.2 (H) 4.0 - 10.5 K/uL   RBC 4.20 3.87 - 5.11 MIL/uL   Hemoglobin 12.7 12.0 - 15.0 g/dL   HCT 39.6 36.0 - 46.0 %   MCV 94.3 80.0 - 100.0 fL   MCH 30.2 26.0 - 34.0 pg   MCHC 32.1 30.0 - 36.0 g/dL   RDW 14.3 11.5 - 15.5 %   Platelets 214 150 - 400 K/uL   nRBC 0.0 0.0 - 0.2 %   Neutrophils Relative % 87 %   Neutro Abs 10.8 (H) 1.7 - 7.7 K/uL   Lymphocytes Relative 6 %   Lymphs Abs 0.7 0.7 - 4.0 K/uL   Monocytes Relative 5 %   Monocytes Absolute 0.6 0.1 - 1.0 K/uL   Eosinophils Relative 1 %   Eosinophils Absolute 0.1 0.0 - 0.5 K/uL   Basophils Relative 0 %   Basophils Absolute 0.0 0.0 - 0.1 K/uL   Immature Granulocytes 1 %  Abs Immature Granulocytes 0.07 0.00 - 0.07 K/uL    Comment: Performed at [facility], [address] [place], [place] [address]  Protime-INR     Status: Abnormal   Collection Time: 06/21/19  6:01 AM  Result Value Ref Range   Prothrombin Time 21.2 (H) 11.4 - 15.2 seconds   INR 1.9 (H) 0.8 - 1.2    Comment: (NOTE) INR goal varies based on device and disease states. Performed at [facility], [address] [place], [place] [address]   SARS Coronavirus 2 (CEPHEID - Performed in [facility] hospital lab), Hosp Order     Status: None   Collection Time: 06/21/19  6:01 AM   Specimen: Nasopharyngeal Swab  Result Value Ref Range   SARS Coronavirus 2 NEGATIVE NEGATIVE    Comment: (NOTE) If result is NEGATIVE SARS-CoV-2 target nucleic acids are NOT DETECTED. The SARS-CoV-2 RNA is generally detectable in upper and lower  respiratory specimens during the acute phase of infection. The lowest  concentration of SARS-CoV-2 viral copies this assay can detect is 250   copies / mL. A negative result does not preclude SARS-CoV-2 infection  and should not be used as the sole basis for treatment or other  patient management decisions.  A negative result may occur with  improper specimen collection / handling, submission of specimen other  than nasopharyngeal swab, presence of viral mutation(s) within the  areas targeted by this assay, and inadequate number of viral copies  (<250 copies / mL). A negative result must be combined with clinical  observations, patient history, and epidemiological information. If result is POSITIVE SARS-CoV-2 target nucleic acids are DETECTED. The SARS-CoV-2 RNA is generally detectable in upper and lower  respiratory specimens dur ing the acute phase of infection.  Positive  results are indicative of active infection with SARS-CoV-2.  Clinical  correlation with patient history and other diagnostic information is  necessary to determine patient infection status.  Positive results do  not rule out bacterial infection or co-infection with other viruses. If result is PRESUMPTIVE POSTIVE SARS-CoV-2 nucleic acids MAY BE PRESENT.   A presumptive positive result was obtained on the submitted specimen  and confirmed on repeat testing.  While 2019 novel coronavirus  (SARS-CoV-2) nucleic acids may be present in the submitted sample  additional confirmatory testing may be necessary for epidemiological  and / or clinical management purposes  to differentiate between  SARS-CoV-2 and other Sarbecovirus currently known to infect humans.  If clinically indicated additional testing with an alternate test  methodology [phone]([id number]) is advised. The SARS-CoV-2 RNA is generally  detectable in upper and lower respiratory sp ecimens during the acute  phase of infection. The expected result is Negative. Fact Sheet for Patients:  BoilerBrush.com.cyhttps://www.fda.gov/media/136312/download Fact Sheet for Healthcare  Providers: https://pope.com/https://www.fda.gov/media/136313/download This test is not yet approved or cleared by the [place] FDA and has been authorized for detection and/or diagnosis of SARS-CoV-2 by FDA under an Emergency Use Authorization (EUA).  This EUA will remain in effect (meaning this test can be used) for the duration of the COVID-19 declaration under Section 564(b)(1) of the Act, 21 U.S.C. section 360bbb-3(b)(1), unless the authorization is terminated or revoked sooner. Performed at [facility], [address] [place], [place] [address]   Type and screen     Status: None   Collection Time: 06/21/19  6:01 AM  Result Value Ref Range   ABO/RH(D) O POS    Antibody Screen NEG  Sample Expiration      06/24/2019,2359 Performed at [facility], [address] [place], [place] [address]   ABO/Rh     Status: None (Preliminary result)   Collection Time: 06/21/19  6:01 AM  Result Value Ref Range   ABO/RH(D)      O POS Performed at [facility], [address] [place], [place] [address]    Ct Head Wo Contrast  Result Date: 06/21/2019 CLINICAL DATA:  Fall. EXAM: CT HEAD WITHOUT CONTRAST CT CERVICAL SPINE WITHOUT CONTRAST TECHNIQUE: Multidetector CT imaging of the head and cervical spine was performed following the standard protocol without intravenous contrast. Multiplanar CT image reconstructions of the cervical spine were also generated. COMPARISON:  CT scan of Apr 25, 2019. FINDINGS: CT HEAD FINDINGS Brain: Mild diffuse cortical atrophy is noted. Mild chronic ischemic white matter disease is noted. No mass effect or midline shift is noted. Ventricular size is within normal limits. There is no evidence of mass lesion, hemorrhage or acute infarction. Vascular: No hyperdense vessel or unexpected calcification. Skull: Normal. Negative for fracture or focal lesion. Sinuses/Orbits: No acute finding. Other: None. CT CERVICAL SPINE FINDINGS  Alignment: Stable grade 1 anterolisthesis of C4-5 is noted secondary to posterior facet joint hypertrophy. Pole C7 fracture is noted. No acute fracture is noted. Skull base and vertebrae: No acute fracture. No primary bone lesion or focal pathologic process. Soft tissues and spinal canal: No prevertebral fluid or swelling. No visible canal hematoma. Disc levels:  Severe degenerative disc disease is noted at C5-6. Upper chest: Negative. Other: Degenerative changes are seen involving posterior facet joints bilaterally. IMPRESSION: Mild diffuse cortical atrophy. Mild chronic ischemic white matter disease. No acute intracranial abnormality seen. Stable degenerative changes are noted in cervical spine as described above. No acute abnormality is noted. Electronically Signed   By: Lupita RaiderJames  Green Jr M.D.   On: 06/21/2019 07:39   Ct Cervical Spine Wo Contrast  Result Date: 06/21/2019 CLINICAL DATA:  Fall. EXAM: CT HEAD WITHOUT CONTRAST CT CERVICAL SPINE WITHOUT CONTRAST TECHNIQUE: Multidetector CT imaging of the head and cervical spine was performed following the standard protocol without intravenous contrast. Multiplanar CT image reconstructions of the cervical spine were also generated. COMPARISON:  CT scan of Apr 25, 2019. FINDINGS: CT HEAD FINDINGS Brain: Mild diffuse cortical atrophy is noted. Mild chronic ischemic white matter disease is noted. No mass effect or midline shift is noted. Ventricular size is within normal limits. There is no evidence of mass lesion, hemorrhage or acute infarction. Vascular: No hyperdense vessel or unexpected calcification. Skull: Normal. Negative for fracture or focal lesion. Sinuses/Orbits: No acute finding. Other: None. CT CERVICAL SPINE FINDINGS Alignment: Stable grade 1 anterolisthesis of C4-5 is noted secondary to posterior facet joint hypertrophy. Pole C7 fracture is noted. No acute fracture is noted. Skull base and vertebrae: No acute fracture. No primary bone lesion or focal  pathologic process. Soft tissues and spinal canal: No prevertebral fluid or swelling. No visible canal hematoma. Disc levels:  Severe degenerative disc disease is noted at C5-6. Upper chest: Negative. Other: Degenerative changes are seen involving posterior facet joints bilaterally. IMPRESSION: Mild diffuse cortical atrophy. Mild chronic ischemic white matter disease. No acute intracranial abnormality seen. Stable degenerative changes are noted in cervical spine as described above. No acute abnormality is noted. Electronically Signed   By: Lupita RaiderJames  Green Jr M.D.   On: 06/21/2019 07:39   Ct Knee Right Wo Contrast  Result Date: 06/21/2019 CLINICAL DATA:  Evaluate complex  tibia fracture. EXAM: CT OF THE right KNEE WITHOUT CONTRAST TECHNIQUE: Multidetector CT imaging of the technique right knee was performed according to the standard protocol. Multiplanar CT image reconstructions were also generated. COMPARISON:  Radiographs same date. FINDINGS: There is an inverted T-shaped proximal tibia fracture with a long longitudinal fracture extending between the tibial spines down to the transverse component of the fracture at the metadiaphyseal junction. There is 1 cm of lateral displacement and 9 mm of posterior displacement of the transverse fracture. Associated comminuted fracture of the fibular head and neck. No fracture of the femur or patella. There is a large joint effusion/lipohemarthrosis. There is also extensive subcutaneous soft tissue swelling/edema/hemorrhage in the anterior soft tissues. Grossly the cruciate and collateral ligaments are intact and the quadriceps and patellar tendons are intact. Moderate to advanced vascular calcifications noted. IMPRESSION: 1. Inverted T shaped fracture of the proximal tibia as detailed above. 2. Comminuted fracture of the fibular head and neck. 3. Large lipohemarthrosis. 4. Grossly intact knee ligaments. Electronically Signed   By: Rudie MeyerP.  Gallerani M.D.   On: 06/21/2019 08:00    Dg Knee Complete 4 Views Right  Result Date: 06/21/2019 CLINICAL DATA:  Fall. EXAM: RIGHT KNEE - COMPLETE 4+ VIEW COMPARISON:  None. FINDINGS: Tibial metaphysis fracture with dorsal impaction. No visible extension to the joint space. There is soft tissue swelling and joint effusion. Fibular neck is likely also fractured. Prominent osteopenia. IMPRESSION: 1. Angulated tibial metaphysis fracture. 2. Fibular neck fracture. Electronically Signed   By: Marnee SpringJonathon  Watts M.D.   On: 06/21/2019 05:28   Dg Hips Bilat W Or Wo Pelvis 3-4 Views  Result Date: 06/21/2019 CLINICAL DATA:  Fall. EXAM: DG HIP (WITH OR WITHOUT PELVIS) 3-4V BILAT COMPARISON:  None. FINDINGS: Limited under penetrated study with rotation on the pelvis view. No evidence of hip fracture or dislocation. There is a bipolar left hip hemiarthroplasty. IMPRESSION: No acute finding. Electronically Signed   By: Marnee SpringJonathon  Watts M.D.   On: 06/21/2019 05:29    Pending Labs Unresulted Labs (From admission, onward)    Start     Ordered   Signed and Held  Comprehensive metabolic panel  Tomorrow morning,   R     Signed and Held   Signed and Held  CBC  Tomorrow morning,   R     Signed and Held          Vitals/Pain Today's Vitals   06/21/19 0400 06/21/19 0430 06/21/19 0643 06/21/19 0650  BP: (!) 154/94 134/85 (!) 127/98   Pulse: 76 62 86   Resp: 20 20 18    Temp:      TempSrc:      SpO2: 94% 95% 94%   PainSc:    9     Isolation Precautions No active isolations  Medications Medications  acetaminophen (TYLENOL) tablet 650 mg (650 mg Oral Given 06/21/19 0438)  fentaNYL (SUBLIMAZE) injection 50 mcg (50 mcg Intravenous Given 06/21/19 0812)    Mobility non-ambulatory

## 2019-06-21 NOTE — ED Notes (Signed)
Pt transported to Xray. 

## 2019-06-22 ENCOUNTER — Encounter (HOSPITAL_COMMUNITY): Payer: Self-pay | Admitting: *Deleted

## 2019-06-22 LAB — COMPREHENSIVE METABOLIC PANEL
ALT: 14 U/L (ref 0–44)
AST: 18 U/L (ref 15–41)
Albumin: 3.5 g/dL (ref 3.5–5.0)
Alkaline Phosphatase: 84 U/L (ref 38–126)
Anion gap: 12 (ref 5–15)
BUN: 17 mg/dL (ref 8–23)
CO2: 24 mmol/L (ref 22–32)
Calcium: 9.3 mg/dL (ref 8.9–10.3)
Chloride: 101 mmol/L (ref 98–111)
Creatinine, Ser: 1.14 mg/dL — ABNORMAL HIGH (ref 0.44–1.00)
GFR calc Af Amer: 52 mL/min — ABNORMAL LOW (ref >60)
GFR calc non Af Amer: 45 mL/min — ABNORMAL LOW (ref >60)
Glucose, Bld: 171 mg/dL — ABNORMAL HIGH (ref 70–99)
Potassium: 3.9 mmol/L (ref 3.5–5.1)
Sodium: 137 mmol/L (ref 135–145)
Total Bilirubin: 1 mg/dL (ref 0.3–1.2)
Total Protein: 6.6 g/dL (ref 6.5–8.1)

## 2019-06-22 LAB — PROTIME-INR
INR: 2 — ABNORMAL HIGH (ref 0.8–1.2)
Prothrombin Time: 22.7 seconds — ABNORMAL HIGH (ref 11.4–15.2)

## 2019-06-22 LAB — GLUCOSE, CAPILLARY
Glucose-Capillary: 166 mg/dL — ABNORMAL HIGH (ref 70–99)
Glucose-Capillary: 172 mg/dL — ABNORMAL HIGH (ref 70–99)
Glucose-Capillary: 185 mg/dL — ABNORMAL HIGH (ref 70–99)
Glucose-Capillary: 206 mg/dL — ABNORMAL HIGH (ref 70–99)
Glucose-Capillary: 211 mg/dL — ABNORMAL HIGH (ref 70–99)

## 2019-06-22 LAB — CBC
HCT: 34.5 % — ABNORMAL LOW (ref 36.0–46.0)
Hemoglobin: 11.4 g/dL — ABNORMAL LOW (ref 12.0–15.0)
MCH: 30.3 pg (ref 26.0–34.0)
MCHC: 33 g/dL (ref 30.0–36.0)
MCV: 91.8 fL (ref 80.0–100.0)
Platelets: 210 10*3/uL (ref 150–400)
RBC: 3.76 MIL/uL — ABNORMAL LOW (ref 3.87–5.11)
RDW: 14.2 % (ref 11.5–15.5)
WBC: 9.6 10*3/uL (ref 4.0–10.5)
nRBC: 0 % (ref 0.0–0.2)

## 2019-06-22 LAB — ABO/RH: ABO/RH(D): O POS

## 2019-06-22 LAB — TYPE AND SCREEN
ABO/RH(D): O POS
Antibody Screen: NEGATIVE

## 2019-06-22 MED ORDER — DILTIAZEM HCL 30 MG PO TABS
30.0000 mg | ORAL_TABLET | Freq: Every day | ORAL | Status: AC
  Administered 2019-06-22: 23:00:00 30 mg via ORAL
  Filled 2019-06-22: qty 1

## 2019-06-22 MED ORDER — DILTIAZEM HCL ER COATED BEADS 180 MG PO CP24
180.0000 mg | ORAL_CAPSULE | Freq: Every day | ORAL | Status: DC
  Administered 2019-06-23 – 2019-06-26 (×4): 180 mg via ORAL
  Filled 2019-06-22 (×4): qty 1

## 2019-06-22 NOTE — Progress Notes (Signed)
Patient ID: Grace Zamora, female   DOB: 03/15/1935, 84 y.o.   MRN: [id number] Appears comfortable in her right leg splint today.  She will wiggle her toes for my. Her right tibia does not feel any different to palpation than it did yesterday.  I appreciate the consult by [facility].

## 2019-06-22 NOTE — Progress Notes (Signed)
RN called and updated pt's daughter, Horris Latino, of pt status. Horris Latino requesting to speak to Ainsley Spinner, PA regarding surgical plan. Lanny Hurst, [place] made aware. All questions answered to satisfaction. Will continue to monitor.

## 2019-06-22 NOTE — Progress Notes (Signed)
Per hospital policy, COVID 19 visitor restrictions remain in place. Pt's daughter, Grace Zamora, made aware and verbalized understanding. RN answered all questions to satisfaction. Will continue to monitor.

## 2019-06-22 NOTE — Progress Notes (Signed)
PROGRESS NOTE  Grace ReichertMary Babula ZOX:[id number] DOB: June 25, 1935 DOA: 06/21/2019 PCP: Eloisa NorthernAmin, Saad, MD  Brief History   Grace Zamora is a 84 y.o. female with medical history significant of exam is dementia, chronic atrial fibrillation on Coumadin, unknown type congestive heart failure, type 2 diabetes mellitus, hypertension was brought into the emergency department after a fall.  Patient is alert however she is oriented to her name and date of birth only and otherwise she is completely disoriented.  She could not even tell me where she lives that.  History was obtained from the ER charts and somewhat from her daughter Kendal HymenBonnie.  Reportedly, the staff at assisted living facility was getting her to the bathroom and on the way, she fell on her bottom.  When they were bringing her back to her bed, she fell again and this time she landed on her knee.  There was no head injury.  Patient started complaining of right knee so she was brought into the emergency department.  Patient had no other complaint.  Upon arrival to the emergency department, patient was hemodynamically stable.  Basic lab work was unremarkable however CT of the right knee discovered that she had proximal tibial fibular fracture.  Orthopedic surgery was consulted and EDP spoke to Dr. Magnus IvanBlackman who wanted the patient to be [facility] potential [facility] tomorrow.  Hospital service was consulted to admit the patient.  Her INR is 1.9.  CT head and CT cervical spine with no acute pathology/fracture.  The patient was [facility] on 06/21/2019. Dr. Magnus IvanBlackman evaluated the patient and determined that due to the complexity of the fracture she would be best served by consultation with Orthopedic Traumatologists Dr. Haddix/Dr. Carola FrostHandy. The patient was felt not to be appropriate for surgery at this time and was instead placed in t long leg splint with plans to take her to the OR next Thursday for definitive fixation.   The  patient is in atrial fibrillation with RVR this morning.  Consultants  . Orthopedic Surgery . Trauma Orthopedics  Procedures  . Splint placed.  Antibiotics   Anti-infectives (From admission, onward)   None    .   Subjective  The patient is resting comfortably this morning. No new complaints.  Objective   Vitals:  Vitals:   06/22/19 0821 06/22/19 0831  BP: 125/84   Pulse: 100 99  Resp: 18 16  Temp: 99.4 F (37.4 C)   SpO2: 94% 93%    Exam:  Constitutional:  . The patient is awake, alert, and oriented x 3. No acute distress. Respiratory:  . No increased work of breathing. . No wheezes, rales, or rhonchi. . No tactile fremitus. Cardiovascular:  . Regular rate and rhythm . No murmurs, ectopy, or gallups are auscultated . No lateral PMI. No thrills.  Abdomen:  . Abdomen is soft, non-tender, non-distended . No hernias, masses, or organomegaly are appreciated. . Normoactive bowel sounds. Musculoskeletal:  . Right leg splinted. Good capillary refill.  . Left lower extremity is without cyanosis, clubbing, or edema Skin:  . No rashes, lesions, ulcers . palpation of skin: no induration or nodules Neurologic:  . CN 2-12 intact . Sensation all 4 extremities intact Psychiatric:  . Mental status o Mood, affect appropriate o Orientation to person, place, time  . judgment and insight appear intact    I have personally reviewed the following:   Today's Data  . Vitals, CBC, BMP  Scheduled Meds: . diltiazem  120 mg  Oral Daily  . donepezil  10 mg Oral QHS  . ferrous sulfate  325 mg Oral BID WC  . fluticasone furoate-vilanterol  1 puff Inhalation Daily  . furosemide  40 mg Oral BID  . gabapentin  100 mg Oral BID  . insulin aspart  0-15 Units Subcutaneous TID WC  . insulin aspart  0-5 Units Subcutaneous QHS  . insulin glargine  12 Units Subcutaneous QPM  . memantine  14 mg Oral QPM  . metoprolol tartrate  25 mg Oral BID  . mirtazapine  7.5 mg Oral QHS  .  pantoprazole  40 mg Oral Daily  . potassium chloride SA  20 mEq Oral BID  . senna-docusate  1 tablet Oral Once per day on Mon Wed Fri  . sertraline  25 mg Oral Daily  . traZODone  50 mg Oral QHS   Continuous Infusions: . sodium chloride 50 mL/hr at 06/21/19 1258    Active Problems:   Tibia/fibula fracture   Fall at home, initial encounter   Atrial fibrillation, chronic   Essential hypertension   Alzheimer's dementia ([place])   Type 2 diabetes mellitus ([place])   LOS: 1 day   A & P  Fall/right tibial-fibular fracture: Orthopedic consulted.  Pt was then evaluated by trauma-ortho service. It was determined that there is currently too much swelling going on. The patient was placed in a long leg splint. Plan is for her to go to OR on 06/24/2019 for definitive fixation.  Chronic atrial fibrillation: She was in atrial fibrillation with RVR this morning. Now rates are better controlled.  We will resume her beta-blocker and calcium channel blocker to control her rate and monitor on telemetry. I will give her a one time dose of diltiazem 30 mg tonight and then increase her dose of long acting diltiazem in the am.   Hold Coumadin for potential surgery tomorrow.  Repeat INR in the morning.  Hypertension: Controlled.  Resume home medications.  Type 2 diabetes mellitus: She is on Lantus 22 units at home.  She takes at night.  Since she is going to be n.p.o. from midnight so I will give her 12 units at night along with SSI.  Check hemoglobin A1c.  Chronic congestive heart failure: Unknown type as we do not have any echo in the record.  Will resume her Lasix and potassium chloride.  DVT prophylaxis: SCD.  INR 1.9.  Holding Coumadin for surgery on Thursday 06/24/2019. Code Status: DNR, confirmed from her daughter Family Communication: I I have called the daughter Reena Borromeo @ [phone]. All questions were answered to the best of my ability  Disposition Plan: May likely need SNF upon discharge in next  3 to 4 days.  Jahsiah Carpenter, DO [facility] Direct contact: see [email or web address]  7PM-7AM contact night coverage as above  06/22/2019, 11:20 AM  LOS: 1 day

## 2019-06-22 NOTE — Plan of Care (Signed)
Problem: Education: Goal: Knowledge of General Education information will improve Description: Including pain rating scale, medication(s)/side effects and non-pharmacologic comfort measures Outcome: Progressing   Problem: Health Behavior/Discharge Planning: Goal: Ability to manage health-related needs will improve Outcome: Progressing   Problem: Clinical Measurements: Goal: Ability to maintain clinical measurements within normal limits will improve Outcome: Progressing  Goal: Will remain free from infection Outcome: Progressing  Goal: Respiratory complications will improve Outcome: Progressing  Goal: Cardiovascular complication will be avoided Outcome: Progressing   Problem: Nutrition: Goal: Adequate nutrition will be maintained Outcome: Progressing   Problem: Elimination: Goal: Will not experience complications related to urinary retention Outcome: Progressing   Problem: Pain Managment: Goal: General experience of comfort will improve Outcome: Progressing   Problem: Safety: Goal: Ability to remain free from injury will improve Outcome: Progressing   Problem: Skin Integrity: Goal: Risk for impaired skin integrity will decrease Outcome: Progressing   

## 2019-06-23 ENCOUNTER — Encounter (HOSPITAL_COMMUNITY): Payer: Self-pay | Admitting: *Deleted

## 2019-06-23 ENCOUNTER — Inpatient Hospital Stay (HOSPITAL_COMMUNITY): Payer: Medicare Other | Admitting: Registered Nurse

## 2019-06-23 ENCOUNTER — Encounter (HOSPITAL_COMMUNITY)
Admission: EM | Disposition: A | Discharge status: Nursing Home (Includes ICF) | Payer: Self-pay | Source: Skilled Nursing Facility | Attending: Internal Medicine

## 2019-06-23 ENCOUNTER — Inpatient Hospital Stay (HOSPITAL_COMMUNITY): Payer: Medicare Other

## 2019-06-23 HISTORY — PX: ORIF TIBIA FRACTURE: SHX5416

## 2019-06-23 LAB — COMPREHENSIVE METABOLIC PANEL
ALT: 13 U/L (ref 0–44)
AST: 16 U/L (ref 15–41)
Albumin: 3.3 g/dL — ABNORMAL LOW (ref 3.5–5.0)
Alkaline Phosphatase: 77 U/L (ref 38–126)
Anion gap: 12 (ref 5–15)
BUN: 19 mg/dL (ref 8–23)
CO2: 25 mmol/L (ref 22–32)
Calcium: 8.7 mg/dL — ABNORMAL LOW (ref 8.9–10.3)
Chloride: 98 mmol/L (ref 98–111)
Creatinine, Ser: 1.27 mg/dL — ABNORMAL HIGH (ref 0.44–1.00)
GFR calc Af Amer: 46 mL/min — ABNORMAL LOW (ref >60)
GFR calc non Af Amer: 39 mL/min — ABNORMAL LOW (ref >60)
Glucose, Bld: 176 mg/dL — ABNORMAL HIGH (ref 70–99)
Potassium: 3.2 mmol/L — ABNORMAL LOW (ref 3.5–5.1)
Sodium: 135 mmol/L (ref 135–145)
Total Bilirubin: 1 mg/dL (ref 0.3–1.2)
Total Protein: 6.3 g/dL — ABNORMAL LOW (ref 6.5–8.1)

## 2019-06-23 LAB — CBC
HCT: 32 % — ABNORMAL LOW (ref 36.0–46.0)
Hemoglobin: 10.8 g/dL — ABNORMAL LOW (ref 12.0–15.0)
MCH: 30.6 pg (ref 26.0–34.0)
MCHC: 33.8 g/dL (ref 30.0–36.0)
MCV: 90.7 fL (ref 80.0–100.0)
Platelets: 205 10*3/uL (ref 150–400)
RBC: 3.53 MIL/uL — ABNORMAL LOW (ref 3.87–5.11)
RDW: 13.7 % (ref 11.5–15.5)
WBC: 10.8 10*3/uL — ABNORMAL HIGH (ref 4.0–10.5)
nRBC: 0 % (ref 0.0–0.2)

## 2019-06-23 LAB — GLUCOSE, CAPILLARY
Glucose-Capillary: 158 mg/dL — ABNORMAL HIGH (ref 70–99)
Glucose-Capillary: 196 mg/dL — ABNORMAL HIGH (ref 70–99)
Glucose-Capillary: 299 mg/dL — ABNORMAL HIGH (ref 70–99)

## 2019-06-23 SURGERY — OPEN REDUCTION INTERNAL FIXATION (ORIF) TIBIA FRACTURE
Anesthesia: General | Laterality: Right

## 2019-06-23 MED ORDER — CEFAZOLIN SODIUM-DEXTROSE 2-4 GM/100ML-% IV SOLN
2.0000 g | Freq: Three times a day (TID) | INTRAVENOUS | Status: AC
  Administered 2019-06-23 – 2019-06-24 (×3): 2 g via INTRAVENOUS
  Filled 2019-06-23 (×3): qty 100

## 2019-06-23 MED ORDER — VANCOMYCIN HCL 1000 MG IV SOLR
INTRAVENOUS | Status: AC
  Filled 2019-06-23: qty 1000

## 2019-06-23 MED ORDER — SUCCINYLCHOLINE CHLORIDE 20 MG/ML IJ SOLN
INTRAMUSCULAR | Status: DC | PRN
  Administered 2019-06-23: 100 mg via INTRAVENOUS

## 2019-06-23 MED ORDER — DEXAMETHASONE SODIUM PHOSPHATE 10 MG/ML IJ SOLN
INTRAMUSCULAR | Status: AC
  Filled 2019-06-23: qty 1

## 2019-06-23 MED ORDER — PROPOFOL 10 MG/ML IV BOLUS
INTRAVENOUS | Status: AC
  Filled 2019-06-23: qty 20

## 2019-06-23 MED ORDER — PHENYLEPHRINE HCL (PRESSORS) 10 MG/ML IV SOLN
INTRAVENOUS | Status: AC
  Filled 2019-06-23: qty 3

## 2019-06-23 MED ORDER — ACETAMINOPHEN 10 MG/ML IV SOLN
INTRAVENOUS | Status: DC | PRN
  Administered 2019-06-23: 1000 mg via INTRAVENOUS

## 2019-06-23 MED ORDER — DEXAMETHASONE SODIUM PHOSPHATE 10 MG/ML IJ SOLN
INTRAMUSCULAR | Status: DC | PRN
  Administered 2019-06-23: 5 mg via INTRAVENOUS

## 2019-06-23 MED ORDER — EPHEDRINE SULFATE-NACL 50-0.9 MG/10ML-% IV SOSY
PREFILLED_SYRINGE | INTRAVENOUS | Status: DC | PRN
  Administered 2019-06-23: 10 mg via INTRAVENOUS

## 2019-06-23 MED ORDER — LACTATED RINGERS IV SOLN
INTRAVENOUS | Status: DC
  Administered 2019-06-23 (×2): via INTRAVENOUS

## 2019-06-23 MED ORDER — ACETAMINOPHEN 325 MG PO TABS
650.0000 mg | ORAL_TABLET | Freq: Four times a day (QID) | ORAL | Status: DC
  Administered 2019-06-23 – 2019-06-26 (×9): 650 mg via ORAL
  Filled 2019-06-23 (×8): qty 2

## 2019-06-23 MED ORDER — MEPERIDINE HCL 25 MG/ML IJ SOLN: 6.2500 mg | INTRAMUSCULAR | Status: DC | PRN

## 2019-06-23 MED ORDER — PHENYLEPHRINE HCL (PRESSORS) 10 MG/ML IV SOLN
INTRAVENOUS | Status: DC | PRN
  Administered 2019-06-23 (×2): 80 ug via INTRAVENOUS
  Administered 2019-06-23: 120 ug via INTRAVENOUS

## 2019-06-23 MED ORDER — FENTANYL CITRATE (PF) 250 MCG/5ML IJ SOLN
INTRAMUSCULAR | Status: AC
  Filled 2019-06-23: qty 5

## 2019-06-23 MED ORDER — PROPOFOL 10 MG/ML IV BOLUS
INTRAVENOUS | Status: DC | PRN
  Administered 2019-06-23: 120 mg via INTRAVENOUS

## 2019-06-23 MED ORDER — ONDANSETRON HCL 4 MG/2ML IJ SOLN
INTRAMUSCULAR | Status: AC
  Filled 2019-06-23: qty 2

## 2019-06-23 MED ORDER — GABAPENTIN 100 MG PO CAPS
100.0000 mg | ORAL_CAPSULE | Freq: Three times a day (TID) | ORAL | Status: DC
  Administered 2019-06-23 – 2019-06-26 (×10): 100 mg via ORAL
  Filled 2019-06-23 (×10): qty 1

## 2019-06-23 MED ORDER — SUCCINYLCHOLINE CHLORIDE 200 MG/10ML IV SOSY
PREFILLED_SYRINGE | INTRAVENOUS | Status: AC
  Filled 2019-06-23: qty 10

## 2019-06-23 MED ORDER — ROCURONIUM BROMIDE 10 MG/ML (PF) SYRINGE
PREFILLED_SYRINGE | INTRAVENOUS | Status: AC
  Filled 2019-06-23: qty 10

## 2019-06-23 MED ORDER — FENTANYL CITRATE (PF) 100 MCG/2ML IJ SOLN
INTRAMUSCULAR | Status: DC | PRN
  Administered 2019-06-23 (×2): 50 ug via INTRAVENOUS

## 2019-06-23 MED ORDER — PHENYLEPHRINE 40 MCG/ML (10ML) SYRINGE FOR IV PUSH (FOR BLOOD PRESSURE SUPPORT)
PREFILLED_SYRINGE | INTRAVENOUS | Status: DC | PRN
  Administered 2019-06-23 (×2): 120 ug via INTRAVENOUS

## 2019-06-23 MED ORDER — POTASSIUM CHLORIDE 10 MEQ/100ML IV SOLN
10.0000 meq | INTRAVENOUS | Status: AC
  Administered 2019-06-23 (×2): 10 meq via INTRAVENOUS
  Filled 2019-06-23 (×2): qty 100

## 2019-06-23 MED ORDER — CEFAZOLIN SODIUM-DEXTROSE 2-3 GM-%(50ML) IV SOLR
INTRAVENOUS | Status: DC | PRN
  Administered 2019-06-23: 2 g via INTRAVENOUS

## 2019-06-23 MED ORDER — ROCURONIUM BROMIDE 50 MG/5ML IV SOSY
PREFILLED_SYRINGE | INTRAVENOUS | Status: DC | PRN
  Administered 2019-06-23: 30 mg via INTRAVENOUS

## 2019-06-23 MED ORDER — ACETAMINOPHEN 10 MG/ML IV SOLN
INTRAVENOUS | Status: AC
  Filled 2019-06-23: qty 100

## 2019-06-23 MED ORDER — LIDOCAINE 2% (20 MG/ML) 5 ML SYRINGE
INTRAMUSCULAR | Status: DC | PRN
  Administered 2019-06-23: 100 mg via INTRAVENOUS

## 2019-06-23 MED ORDER — PROMETHAZINE HCL 25 MG/ML IJ SOLN: 6.2500 mg | INTRAMUSCULAR | Status: DC | PRN

## 2019-06-23 MED ORDER — LIDOCAINE 2% (20 MG/ML) 5 ML SYRINGE
INTRAMUSCULAR | Status: AC
  Filled 2019-06-23: qty 5

## 2019-06-23 MED ORDER — VANCOMYCIN HCL 1000 MG IV SOLR
INTRAVENOUS | Status: DC | PRN
  Administered 2019-06-23: 1000 mg via TOPICAL

## 2019-06-23 MED ORDER — LACTATED RINGERS IV SOLN: INTRAVENOUS | Status: DC

## 2019-06-23 MED ORDER — SODIUM CHLORIDE 0.9 % IV SOLN
INTRAVENOUS | Status: DC | PRN
  Administered 2019-06-23: 35 ug/min via INTRAVENOUS

## 2019-06-23 MED ORDER — PHENYLEPHRINE 40 MCG/ML (10ML) SYRINGE FOR IV PUSH (FOR BLOOD PRESSURE SUPPORT)
PREFILLED_SYRINGE | INTRAVENOUS | Status: AC
  Filled 2019-06-23: qty 10

## 2019-06-23 MED ORDER — FENTANYL CITRATE (PF) 100 MCG/2ML IJ SOLN
25.0000 ug | INTRAMUSCULAR | Status: DC | PRN
  Administered 2019-06-23 (×2): 50 ug via INTRAVENOUS

## 2019-06-23 MED ORDER — 0.9 % SODIUM CHLORIDE (POUR BTL) OPTIME
TOPICAL | Status: DC | PRN
  Administered 2019-06-23: 12:00:00 1000 mL

## 2019-06-23 MED ORDER — FENTANYL CITRATE (PF) 100 MCG/2ML IJ SOLN
INTRAMUSCULAR | Status: AC
  Filled 2019-06-23: qty 2

## 2019-06-23 MED ORDER — SUGAMMADEX SODIUM 200 MG/2ML IV SOLN
INTRAVENOUS | Status: DC | PRN
  Administered 2019-06-23: 60 mg via INTRAVENOUS
  Administered 2019-06-23: 100 mg via INTRAVENOUS

## 2019-06-23 MED ORDER — EPHEDRINE 5 MG/ML INJ
INTRAVENOUS | Status: AC
  Filled 2019-06-23: qty 10

## 2019-06-23 MED ORDER — HYDROMORPHONE HCL 1 MG/ML IJ SOLN: 0.5000 mg | INTRAMUSCULAR | Status: DC | PRN

## 2019-06-23 MED ORDER — ONDANSETRON HCL 4 MG/2ML IJ SOLN
INTRAMUSCULAR | Status: DC | PRN
  Administered 2019-06-23: 4 mg via INTRAVENOUS

## 2019-06-23 SURGICAL SUPPLY — 77 items
BANDAGE ACE 4X5 VEL STRL LF (GAUZE/BANDAGES/DRESSINGS) ×3 IMPLANT
BANDAGE ACE 6X5 VEL STRL LF (GAUZE/BANDAGES/DRESSINGS) ×3 IMPLANT
BANDAGE ELASTIC 4 VELCRO ST LF (GAUZE/BANDAGES/DRESSINGS) ×1 IMPLANT
BANDAGE ELASTIC 6 VELCRO ST LF (GAUZE/BANDAGES/DRESSINGS) ×1 IMPLANT
BANDAGE ESMARK 6X9 LF (GAUZE/BANDAGES/DRESSINGS) ×1 IMPLANT
BENZOIN TINCTURE PRP APPL 2/3 (GAUZE/BANDAGES/DRESSINGS) ×2 IMPLANT
BIT DRILL 3.3 LONG (BIT) ×2 IMPLANT
BIT DRILL QC 3.3X195 (BIT) ×2 IMPLANT
BLADE CLIPPER SURG (BLADE) IMPLANT
BNDG COHESIVE 4X5 TAN STRL (GAUZE/BANDAGES/DRESSINGS) IMPLANT
BNDG ESMARK 6X9 LF (GAUZE/BANDAGES/DRESSINGS) ×3
BNDG GAUZE ELAST 4 BULKY (GAUZE/BANDAGES/DRESSINGS) ×3 IMPLANT
BRUSH SCRUB SURG 4.25 DISP (MISCELLANEOUS) ×6 IMPLANT
CAP LOCK NCB (Cap) ×12 IMPLANT
CHLORAPREP W/TINT 26 (MISCELLANEOUS) ×3 IMPLANT
CLOSURE WOUND 1/2 X4 (GAUZE/BANDAGES/DRESSINGS) ×1
COVER MAYO STAND STRL (DRAPES) ×3 IMPLANT
COVER WAND RF STERILE (DRAPES) ×3 IMPLANT
DRAPE C-ARM 42X72 X-RAY (DRAPES) ×3 IMPLANT
DRAPE C-ARMOR (DRAPES) ×3 IMPLANT
DRAPE HALF SHEET 40X57 (DRAPES) ×6 IMPLANT
DRAPE INCISE IOBAN 66X45 STRL (DRAPES) ×3 IMPLANT
DRAPE U-SHAPE 47X51 STRL (DRAPES) ×3 IMPLANT
DRSG ADAPTIC 3X8 NADH LF (GAUZE/BANDAGES/DRESSINGS) ×3 IMPLANT
DRSG PAD ABDOMINAL 8X10 ST (GAUZE/BANDAGES/DRESSINGS) ×12 IMPLANT
ELECT REM PT RETURN 9FT ADLT (ELECTROSURGICAL) ×3
ELECTRODE REM PT RTRN 9FT ADLT (ELECTROSURGICAL) ×1 IMPLANT
GAUZE SPONGE 4X4 12PLY STRL (GAUZE/BANDAGES/DRESSINGS) ×5 IMPLANT
GLOVE BIO SURGEON STRL SZ 6.5 (GLOVE) ×6 IMPLANT
GLOVE BIO SURGEON STRL SZ7.5 (GLOVE) ×12 IMPLANT
GLOVE BIO SURGEONS STRL SZ 6.5 (GLOVE) ×3
GLOVE BIOGEL PI IND STRL 6.5 (GLOVE) ×1 IMPLANT
GLOVE BIOGEL PI IND STRL 7.5 (GLOVE) ×1 IMPLANT
GLOVE BIOGEL PI INDICATOR 6.5 (GLOVE) ×2
GLOVE BIOGEL PI INDICATOR 7.5 (GLOVE) ×2
GLOVE PROGUARD SZ 7 1/2 (GLOVE) ×3 IMPLANT
GOWN STRL REUS W/ TWL LRG LVL3 (GOWN DISPOSABLE) ×2 IMPLANT
GOWN STRL REUS W/TWL LRG LVL3 (GOWN DISPOSABLE) ×4
K-WIRE 2.0 (WIRE) ×4
K-WIRE FXSTD 280X2XNS SS (WIRE) ×2
KIT BASIN OR (CUSTOM PROCEDURE TRAY) ×3 IMPLANT
KIT TURNOVER KIT B (KITS) ×3 IMPLANT
KWIRE FXSTD 280X2XNS SS (WIRE) IMPLANT
MANIFOLD NEPTUNE II (INSTRUMENTS) ×3 IMPLANT
NS IRRIG 1000ML POUR BTL (IV SOLUTION) ×3 IMPLANT
PACK TOTAL JOINT (CUSTOM PROCEDURE TRAY) ×3 IMPLANT
PAD ARMBOARD 7.5X6 YLW CONV (MISCELLANEOUS) ×6 IMPLANT
PAD CAST 4YDX4 CTTN HI CHSV (CAST SUPPLIES) ×1 IMPLANT
PADDING CAST COTTON 4X4 STRL (CAST SUPPLIES) ×2
PADDING CAST COTTON 6X4 STRL (CAST SUPPLIES) ×3 IMPLANT
PLATE NCB LAT PROX 3H TIBIA 9H (Plate) ×2 IMPLANT
SCREW NCB 4.0MX30M (Screw) ×4 IMPLANT
SCREW NCB 4.0MX34M (Screw) ×2 IMPLANT
SCREW NCB 4.0X75 CORT S/T (Screw) ×4 IMPLANT
SCREW PROX ST NCB 4X80 (Screw) ×4 IMPLANT
SPONGE LAP 18X18 RF (DISPOSABLE) IMPLANT
STAPLER VISISTAT 35W (STAPLE) ×3 IMPLANT
STOCKINETTE IMPERVIOUS LG (DRAPES) IMPLANT
STRIP CLOSURE SKIN 1/2X4 (GAUZE/BANDAGES/DRESSINGS) ×1 IMPLANT
SUCTION FRAZIER HANDLE 10FR (MISCELLANEOUS) ×2
SUCTION TUBE FRAZIER 10FR DISP (MISCELLANEOUS) ×1 IMPLANT
SUT ETHILON 3 0 PS 1 (SUTURE) IMPLANT
SUT MNCRL AB 3-0 PS2 18 (SUTURE) ×7 IMPLANT
SUT PROLENE 0 CT (SUTURE) IMPLANT
SUT VIC AB 0 CT1 27 (SUTURE) ×6
SUT VIC AB 0 CT1 27XBRD ANBCTR (SUTURE) ×1 IMPLANT
SUT VIC AB 1 CT1 27 (SUTURE) ×2
SUT VIC AB 1 CT1 27XBRD ANBCTR (SUTURE) ×1 IMPLANT
SUT VIC AB 2-0 CT1 27 (SUTURE) ×6
SUT VIC AB 2-0 CT1 TAPERPNT 27 (SUTURE) ×2 IMPLANT
TOWEL GREEN STERILE (TOWEL DISPOSABLE) ×6 IMPLANT
TOWEL GREEN STERILE FF (TOWEL DISPOSABLE) ×3 IMPLANT
TRAY FOLEY MTR SLVR 16FR STAT (SET/KITS/TRAYS/PACK) IMPLANT
TUBE CONNECTING 12'X1/4 (SUCTIONS) ×1
TUBE CONNECTING 12X1/4 (SUCTIONS) ×2 IMPLANT
WATER STERILE IRR 1000ML POUR (IV SOLUTION) ×6 IMPLANT
YANKAUER SUCT BULB TIP NO VENT (SUCTIONS) ×3 IMPLANT

## 2019-06-23 NOTE — H&P (View-Only) (Signed)
Ortho Trauma Progress Note  Patient seen this AM and swelling is improved. Feel that it is appropriate to proceed with ORIF. Do not feel like swelling will improve much more over next few days and risk of being immobile is higher in my opinion than proceeding with ORIF is most appropriate. I have made patient NPO and placed her on the surgery schedule. Discussed with her daughter and talked about risks and benefits and she agreed to proceed with surgery.  Shona Needles, MD [facility] [phone] (phone) [phone] (office) [email or web address]

## 2019-06-23 NOTE — Care Management Important Message (Signed)
Important Message  Patient Details  Name: Grace Zamora MRN: [id number] Date of Birth: [birth year]   Medicare Important Message Given:  Yes     Memory Argue 06/23/2019, 4:05 PM

## 2019-06-23 NOTE — Progress Notes (Signed)
PROGRESS NOTE  Grace Zamora EGB:[id number] DOB: Apr 27, 1935 DOA: 06/21/2019 PCP: Garwin Brothers, MD  Brief History   Grace Zamora is a 84 y.o. female with medical history significant of exam is dementia, chronic atrial fibrillation on Coumadin, unknown type congestive heart failure, type 2 diabetes mellitus, hypertension was brought into the emergency department after a fall.  Patient is alert however she is oriented to her name and date of birth only and otherwise she is completely disoriented.  She could not even tell me where she lives that.  History was obtained from the ER charts and somewhat from her daughter Horris Latino.  Reportedly, the staff at assisted living facility was getting her to the bathroom and on the way, she fell on her bottom.  When they were bringing her back to her bed, she fell again and this time she landed on her knee.  There was no head injury.  Patient started complaining of right knee so she was brought into the emergency department.  Patient had no other complaint.  Upon arrival to the emergency department, patient was hemodynamically stable.  Basic lab work was unremarkable however CT of the right knee discovered that she had proximal tibial fibular fracture.  Orthopedic surgery was consulted and EDP spoke to Dr. Ninfa Linden who wanted the patient to be transported to [facility] for potential surgery tomorrow.  Hospital service was consulted to admit the patient.  Her INR is 1.9.  CT head and CT cervical spine with no acute pathology/fracture.  The patient was transferred to [facility] on 06/21/2019. Dr. Ninfa Linden evaluated the patient and determined that due to the complexity of the fracture she would be best served by consultation with Orthopedic Traumatologists Dr. Haddix/Dr. Marcelino Scot. The patient was felt not to be appropriate for surgery at this time and was instead placed in t long leg splint with plans to take her to the OR next Thursday for definitive fixation.    Consultants  . Orthopedic Surgery . Trauma Orthopedics  Procedures  . Splint placed.  Antibiotics   Anti-infectives (From admission, onward)   Start     Dose/Rate Route Frequency Ordered Stop   06/23/19 2000  ceFAZolin (ANCEF) IVPB 2g/100 mL premix     2 g 200 mL/hr over 30 Minutes Intravenous Every 8 hours 06/23/19 1347 06/24/19 2159   06/23/19 1158  vancomycin (VANCOCIN) powder  Status:  Discontinued       As needed 06/23/19 1159 06/23/19 1256      Subjective  The patient is resting comfortably this morning. No new complaints.  Objective   Vitals:  Vitals:   06/23/19 1318 06/23/19 1333  BP: (!) 113/56 115/69  Pulse: (!) 115 94  Resp: 20 13  Temp:  (!) 97.3 F (36.3 C)  SpO2: 98% 98%    Exam:  Constitutional:  . The patient is awake, alert, and oriented x 3. No acute distress. Respiratory:  . No increased work of breathing. . No wheezes, rales, or rhonchi. . No tactile fremitus. Cardiovascular:  . Regular rate and rhythm . No murmurs, ectopy, or gallups are auscultated . No lateral PMI. No thrills.  Abdomen:  . Abdomen is soft, non-tender, non-distended . No hernias, masses, or organomegaly are appreciated. . Normoactive bowel sounds. Musculoskeletal:  . Right leg splinted. Good capillary refill.  . Left lower extremity is without cyanosis, clubbing, or edema Skin:  . No rashes, lesions, ulcers . palpation of skin: no induration or nodules Neurologic:  . CN 2-12 intact .  Sensation all 4 extremities intact Psychiatric:  . Mental status o Mood, affect appropriate o Orientation to person, place, time  . judgment and insight appear intact    I have personally reviewed the following:   Today's Data  . Vitals, CBC, BMP  Scheduled Meds: . acetaminophen  650 mg Oral Q6H  . diltiazem  180 mg Oral Daily  . donepezil  10 mg Oral QHS  . fentaNYL      . ferrous sulfate  325 mg Oral BID WC  . fluticasone furoate-vilanterol  1 puff Inhalation Daily  .  furosemide  40 mg Oral BID  . gabapentin  100 mg Oral TID  . insulin aspart  0-15 Units Subcutaneous TID WC  . insulin aspart  0-5 Units Subcutaneous QHS  . insulin glargine  12 Units Subcutaneous QPM  . memantine  14 mg Oral QPM  . metoprolol tartrate  25 mg Oral BID  . mirtazapine  7.5 mg Oral QHS  . pantoprazole  40 mg Oral Daily  . potassium chloride SA  20 mEq Oral BID  . senna-docusate  1 tablet Oral Once per day on Mon Wed Fri  . sertraline  25 mg Oral Daily  . traZODone  50 mg Oral QHS   Continuous Infusions: . sodium chloride 50 mL/hr at 06/21/19 1258  .  ceFAZolin (ANCEF) IV    . lactated ringers 10 mL/hr at 06/23/19 1037    Active Problems:   Tibia/fibula fracture   Fall at home, initial encounter   Atrial fibrillation, chronic   Essential hypertension   Alzheimer's dementia ([facility])   Type 2 diabetes mellitus ([facility])   LOS: 2 days   A & P  Fall/right tibial-fibular fracture: Orthopedic consulted.  Pt was then evaluated by trauma-ortho service. It was determined that there is currently too much swelling going on. The patient was placed in a long leg splint. Plan is for her to go to OR  later today for definitive fixation.  Chronic atrial fibrillation: She was in atrial fibrillation with RVR this morning. Now rates are better controlled.  We will resume her beta-blocker and calcium channel blocker to control her rate and monitor on telemetry. I will give her a one time dose of diltiazem 30 mg tonight and then increase her dose of long acting diltiazem in the am.   Hold Coumadin for potential surgery tomorrow.  Hypertension: Controlled.  Resume home medications.  Type 2 diabetes mellitus: She is on Lantus 22 units at home.  She takes at night.  Since she is going to be n.p.o. from midnight so I will give her 12 units at night along with SSI.  Chronic congestive heart failure: Unknown type as we do not have any echo in the record.  Will resume her Lasix and potassium  chloride.  Hypokalemia: Supplement and monitor.  DVT prophylaxis: SCD.  INR 1.9.  Holding Coumadin for surgery on Thursday 06/24/2019. Code Status: DNR, confirmed from her daughter Family Communication: None available  Disposition Plan: May likely need SNF upon discharge in next 3 to 4 days.  Micajah Dennin, DO [facility] Direct contact: see [email or web address]  7PM-7AM contact night coverage as above  06/23/2019, 11:20 AM  LOS: 1 day

## 2019-06-23 NOTE — Progress Notes (Signed)
Ortho Trauma Progress Note  Patient seen this AM and swelling is improved. Feel that it is appropriate to proceed with ORIF. Do not feel like swelling will improve much more over next few days and risk of being immobile is higher in my opinion than proceeding with ORIF is most appropriate. I have made patient NPO and placed her on the surgery schedule. Discussed with her daughter and talked about risks and benefits and she agreed to proceed with surgery.  Kevin P. Haddix, MD [facility] [phone] (phone) [phone] (office) [email or web address] 

## 2019-06-23 NOTE — Anesthesia Postprocedure Evaluation (Signed)
Anesthesia Post Note  Patient: Grace Zamora  Procedure(s) Performed: OPEN REDUCTION INTERNAL FIXATION (ORIF) PROXIMAL TIBIA FRACTURE (Right )     Patient location during evaluation: PACU Anesthesia Type: General Level of consciousness: sedated and patient cooperative Pain management: pain level controlled Vital Signs Assessment: post-procedure vital signs reviewed and stable Respiratory status: spontaneous breathing Cardiovascular status: stable Anesthetic complications: no    Last Vitals:  Vitals:   06/23/19 1318 06/23/19 1333  BP: (!) 113/56 115/69  Pulse: (!) 115 94  Resp: 20 13  Temp:  (!) 36.3 C  SpO2: 98% 98%    Last Pain:  Vitals:   06/23/19 1333  TempSrc:   PainSc: [place]

## 2019-06-23 NOTE — Op Note (Signed)
Orthopaedic Surgery Operative Note (CSN: [id number] ) Date of Surgery: 06/23/2019  Admit Date: 06/21/2019   Diagnoses: Pre-Op Diagnoses: Right bicondylar tibial plateau fracture  Post-Op Diagnosis: Same  Procedures: CPT 65784-ONGE reduction internal fixation of right tibial plateau fracture  Surgeons : Primary: Shona Needles, MD  Assistant: Patrecia Pace, PA-C  Location: OR 3   Anesthesia:General  Antibiotics: Ancef 2g preop   Tourniquet time:None  Estimated Blood XBMW:41 mL  Complications:None   Specimens:None  Implants: Implant Name Type Inv. Item Serial No. Manufacturer Lot No. LRB No. Used Action  PLATE NCB LAT PROX 3H TIBIA 9H - [id number] Plate PLATE NCB LAT PROX 3H TIBIA 9H  [employer](ORTH,TRAU,BIO,SG)  Right 1 Implanted  SCREW PROX ST NCB 4X80 - [id number] Screw SCREW PROX ST NCB 4X80  [employer](ORTH,TRAU,BIO,SG)  Right 2 Implanted  SCREW NCB 4.0X75 CORT S/T - [id number] Screw SCREW NCB 4.0X75 CORT S/T  [employer](ORTH,TRAU,BIO,SG)  Right 2 Implanted  SCREW NCB 4.0MX34M - [id number] Screw SCREW NCB 4.0MX34M  [employer](ORTH,TRAU,BIO,SG)  Right 1 Implanted  SCREW NCB 4.0MX30M - [id number] Screw SCREW NCB 4.0MX30M  [employer](ORTH,TRAU,BIO,SG)  Right 2 Implanted  CAP LOCK NCB - [id number] Cap CAP LOCK NCB  [employer](ORTH,TRAU,BIO,SG)  Right 6 Implanted     Indications for Surgery: 84 year old female with a history of dementia on Coumadin that sustained a fall.  She had immediate pain and deformity to her right lower extremity.  X-ray showed a proximal tibia fracture with intra-articular extension.  Due to her anticoagulation as well as a severe swelling she was placed in a splint and allowed for the swelling to come down to an appropriate level for open reduction internal fixation.  I felt that due to her age and the amount of displacement that she needed fixation.  Risks and benefits were discussed with the patient's daughter.  Risks included but not limited  to bleeding, infection, malunion, nonunion, hardware failure, nerve and blood vessel injury, compartment syndrome, wound breakdown, knee stiffness, knee arthritis, even the possibility of loss of life.  The patient's daughter agreed to proceed with surgery and consent was obtained.  Operative Findings: Open reduction internal fixation of right tibial plateau fracture using [employer] proximal tibial NCB plate  Procedure: The patient was identified in the preoperative holding area. Consent was confirmed with the patient and their family and all questions were answered. The operative extremity was marked after confirmation with the patient. The patient was then brought back to the operating room by our anesthesia colleagues.  They were carefully transferred over to a radiolucent flat top table.  They were placed under general anesthetic.  A bump was placed in the operative hip.  A bone foam was placed under the leg.  The operative extremity was then prepped and draped in usual sterile fashion. A preoperative timeout was performed to verify the patient, the procedure, and the extremity. Preoperative antibiotics were dosed.  Fluoroscopic images were obtained to show the instability of the fracture.  I then attempted to aligned the fracture appropriately using closed means.  I made a limited incision over Gerdys and carried this down through skin and subcutaneous tissue.  I incised over the IT band tubercle I released this subperiosteally to expose the proximal tibia.  I used a Cobb elevator to release the muscle along the anterior lateral aspect of the tibia.  I then used fluoroscopy to choose an appropriate length plate.  I chose a 9 hole plate to use.  I then slid  this sub-muscular along the length of the tibia using my jig as a guide.  I used a reduction tenaculum to reduce this portion of the proximal shaft to the plate.  Alignment was appropriate and was held provisionally with K wires.  Once the plate  was confirmed and reduction was confirmed I then placed 4.0 millimeter screws in the proximal segment of the tibia.  I then used the jig to place percutaneous 4.0 millimeter screws through the plate.  Locking caps were placed at the proximal to shaft screws the last the distal screw was left without a locking cap to prevent a significant stress riser.  I then removed the jig and placed locking caps on the proximal screws.  Another screw in the metaphysis was placed.  The locking cap was placed this as well.  Final fluoroscopic images were obtained.  The wound was irrigated.  The IT band was closed over the plate using 0 Vicryl suture.  The skin was closed with 2-0 Vicryl and 3-0 monocryl.  Sterile dressing was placed with 4 x 4's and sterile cast padding was placed.  The patient was then awoken from anesthesia and taken to PACU in stable condition.  Post Op Plan/Instructions: The patient will be weightbearing as tolerated for transfers.  No immobilization is needed.  She will receive postoperative Ancef.  She may be restarted on her Coumadin tomorrow morning.  I was present and performed the entire surgery.  Ulyses SouthwardSarah Yacobi, PA-C did assist me throughout the case. An assistant was necessary given the difficulty in approach, maintenance of reduction and ability to instrument the fracture.   Truitt MerleKevin Carnisha Feltz, MD [facility]

## 2019-06-23 NOTE — Anesthesia Preprocedure Evaluation (Signed)
Anesthesia Evaluation  Patient identified by MRN, date of birth, ID band Patient awake    Reviewed: Allergy & Precautions, NPO status , Patient's Chart, lab work & pertinent test results  Airway Mallampati: II  TM Distance: >3 FB Neck ROM: Full    Dental no notable dental hx.    Pulmonary neg pulmonary ROS, former smoker,    Pulmonary exam normal breath sounds clear to auscultation       Cardiovascular hypertension, +CHF  Normal cardiovascular exam Rhythm:Regular Rate:Normal     Neuro/Psych PSYCHIATRIC DISORDERS Dementia negative neurological ROS     GI/Hepatic negative GI ROS, Neg liver ROS,   Endo/Other  diabetes  Renal/GU negative Renal ROS     Musculoskeletal negative musculoskeletal ROS (+)   Abdominal   Peds  Hematology negative hematology ROS (+)   Anesthesia Other Findings   Reproductive/Obstetrics negative OB ROS                             Anesthesia Physical Anesthesia Plan  ASA: III  Anesthesia Plan: General   Post-op Pain Management:    Induction: Intravenous  PONV Risk Score and Plan: 4 or greater and Ondansetron, Dexamethasone and Treatment may vary due to age or medical condition  Airway Management Planned: LMA and Oral ETT  Additional Equipment: None  Intra-op Plan:   Post-operative Plan: Extubation in OR  Informed Consent: I have reviewed the patients History and Physical, chart, labs and discussed the procedure including the risks, benefits and alternatives for the proposed anesthesia with the patient or authorized representative who has indicated his/her understanding and acceptance.   Patient has DNR.  Discussed DNR with power of attorney, Discussed DNR with patient and Suspend DNR.   Dental advisory given  Plan Discussed with: CRNA  Anesthesia Plan Comments:         Anesthesia Quick Evaluation

## 2019-06-23 NOTE — Transfer of Care (Signed)
Immediate Anesthesia Transfer of Care Note  Patient: Grace Zamora  Procedure(s) Performed: OPEN REDUCTION INTERNAL FIXATION (ORIF) PROXIMAL TIBIA FRACTURE (Right )  Patient Location: PACU  Anesthesia Type:General  Level of Consciousness: awake, alert  and confused  Airway & Oxygen Therapy: Patient Spontanous Breathing and Patient connected to nasal cannula oxygen  Post-op Assessment: Report given to RN and Post -op Vital signs reviewed and stable  Post vital signs: Reviewed and stable  Last Vitals:  Vitals Value Taken Time  BP 97/45 06/23/19 1303  Temp    Pulse 90 06/23/19 1305  Resp 17 06/23/19 1305  SpO2 98 % 06/23/19 1305  Vitals shown include unvalidated device data.  Last Pain:  Vitals:   06/23/19 0443  TempSrc: Oral  PainSc:          Complications: No apparent anesthesia complications

## 2019-06-23 NOTE — Interval H&P Note (Signed)
History and Physical Interval Note:  06/23/2019 10:32 AM  Grace Zamora  has presented today for surgery, with the diagnosis of Right proximal tibia fracture.  The various methods of treatment have been discussed with the patient and family. After consideration of risks, benefits and other options for treatment, the patient has consented to  Procedure(s): OPEN REDUCTION INTERNAL FIXATION (ORIF) PROXIMAL TIBIA FRACTURE (Right) as a surgical intervention.  The patient's history has been reviewed, patient examined, no change in status, stable for surgery.  I have reviewed the patient's chart and labs.  Questions were answered to the patient's satisfaction.     Lennette Bihari P Vincentina Sollers

## 2019-06-23 NOTE — Anesthesia Procedure Notes (Signed)
Procedure Name: Intubation Date/Time: 06/23/2019 11:18 AM Performed by: Trinna Post., CRNA Pre-anesthesia Checklist: Patient identified, Emergency Drugs available, Suction available, Patient being monitored and Timeout performed Patient Re-evaluated:Patient Re-evaluated prior to induction Oxygen Delivery Method: Circle system utilized Preoxygenation: Pre-oxygenation with 100% oxygen Induction Type: IV induction, Rapid sequence and Cricoid Pressure applied Laryngoscope Size: Mac and 3 Grade View: Grade I Tube type: Oral Tube size: 7.0 mm Number of attempts: 1 Airway Equipment and Method: Stylet Placement Confirmation: ETT inserted through vocal cords under direct vision,  positive ETCO2 and breath sounds checked- equal and bilateral Secured at: 23 cm Tube secured with: Tape Dental Injury: Teeth and Oropharynx as per pre-operative assessment

## 2019-06-23 NOTE — Progress Notes (Signed)
Pt's daughter informed of surgery today and verbal consent given for surgery. Pt's belongings put in belongings bag.

## 2019-06-24 ENCOUNTER — Encounter (HOSPITAL_COMMUNITY): Payer: Self-pay | Admitting: Student

## 2019-06-24 DIAGNOSIS — S82141A Displaced bicondylar fracture of right tibia, initial encounter for closed fracture: Secondary | ICD-10-CM

## 2019-06-24 LAB — CBC
HCT: 30.5 % — ABNORMAL LOW (ref 36.0–46.0)
Hemoglobin: 9.9 g/dL — ABNORMAL LOW (ref 12.0–15.0)
MCH: 30.6 pg (ref 26.0–34.0)
MCHC: 32.5 g/dL (ref 30.0–36.0)
MCV: 94.1 fL (ref 80.0–100.0)
Platelets: 205 10*3/uL (ref 150–400)
RBC: 3.24 MIL/uL — ABNORMAL LOW (ref 3.87–5.11)
RDW: 13.9 % (ref 11.5–15.5)
WBC: 11.3 10*3/uL — ABNORMAL HIGH (ref 4.0–10.5)
nRBC: 0 % (ref 0.0–0.2)

## 2019-06-24 LAB — BASIC METABOLIC PANEL
Anion gap: 12 (ref 5–15)
BUN: 33 mg/dL — ABNORMAL HIGH (ref 8–23)
CO2: 26 mmol/L (ref 22–32)
Calcium: 9.1 mg/dL (ref 8.9–10.3)
Chloride: 101 mmol/L (ref 98–111)
Creatinine, Ser: 1.36 mg/dL — ABNORMAL HIGH (ref 0.44–1.00)
GFR calc Af Amer: 42 mL/min — ABNORMAL LOW (ref >60)
GFR calc non Af Amer: 36 mL/min — ABNORMAL LOW (ref >60)
Glucose, Bld: 198 mg/dL — ABNORMAL HIGH (ref 70–99)
Potassium: 4.5 mmol/L (ref 3.5–5.1)
Sodium: 139 mmol/L (ref 135–145)

## 2019-06-24 LAB — GLUCOSE, CAPILLARY
Glucose-Capillary: 244 mg/dL — ABNORMAL HIGH (ref 70–99)
Glucose-Capillary: 184 mg/dL — ABNORMAL HIGH (ref 70–99)
Glucose-Capillary: 196 mg/dL — ABNORMAL HIGH (ref 70–99)
Glucose-Capillary: 177 mg/dL — ABNORMAL HIGH (ref 70–99)

## 2019-06-24 LAB — VITAMIN D 25 HYDROXY (VIT D DEFICIENCY, FRACTURES): Vit D, 25-Hydroxy: 66.3 ng/mL (ref 30.0–100.0)

## 2019-06-24 LAB — CALCITRIOL (1,25 DI-OH VIT D): Vit D, 1,25-Dihydroxy: 46.9 pg/mL (ref 19.9–79.3)

## 2019-06-24 MED ORDER — POLYETHYLENE GLYCOL 3350 17 G PO PACK
17.0000 g | PACK | Freq: Once | ORAL | Status: AC
  Administered 2019-06-24: 17 g via ORAL
  Filled 2019-06-24: qty 1

## 2019-06-24 MED ORDER — SENNOSIDES-DOCUSATE SODIUM 8.6-50 MG PO TABS
2.0000 | ORAL_TABLET | Freq: Two times a day (BID) | ORAL | Status: DC
  Administered 2019-06-24 – 2019-06-26 (×5): 2 via ORAL
  Filled 2019-06-24 (×5): qty 2

## 2019-06-24 MED ORDER — WARFARIN SODIUM 3 MG PO TABS
3.0000 mg | ORAL_TABLET | Freq: Once | ORAL | Status: AC
  Administered 2019-06-24: 3 mg via ORAL
  Filled 2019-06-24: qty 1

## 2019-06-24 MED ORDER — WARFARIN - PHARMACIST DOSING INPATIENT: Freq: Every day | Status: DC

## 2019-06-24 MED ORDER — SENNOSIDES-DOCUSATE SODIUM 8.6-50 MG PO TABS: 1.0000 | ORAL_TABLET | Freq: Every day | ORAL | Status: DC

## 2019-06-24 MED ORDER — HYDROCODONE-ACETAMINOPHEN 5-325 MG PO TABS: 1.0000 | ORAL_TABLET | Freq: Four times a day (QID) | ORAL | 0 refills | Status: DC | PRN

## 2019-06-24 NOTE — Progress Notes (Signed)
Patient Demographics:    Grace ReichertMary Zamora, is a 84 y.o. female, DOB - 12/24/1934, WUJ:[id number]  Admit date - 06/21/2019   Admitting Physician Hughie Clossavi Pahwani, MD  Outpatient Primary MD for the patient is Eloisa NorthernAmin, Saad, MD  LOS - 3   Chief Complaint  Patient presents with   Fall   Leg Injury        Subjective:    Grace Zamora today has no fevers, no emesis,  No chest pain, pleasantly confused in no acute distress  Assessment  & Plan :    Principal Problem:   Closed bicondylar fracture of right tibial plateau Active Problems:   Fall at home, initial encounter   Atrial fibrillation, chronic   Essential hypertension   Alzheimer's dementia (HCC)   Type 2 diabetes mellitus (HCC)  Brief Summary- Patient is an 84 year old female with history relevant for dementia, chronic atrial fibrillation on Coumadin, unknown type congestive heart failure, type 2 diabetes mellitus, hypertension did on 06/21/2019 with fall tib-fib fracture, underwent ORIF on 06/23/2019  A/p 1)Right Bicondylar Tibial Plateau Fracture -- Open reduction internal fixation of right tibial plateau fracture on 06/23/2019 by Dr. Jena GaussHaddix,  ---per ortho Weightbearing: WBAT RLE for transfers and  Insicional and dressing care per orthpedics               2)Acute blood loss anemia--hemoglobin down to 9.9 from 12.7 postop,, monitor closely and transfuse as needed, continue iron supplementation  3) chronic A. Fib--stable, continue Cardizem CD 180 mg daily, continue metoprolol 25 mg twice daily for rate control okay to restart Coumadin per orthopedic surgeon,   risk versus benefits of anticoagulation discussed with POA/daughter,, okay to restart Coumadin at this time, per daughter only 2 significant falls in the last couple of years PTA----  reconsider risk versus benefit of Coumadin if recurrent falls  4)HTN-stable, continue Cardizem and metoprolol   5)DM type  2--- continue Lantus insulin 12 units every evening, Use Novolog/Humalog Sliding scale insulin with Accu-Cheks/Fingersticks as ordered   6)Alzheimer's Disease--oriented x1, continue Aricept and Namenda, continue Remeron 7.5 mg nightly and trazodone 59 nightly and Zoloft 25 mg daily  7)Social/Ethics--- Discussed with daughter/POA--Grace Zamora --at [phone]  , patient is a DNR/DNI . daughter [facility] than SNF  Disposition/Need for in-Hospital Stay- patient unable to be discharged at this time due to awaiting SNF placement Vs ALF   Code Status : DNR  Family Communication:    Discussed with daughter/POA--Grace Zamora --at [phone]    Disposition Plan  : daughter prefers transfer back to [facility] rather than SNF Consults  :  ortho  DVT Prophylaxis  : coumadin- SCDs   Lab Results  Component Value Date   PLT 205 06/24/2019   Inpatient Medications  Scheduled Meds:  acetaminophen  650 mg Oral Q6H   diltiazem  180 mg Oral Daily   donepezil  10 mg Oral QHS   ferrous sulfate  325 mg Oral BID WC   fluticasone furoate-vilanterol  1 puff Inhalation Daily   furosemide  40 mg Oral BID   gabapentin  100 mg Oral TID   insulin aspart  0-15 Units Subcutaneous TID WC   insulin aspart  0-5 Units Subcutaneous QHS  insulin glargine  12 Units Subcutaneous QPM   memantine  14 mg Oral QPM   metoprolol tartrate  25 mg Oral BID   mirtazapine  7.5 mg Oral QHS   pantoprazole  40 mg Oral Daily   potassium chloride SA  20 mEq Oral BID   senna-docusate  2 tablet Oral BID   sertraline  25 mg Oral Daily   traZODone  50 mg Oral QHS   Continuous Infusions:  sodium chloride 20 mL/hr at 06/24/19 0851   lactated ringers 10 mL/hr at 06/23/19 1037   PRN Meds:.guaiFENesin, HYDROcodone-acetaminophen, HYDROmorphone (DILAUDID) injection, ipratropium-albuterol, liver oil-zinc oxide, ondansetron **OR** ondansetron (ZOFRAN)  IV   Anti-infectives (From admission, onward)   Start     Dose/Rate Route Frequency Ordered Stop   06/23/19 2000  ceFAZolin (ANCEF) IVPB 2g/100 mL premix     2 g 200 mL/hr over 30 Minutes Intravenous Every 8 hours 06/23/19 1347 06/24/19 1421   06/23/19 1158  vancomycin (VANCOCIN) powder  Status:  Discontinued       As needed 06/23/19 1159 06/23/19 1256        Objective:   Vitals:   06/24/19 0340 06/24/19 0716 06/24/19 0846 06/24/19 1438  BP: 112/66  137/88 134/76  Pulse: 88 92 79 80  Resp: 16 20 19 18   Temp: 98.2 F (36.8 C)  98.1 F (36.7 C) 98.4 F (36.9 C)  TempSrc: Oral  Oral Oral  SpO2: 100% 100% 94% 96%  Weight:      Height:        Wt Readings from Last 3 Encounters:  06/23/19 80.8 kg  04/25/19 77.1 kg     Intake/Output Summary (Last 24 hours) at 06/24/2019 1557 Last data filed at 06/24/2019 1522 Gross per 24 hour  Intake 3360.59 ml  Output 1300 ml  Net 2060.59 ml   Physical Exam  Gen:- Awake Alert, pleasantly confused only oriented to person HEENT:- [place].AT, No sclera icterus Neck-Supple Neck,No JVD,.  Lungs-  CTAB , fair symmetrical air movement CV- S1, S2 normal, irregular  Abd-  +ve B.Sounds, Abd Soft, No tenderness,    Extremity/Skin:-Right lower extremity with dressing/splint on,  cap refill noted.,  pedal pulses present  Psych-affect is appropriate, oriented x 1 Neuro-generalized weakness, unsteady gait , no new focal deficits, no tremors   Data Review:   Micro Results Recent Results (from the past 240 hour(s))  SARS Coronavirus 2 (CEPHEID - Performed in [facility] hospital lab), Hosp Order     Status: None   Collection Time: 06/21/19  6:01 AM   Specimen: Nasopharyngeal Swab  Result Value Ref Range Status   SARS Coronavirus 2 NEGATIVE NEGATIVE Final    Comment: (NOTE) If result is NEGATIVE SARS-CoV-2 target nucleic acids are NOT DETECTED. The SARS-CoV-2 RNA is generally detectable in upper and lower  respiratory specimens during the acute  phase of infection. The lowest  concentration of SARS-CoV-2 viral copies this assay can detect is 250  copies / mL. A negative result does not preclude SARS-CoV-2 infection  and should not be used as the sole basis for treatment or other  patient management decisions.  A negative result may occur with  improper specimen collection / handling, submission of specimen other  than nasopharyngeal swab, presence of viral mutation(s) within the  areas targeted by this assay, and inadequate number of viral copies  (<250 copies / mL). A negative result must be combined with clinical  observations, patient history, and epidemiological information. If result is POSITIVE SARS-CoV-2  target nucleic acids are DETECTED. The SARS-CoV-2 RNA is generally detectable in upper and lower  respiratory specimens dur ing the acute phase of infection.  Positive  results are indicative of active infection with SARS-CoV-2.  Clinical  correlation with patient history and other diagnostic information is  necessary to determine patient infection status.  Positive results do  not rule out bacterial infection or co-infection with other viruses. If result is PRESUMPTIVE POSTIVE SARS-CoV-2 nucleic acids MAY BE PRESENT.   A presumptive positive result was obtained on the submitted specimen  and confirmed on repeat testing.  While 2019 novel coronavirus  (SARS-CoV-2) nucleic acids may be present in the submitted sample  additional confirmatory testing may be necessary for epidemiological  and / or clinical management purposes  to differentiate between  SARS-CoV-2 and other Sarbecovirus currently known to infect humans.  If clinically indicated additional testing with an alternate test  methodology [phone]) is advised. The SARS-CoV-2 RNA is generally  detectable in upper and lower respiratory sp ecimens during the acute  phase of infection. The expected result is Negative. Fact Sheet for Patients:   BoilerBrush.com.cy Fact Sheet for Healthcare Providers: https://pope.com/ This test is not yet approved or cleared by the [place] FDA and has been authorized for detection and/or diagnosis of SARS-CoV-2 by FDA under an Emergency Use Authorization (EUA).  This EUA will remain in effect (meaning this test can be used) for the duration of the COVID-19 declaration under Section 564(b)(1) of the Act, 21 U.S.C. section 360bbb-3(b)(1), unless the authorization is terminated or revoked sooner. Performed at [facility], [address], [place], [place] [address]   Surgical pcr screen     Status: None   Collection Time: 06/21/19 11:18 AM   Specimen: Nasal Mucosa; Nasal Swab  Result Value Ref Range Status   MRSA, PCR NEGATIVE NEGATIVE Final   Staphylococcus aureus NEGATIVE NEGATIVE Final    Comment: (NOTE) The Xpert SA Assay (FDA approved for NASAL specimens in patients 35 years of age and older), is one component of a comprehensive surveillance program. It is not intended to diagnose infection nor to guide or monitor treatment. Performed at [facility], [address], [place], [place] [address]     Radiology Reports Ct Head Wo Contrast  Result Date: 06/21/2019 CLINICAL DATA:  Fall. EXAM: CT HEAD WITHOUT CONTRAST CT CERVICAL SPINE WITHOUT CONTRAST TECHNIQUE: Multidetector CT imaging of the head and cervical spine was performed following the standard protocol without intravenous contrast. Multiplanar CT image reconstructions of the cervical spine were also generated. COMPARISON:  CT scan of Apr 25, 2019. FINDINGS: CT HEAD FINDINGS Brain: Mild diffuse cortical atrophy is noted. Mild chronic ischemic white matter disease is noted. No mass effect or midline shift is noted. Ventricular size is within normal limits. There is no evidence of mass lesion, hemorrhage or acute infarction. Vascular: No hyperdense vessel or  unexpected calcification. Skull: Normal. Negative for fracture or focal lesion. Sinuses/Orbits: No acute finding. Other: None. CT CERVICAL SPINE FINDINGS Alignment: Stable grade 1 anterolisthesis of C4-5 is noted secondary to posterior facet joint hypertrophy. Pole C7 fracture is noted. No acute fracture is noted. Skull base and vertebrae: No acute fracture. No primary bone lesion or focal pathologic process. Soft tissues and spinal canal: No prevertebral fluid or swelling. No visible canal hematoma. Disc levels:  Severe degenerative disc disease is noted at C5-6. Upper chest: Negative. Other: Degenerative changes are seen involving posterior facet joints bilaterally. IMPRESSION: Mild diffuse cortical  atrophy. Mild chronic ischemic white matter disease. No acute intracranial abnormality seen. Stable degenerative changes are noted in cervical spine as described above. No acute abnormality is noted. Electronically Signed   By: Lupita Raider M.D.   On: 06/21/2019 07:39   Ct Cervical Spine Wo Contrast  Result Date: 06/21/2019 CLINICAL DATA:  Fall. EXAM: CT HEAD WITHOUT CONTRAST CT CERVICAL SPINE WITHOUT CONTRAST TECHNIQUE: Multidetector CT imaging of the head and cervical spine was performed following the standard protocol without intravenous contrast. Multiplanar CT image reconstructions of the cervical spine were also generated. COMPARISON:  CT scan of Apr 25, 2019. FINDINGS: CT HEAD FINDINGS Brain: Mild diffuse cortical atrophy is noted. Mild chronic ischemic white matter disease is noted. No mass effect or midline shift is noted. Ventricular size is within normal limits. There is no evidence of mass lesion, hemorrhage or acute infarction. Vascular: No hyperdense vessel or unexpected calcification. Skull: Normal. Negative for fracture or focal lesion. Sinuses/Orbits: No acute finding. Other: None. CT CERVICAL SPINE FINDINGS Alignment: Stable grade 1 anterolisthesis of C4-5 is noted secondary to posterior facet  joint hypertrophy. Pole C7 fracture is noted. No acute fracture is noted. Skull base and vertebrae: No acute fracture. No primary bone lesion or focal pathologic process. Soft tissues and spinal canal: No prevertebral fluid or swelling. No visible canal hematoma. Disc levels:  Severe degenerative disc disease is noted at C5-6. Upper chest: Negative. Other: Degenerative changes are seen involving posterior facet joints bilaterally. IMPRESSION: Mild diffuse cortical atrophy. Mild chronic ischemic white matter disease. No acute intracranial abnormality seen. Stable degenerative changes are noted in cervical spine as described above. No acute abnormality is noted. Electronically Signed   By: Lupita Raider M.D.   On: 06/21/2019 07:39   Ct Knee Right Wo Contrast  Result Date: 06/21/2019 CLINICAL DATA:  Evaluate complex tibia fracture. EXAM: CT OF THE right KNEE WITHOUT CONTRAST TECHNIQUE: Multidetector CT imaging of the technique right knee was performed according to the standard protocol. Multiplanar CT image reconstructions were also generated. COMPARISON:  Radiographs same date. FINDINGS: There is an inverted T-shaped proximal tibia fracture with a long longitudinal fracture extending between the tibial spines down to the transverse component of the fracture at the metadiaphyseal junction. There is 1 cm of lateral displacement and 9 mm of posterior displacement of the transverse fracture. Associated comminuted fracture of the fibular head and neck. No fracture of the femur or patella. There is a large joint effusion/lipohemarthrosis. There is also extensive subcutaneous soft tissue swelling/edema/hemorrhage in the anterior soft tissues. Grossly the cruciate and collateral ligaments are intact and the quadriceps and patellar tendons are intact. Moderate to advanced vascular calcifications noted. IMPRESSION: 1. Inverted T shaped fracture of the proximal tibia as detailed above. 2. Comminuted fracture of the fibular  head and neck. 3. Large lipohemarthrosis. 4. Grossly intact knee ligaments. Electronically Signed   By: Rudie Meyer M.D.   On: 06/21/2019 08:00   Dg Chest Port 1 View  Result Date: 06/21/2019 CLINICAL DATA:  Preoperative tibia fracture repair EXAM: PORTABLE CHEST 1 VIEW COMPARISON:  04/25/2019 chest radiograph. FINDINGS: Stable cardiomediastinal silhouette with normal heart size. No pneumothorax. No pleural effusion. Lungs appear clear, with no acute consolidative airspace disease and no pulmonary edema. IMPRESSION: No active disease. Electronically Signed   By: Delbert Phenix M.D.   On: 06/21/2019 08:19   Dg Knee Complete 4 Views Right  Result Date: 06/23/2019 CLINICAL DATA:  Closed reduction and internal fixation of proximal tibial  fractures. EXAM: RIGHT KNEE - COMPLETE 4+ VIEW; DG C-ARM 61-120 MIN COMPARISON:  None. FINDINGS: Fluoroscopic spot images demonstrate reduction of the proximal tibia fractures with placement of a lateral sideplate and screws. Good position and alignment without complicating features. IMPRESSION: Open reduction and internal fixation of a complex proximal tibia fracture. Hardware in good position without complicating features. Electronically Signed   By: Marijo Sanes M.D.   On: 06/23/2019 14:34   Dg Knee Complete 4 Views Right  Result Date: 06/21/2019 CLINICAL DATA:  Fall. EXAM: RIGHT KNEE - COMPLETE 4+ VIEW COMPARISON:  None. FINDINGS: Tibial metaphysis fracture with dorsal impaction. No visible extension to the joint space. There is soft tissue swelling and joint effusion. Fibular neck is likely also fractured. Prominent osteopenia. IMPRESSION: 1. Angulated tibial metaphysis fracture. 2. Fibular neck fracture. Electronically Signed   By: Monte Fantasia M.D.   On: 06/21/2019 05:28   Dg Tibia/fibula Right Port  Result Date: 06/23/2019 CLINICAL DATA:  Proximal tibial fracture EXAM: PORTABLE RIGHT TIBIA AND FIBULA - 2 VIEW COMPARISON:  Intraoperative films from earlier in  the same day. FINDINGS: Lateral fixation sideplate is noted along the proximal tibia with multiple fixation screws. Fracture fragments are in near anatomic alignment. Proximal fibular fracture is again noted. IMPRESSION: Status post ORIF of proximal tibial fracture. Electronically Signed   By: Inez Catalina M.D.   On: 06/23/2019 15:42   Dg C-arm 1-60 Min  Result Date: 06/23/2019 CLINICAL DATA:  Closed reduction and internal fixation of proximal tibial fractures. EXAM: RIGHT KNEE - COMPLETE 4+ VIEW; DG C-ARM 61-120 MIN COMPARISON:  None. FINDINGS: Fluoroscopic spot images demonstrate reduction of the proximal tibia fractures with placement of a lateral sideplate and screws. Good position and alignment without complicating features. IMPRESSION: Open reduction and internal fixation of a complex proximal tibia fracture. Hardware in good position without complicating features. Electronically Signed   By: Marijo Sanes M.D.   On: 06/23/2019 14:34   Dg Hips Bilat W Or Wo Pelvis 3-4 Views  Result Date: 06/21/2019 CLINICAL DATA:  Fall. EXAM: DG HIP (WITH OR WITHOUT PELVIS) 3-4V BILAT COMPARISON:  None. FINDINGS: Limited under penetrated study with rotation on the pelvis view. No evidence of hip fracture or dislocation. There is a bipolar left hip hemiarthroplasty. IMPRESSION: No acute finding. Electronically Signed   By: Monte Fantasia M.D.   On: 06/21/2019 05:29     CBC Recent Labs  Lab 06/21/19 0601 06/22/19 0422 06/23/19 0016 06/24/19 0710  WBC 12.2* 9.6 10.8* 11.3*  HGB 12.7 11.4* 10.8* 9.9*  HCT 39.6 34.5* 32.0* 30.5*  PLT 214 210 205 205  MCV 94.3 91.8 90.7 94.1  MCH 30.2 30.3 30.6 30.6  MCHC 32.1 33.0 33.8 32.5  RDW 14.3 14.2 13.7 13.9  LYMPHSABS 0.7  --   --   --   MONOABS 0.6  --   --   --   EOSABS 0.1  --   --   --   BASOSABS 0.0  --   --   --     Chemistries  Recent Labs  Lab 06/21/19 0601 06/22/19 0422 06/23/19 0016 06/24/19 0655  NA 140 137 135 139  K 4.1 3.9 3.2* 4.5  CL  106 101 98 101  CO2 23 24 25 26   GLUCOSE 193* 171* 176* 198*  BUN 25* 17 19 33*  CREATININE 0.99 1.14* 1.27* 1.36*  CALCIUM 9.2 9.3 8.7* 9.1  AST  --  18 16  --   ALT  --  14 13  --   ALKPHOS  --  84 77  --   BILITOT  --  1.0 1.0  --    ------------------------------------------------------------------------------------------------------------------ No results for input(s): CHOL, HDL, LDLCALC, TRIG, CHOLHDL, LDLDIRECT in the last 72 hours.  No results found for: HGBA1C ------------------------------------------------------------------------------------------------------------------ No results for input(s): TSH, T4TOTAL, T3FREE, THYROIDAB in the last 72 hours.  Invalid input(s): FREET3 ------------------------------------------------------------------------------------------------------------------ No results for input(s): VITAMINB12, FOLATE, FERRITIN, TIBC, IRON, RETICCTPCT in the last 72 hours.  Coagulation profile Recent Labs  Lab 06/21/19 0601 06/21/19 1402 06/22/19 0422  INR 1.9* 2.0* 2.0*    No results for input(s): DDIMER in the last 72 hours.  Cardiac Enzymes No results for input(s): CKMB, TROPONINI, MYOGLOBIN in the last 168 hours.  Invalid input(s): CK ------------------------------------------------------------------------------------------------------------------ No results found for: BNP   Shon Haleourage Rebel Willcutt M.D on 06/24/2019 at 3:57 PM  Go to [email or web address] - for contact info  [facility] - Office  [phone]

## 2019-06-24 NOTE — Progress Notes (Signed)
ANTICOAGULATION CONSULT NOTE - Initial Consult  Pharmacy Consult for warfarin Indication: atrial fibrillation  Allergies  Allergen Reactions  . Lisinopril   . Niacin And Related   . Sulfa Antibiotics     Patient Measurements: Height: 5' (152.4 cm) Weight: 178 lb 2.1 oz (80.8 kg) IBW/kg (Calculated) : 45.5  Vital Signs: Temp: 98.4 F (36.9 C) (07/02 1438) Temp Source: Oral (07/02 1438) BP: 134/76 (07/02 1438) Pulse Rate: 80 (07/02 1438)  Labs: Recent Labs    06/22/19 0422 06/23/19 0016 06/24/19 0655 06/24/19 0710  HGB 11.4* 10.8*  --  9.9*  HCT 34.5* 32.0*  --  30.5*  PLT 210 205  --  205  LABPROT 22.7*  --   --   --   INR 2.0*  --   --   --   CREATININE 1.14* 1.27* 1.36*  --     Estimated Creatinine Clearance: 30 mL/min (A) (by C-G formula based on SCr of 1.36 mg/dL (H)).  Assessment: CC/HPI: 84 yo f presenting with tibial fx - now post op  PMH: Dementia, Afib on warfarin, CHF, DM2, HTN  Anticoag: warfarin pta. INR 6/30 2  PTA warfarin 2 mg Mon, 3 mg AOD - last dose 6/28  Renal: SCr 1.36   Heme/Onc: H&H 9.9/30.5, Plt 205  Goal of Therapy:  INR 2-3 Monitor platelets by anticoagulation protocol: Yes   Plan:  Warfarin 3 mg x 1 Daily INR  Levester Fresh, PharmD, BCPS, BCCCP Clinical Pharmacist [phone]  Please check AMION for all [place] numbers  06/24/2019 6:10 PM

## 2019-06-24 NOTE — Evaluation (Signed)
Physical Therapy Evaluation Patient Details Name: Grace Zamora MRN: [id number] DOB: 1934-12-24 Today's Date: 06/24/2019   History of Present Illness  Patient is a 84 y/o female s/p Open reduction internal fixation of right tibial plateau fracture on 06/23/2019. Past medical history of dementia, chronic atrial fibrillation on Coumadin, unknown type congestive heart failure, type 2 diabetes mellitus, hypertension.    Clinical Impression  Patient admitted with the above listed diagnosis. Per chart review, patient a resident of ALF where she was ambulatory with staff. Patient today requiring Mod/Max A+2 for all mobility. Able to follow simple commands in session. With attempts to stand patient unable to clear buttocks from bedside even with physical assist - knee pain a component of this. Patient returned to supine with nursing with all needs met. Will recommend SNF at discharge to progress functional mobility. PT to follow acutely.      Follow Up Recommendations SNF;Supervision/Assistance - 24 hour    Equipment Recommendations  Other (comment)(TBD)    Recommendations for Other Services       Precautions / Restrictions Precautions Precautions: Fall Restrictions Weight Bearing Restrictions: Yes RLE Weight Bearing: Weight bearing as tolerated Other Position/Activity Restrictions: for transfers      Mobility  Bed Mobility Overal bed mobility: Needs Assistance Bed Mobility: Supine to Sit;Sit to Supine     Supine to sit: Mod assist Sit to supine: Max assist;+2 for physical assistance   General bed mobility comments: Mod A with heavy cueing to come to EOB; Max A +2 for return to supine  Transfers Overall transfer level: Needs assistance   Transfers: Sit to/from Stand Sit to Stand: Max assist;+2 physical assistance         General transfer comment: Attempted sit to stand x 4 trials with RN and Charlaine Dalton; patient unable to clear buttocks from bedside enough to place seat of Stedy down -  patient easily frustrated with pain  Ambulation/Gait                Stairs            Wheelchair Mobility    Modified Rankin (Stroke Patients Only)       Balance Overall balance assessment: Needs assistance Sitting-balance support: Bilateral upper extremity supported;Feet supported Sitting balance-Leahy Scale: Fair       Standing balance-Leahy Scale: Zero                               Pertinent Vitals/Pain Pain Assessment: Faces Faces Pain Scale: Hurts even more Pain Location: R knee with mobility Pain Descriptors / Indicators: Aching;Grimacing;Moaning Pain Intervention(s): Limited activity within patient's tolerance;Monitored during session;Repositioned    Home Living Family/patient expects to be discharged to:: Skilled nursing facility                      Prior Function           Comments: unsure - patient is a poor historian     Hand Dominance        Extremity/Trunk Assessment   Upper Extremity Assessment Upper Extremity Assessment: Generalized weakness    Lower Extremity Assessment Lower Extremity Assessment: Generalized weakness;RLE deficits/detail RLE Deficits / Details: R LE in ACE bandage; flexed to ~30 degrees in supine RLE: Unable to fully assess due to pain    Cervical / Trunk Assessment Cervical / Trunk Assessment: Kyphotic  Communication   Communication: No difficulties  Cognition Arousal/Alertness: Awake/alert Behavior During Therapy:  WFL for tasks assessed/performed Overall Cognitive Status: History of cognitive impairments - at baseline                                        General Comments General comments (skin integrity, edema, etc.): confused throughout session    Exercises     Assessment/Plan    PT Assessment Patient needs continued PT services  PT Problem List Decreased strength;Decreased activity tolerance;Decreased balance;Decreased mobility;Decreased knowledge of  use of DME;Decreased safety awareness       PT Treatment Interventions DME instruction;Gait training;Functional mobility training;Therapeutic activities;Therapeutic exercise;Balance training;Patient/family education    PT Goals (Current goals can be found in the Care Plan section)  Acute Rehab PT Goals Patient Stated Goal: none stated PT Goal Formulation: With patient Time For Goal Achievement: 07/08/19 Potential to Achieve Goals: Fair    Frequency Min 2X/week   Barriers to discharge        Co-evaluation               AM-PAC PT "6 Clicks" Mobility  Outcome Measure Help needed turning from your back to your side while in a flat bed without using bedrails?: A Lot Help needed moving from lying on your back to sitting on the side of a flat bed without using bedrails?: A Lot Help needed moving to and from a bed to a chair (including a wheelchair)?: Total Help needed standing up from a chair using your arms (e.g., wheelchair or bedside chair)?: Total Help needed to walk in hospital room?: Total Help needed climbing 3-5 steps with a railing? : Total 6 Click Score: 8    End of Session Equipment Utilized During Treatment: Gait belt Activity Tolerance: Patient tolerated treatment well Patient left: in bed;with call bell/phone within reach;with bed alarm set;with nursing/sitter in room Nurse Communication: Mobility status PT Visit Diagnosis: Unsteadiness on feet (R26.81);Other abnormalities of gait and mobility (R26.89);Muscle weakness (generalized) (M62.81)    Time: 8466-5993 PT Time Calculation (min) (ACUTE ONLY): 27 min   Charges:   PT Evaluation $PT Eval Moderate Complexity: 1 Mod PT Treatments $Therapeutic Activity: 8-22 mins         Lanney Gins, PT, DPT Supplemental Physical Therapist 06/24/19 3:23 PM Pager: [phone] Office: [phone]

## 2019-06-24 NOTE — TOC Initial Note (Signed)
Transition of Care [facility]) - Initial/Assessment Note    Patient Details  Name: Grace Zamora MRN: [id number] Date of Birth: 04/30/1935  Transition of Care [facility]) CM/SW Contact:    Alberteen Sam, [place] Phone Number: [phone] 06/24/2019, 4:11 PM  Clinical Narrative:                  CSW consulted with patient's daughter Horris Latino regarding discharge planning and PT recommendation of [place] at discharge. Horris Latino reports she would like patient to return to [facility] in their [place] [facility] as she is worried about patient going to a SNF as she hears many news stories about COVID outbreaks being at [facility]. She also reports due to patient's dementia that she would like patient to go back to her familiar home, as transitions to new places are difficult to her. CSW informed Horris Latino that CSW can reach out to [facility] to see if they can accept patient back given her current state.   CSW reached out to [facility], they are requesting CSW fax clinicals to them for review if they can accept patient back. They do not know their fax number....Marland KitchenCSW awaiting call back with fax number in order to fax clinicals to them.   Expected Discharge Plan: Assisted Living Barriers to Discharge: Continued Medical Work up   Patient Goals and CMS Choice   CMS Medicare.gov Compare Post Acute Care list provided to:: Patient Represenative (must comment)(Bonnie (daughter)) Choice offered to / list presented to : Adult Children(Bonnie (daughter))  Expected Discharge Plan and Services Expected Discharge Plan: Assisted Living     Post Acute Care Choice: (Assisted Living) Living arrangements for the past 2 months: Assisted Living Facility([facility] memory care)                                      Prior Living Arrangements/Services Living arrangements for the past 2 months: Assisted Living Facility([facility] memory care) Lives with:: Self Patient language and need for  interpreter reviewed:: Yes        Need for Family Participation in Patient Care: Yes (Comment) Care giver support system in place?: Yes (comment)   Criminal Activity/Legal Involvement Pertinent to Current Situation/Hospitalization: No - Comment as needed  Activities of Daily Living      Permission Sought/Granted Permission sought to share information with : Case Manager, Customer service manager, Family Supports Permission granted to share information with : Yes, Verbal Permission Granted  Share Information with NAME: Horris Latino  Permission granted to share info w AGENCY: SNFs  Permission granted to share info w Relationship: daughter  Permission granted to share info w Contact Information: [phone]  Emotional Assessment Appearance:: Other (Comment Required(unable to assess) Attitude/Demeanor/Rapport: Unable to Assess Affect (typically observed): Unable to Assess Orientation: : Oriented to Self Alcohol / Substance Use: Not Applicable Psych Involvement: No (comment)  Admission diagnosis:  Closed fracture of proximal end of right tibia, unspecified fracture morphology, initial encounter [S82.101A] Closed fracture of both fibulae, initial encounter [S82.401A, S82.402A] Patient Active Problem List   Diagnosis Date Noted  . Closed bicondylar fracture of right tibial plateau 06/24/2019  . Fall at home, initial encounter 06/21/2019  . Atrial fibrillation, chronic 06/21/2019  . Essential hypertension 06/21/2019  . Alzheimer's dementia ([place]) 06/21/2019  . Type 2 diabetes mellitus ([place]) 06/21/2019   PCP:  Garwin Brothers, MD Pharmacy:  No Pharmacies Listed    Social Determinants  of Health (SDOH) Interventions    Readmission Risk Interventions No flowsheet data found.  

## 2019-06-24 NOTE — Progress Notes (Signed)
Orthopaedic Trauma Progress Note  S: Patient in a lot of pain this morning in her leg. Hard to gauge pain on exam, as she states her left leg is extremely tender with palpation as well.  Is requesting to be given something stronger than Tylenol.   O:  Vitals:   06/24/19 0013 06/24/19 0340  BP: 102/66 112/66  Pulse: 92 88  Resp: 14 16  Temp: 97.9 F (36.6 C) 98.2 F (36.8 C)  SpO2: 100% 100%    General - Sitting up in bed, NAD. Pleasant  Right Lower Extremity - Dressing in place, is clean, dry, intact. Extremely tender with palpation of leg from thigh down to toes. Able to get some active plantarflexion, tolerates passive dorsiflexion to neutral. Sensation intact distally. Wiggles toes some. +DP pulse  Imaging: Stable post op imaging.   Labs:  Results for orders placed or performed during the hospital encounter of 06/21/19 (from the past 24 hour(s))  Glucose, capillary     Status: Abnormal   Collection Time: 06/23/19  8:05 AM  Result Value Ref Range   Glucose-Capillary 158 (H) 70 - 99 mg/dL  Glucose, capillary     Status: Abnormal   Collection Time: 06/23/19  5:03 PM  Result Value Ref Range   Glucose-Capillary 196 (H) 70 - 99 mg/dL  Glucose, capillary     Status: Abnormal   Collection Time: 06/23/19  8:47 PM  Result Value Ref Range   Glucose-Capillary 299 (H) 70 - 99 mg/dL    Assessment: 84 year old female s/p fall  Injuries: Right bicondylar tibial plateau fracture s/p ORIF 06/23/19  Weightbearing: WBAT RLE for transfers  Insicional and dressing care: Plan to change dressing tomorrow  Orthopedic device(s): None  CV/Blood loss: Hgb 10.8 pre-operatively, CBC pending this AM. hemodynamically stable  Pain management:  1. Tylenol 650 mg q 6 hours scheduled 2. Norco 5/325 q 4 hours PRN 3. Neurontin 100 mg TID 4. Dilaudid 0.5-1 mg q 3 hours PRN  VTE prophylaxis: Okay to restart coumadin today from ortho perspective  ID: Ancef 2gm post op  Foley/Lines: Foley in place,  plan to remove this AM.  KVO IVFs  Medical co-morbidities: A. Fib, HTN, DM type 2, Alzheimer's disease  Impediments to Fracture Healing: DM type 2  Dispo: PT eval today, may need SNF.  Okay for discharge from ortho standpoint once cleared by medicine team and therapies  Follow - up plan: 2 weeks    Deavon Podgorski A. Carmie [facility] ?([phone] (phone)

## 2019-06-24 NOTE — Discharge Instructions (Signed)
Orthopaedic Trauma Service Discharge Instructions   General Discharge Instructions  WEIGHT BEARING STATUS: Weightbearing as tolerated on right leg for transfers  RANGE OF MOTION/ACTIVITY: Full unrestricted range of motion of right knee as tolerated  Wound Care: Incisions can be left open to air if there is no drainage. If incision continues to have drainage, follow wound care instructions below. Okay to shower if no drainage from incisions.  DVT/PE prophylaxis: Resume home dose of Coumadin  Diet: as you were eating previously.  Can use over the counter stool softeners and bowel preparations, such as Miralax, to help with bowel movements.  Narcotics can be constipating.  Be sure to drink plenty of fluids  PAIN MEDICATION USE AND EXPECTATIONS  You have likely been given narcotic medications to help control your pain.  After a traumatic event that results in an fracture (broken bone) with or without surgery, it is ok to use narcotic pain medications to help control one's pain.  We understand that everyone responds to pain differently and each individual patient will be evaluated on a regular basis for the continued need for narcotic medications. Ideally, narcotic medication use should last no more than 6-8 weeks (coinciding with fracture healing).   As a patient it is your responsibility as well to monitor narcotic medication use and report the amount and frequency you use these medications when you come to your office visit.   We would also advise that if you are using narcotic medications, you should take a dose prior to therapy to maximize you participation.  IF YOU ARE ON NARCOTIC MEDICATIONS IT IS NOT PERMISSIBLE TO OPERATE A MOTOR VEHICLE (MOTORCYCLE/CAR/TRUCK/MOPED) OR HEAVY MACHINERY DO NOT MIX NARCOTICS WITH OTHER CNS (CENTRAL NERVOUS SYSTEM) DEPRESSANTS SUCH AS ALCOHOL   STOP SMOKING OR USING NICOTINE PRODUCTS!!!!  As discussed nicotine severely impairs your body's ability to heal  surgical and traumatic wounds but also impairs bone healing.  Wounds and bone heal by forming microscopic blood vessels (angiogenesis) and nicotine is a vasoconstrictor (essentially, shrinks blood vessels).  Therefore, if vasoconstriction occurs to these microscopic blood vessels they essentially disappear and are unable to deliver necessary nutrients to the healing tissue.  This is one modifiable factor that you can do to dramatically increase your chances of healing your injury.    (This means no smoking, no nicotine gum, patches, etc)  DO NOT USE NONSTEROIDAL ANTI-INFLAMMATORY DRUGS (NSAID'S)  Using products such as Advil (ibuprofen), Aleve (naproxen), Motrin (ibuprofen) for additional pain control during fracture healing can delay and/or prevent the healing response.  If you would like to take over the counter (OTC) medication, Tylenol (acetaminophen) is ok.  However, some narcotic medications that are given for pain control contain acetaminophen as well. Therefore, you should not exceed more than 4000 mg of tylenol in a day if you do not have liver disease.  Also note that there are may OTC medicines, such as cold medicines and allergy medicines that my contain tylenol as well.  If you have any questions about medications and/or interactions please ask your doctor/PA or your pharmacist.      ICE AND ELEVATE INJURED/OPERATIVE EXTREMITY  Using ice and elevating the injured extremity above your heart can help with swelling and pain control.  Icing in a pulsatile fashion, such as 20 minutes on and 20 minutes off, can be followed.    Do not place ice directly on skin. Make sure there is a barrier between to skin and the ice pack.  Using frozen items such as frozen peas works well as the conform nicely to the are that needs to be iced.  USE AN ACE WRAP OR TED HOSE FOR SWELLING CONTROL  In addition to icing and elevation, Ace wraps or TED hose are used to help limit and resolve swelling.  It is  recommended to use Ace wraps or TED hose until you are informed to stop.    When using Ace Wraps start the wrapping distally (farthest away from the body) and wrap proximally (closer to the body)   Example: If you had surgery on your leg or thing and you do not have a splint on, start the ace wrap at the toes and work your way up to the thigh        If you had surgery on your upper extremity and do not have a splint on, start the ace wrap at your fingers and work your way up to the upper arm   CALL THE OFFICE WITH ANY QUESTIONS OR CONCERNS: [phone]   VISIT OUR WEBSITE FOR ADDITIONAL INFORMATION: [email or web address]     Discharge Wound Care Instructions  Do NOT apply any ointments, solutions or lotions to pin sites or surgical wounds.  These prevent needed drainage and even though solutions like hydrogen peroxide kill bacteria, they also damage cells lining the pin sites that help fight infection.  Applying lotions or ointments can keep the wounds moist and can cause them to breakdown and open up as well. This can increase the risk for infection. When in doubt call the office.  Surgical incisions should be dressed daily.  If any drainage is noted, use one layer of adaptic, then gauze, Kerlix, and an ace wrap.  Once the incision is completely dry and without drainage, it may be left open to air out.  Showering may begin 36-48 hours later.  Cleaning gently with soap and water.  Traumatic wounds should be dressed daily as well.    One layer of adaptic, gauze, Kerlix, then ace wrap.  The adaptic can be discontinued once the draining has ceased    If you have a wet to dry dressing: wet the gauze with saline the squeeze as much saline out so the gauze is moist (not soaking wet), place moistened gauze over wound, then place a dry gauze over the moist one, followed by Kerlix wrap, then ace wrap.

## 2019-06-24 NOTE — Progress Notes (Signed)
RN spoke with pt's daughter, Horris Latino, updated her on pt plan of care. All questions answered.

## 2019-06-25 LAB — GLUCOSE, CAPILLARY
Glucose-Capillary: 140 mg/dL — ABNORMAL HIGH (ref 70–99)
Glucose-Capillary: 135 mg/dL — ABNORMAL HIGH (ref 70–99)
Glucose-Capillary: 121 mg/dL — ABNORMAL HIGH (ref 70–99)
Glucose-Capillary: 145 mg/dL — ABNORMAL HIGH (ref 70–99)

## 2019-06-25 LAB — CBC
HCT: 31.2 % — ABNORMAL LOW (ref 36.0–46.0)
Hemoglobin: 10.1 g/dL — ABNORMAL LOW (ref 12.0–15.0)
MCH: 30.7 pg (ref 26.0–34.0)
MCHC: 32.4 g/dL (ref 30.0–36.0)
MCV: 94.8 fL (ref 80.0–100.0)
Platelets: 217 10*3/uL (ref 150–400)
RBC: 3.29 MIL/uL — ABNORMAL LOW (ref 3.87–5.11)
RDW: 14.1 % (ref 11.5–15.5)
WBC: 10.7 10*3/uL — ABNORMAL HIGH (ref 4.0–10.5)
nRBC: 0.4 % — ABNORMAL HIGH (ref 0.0–0.2)

## 2019-06-25 LAB — BASIC METABOLIC PANEL
Anion gap: 13 (ref 5–15)
BUN: 46 mg/dL — ABNORMAL HIGH (ref 8–23)
CO2: 21 mmol/L — ABNORMAL LOW (ref 22–32)
Calcium: 8.9 mg/dL (ref 8.9–10.3)
Chloride: 105 mmol/L (ref 98–111)
Creatinine, Ser: 1.41 mg/dL — ABNORMAL HIGH (ref 0.44–1.00)
GFR calc Af Amer: 40 mL/min — ABNORMAL LOW (ref >60)
GFR calc non Af Amer: 35 mL/min — ABNORMAL LOW (ref >60)
Glucose, Bld: 155 mg/dL — ABNORMAL HIGH (ref 70–99)
Potassium: 4.5 mmol/L (ref 3.5–5.1)
Sodium: 139 mmol/L (ref 135–145)

## 2019-06-25 LAB — PROTIME-INR
INR: 1.9 — ABNORMAL HIGH (ref 0.8–1.2)
Prothrombin Time: 21.7 seconds — ABNORMAL HIGH (ref 11.4–15.2)

## 2019-06-25 MED ORDER — WARFARIN SODIUM 4 MG PO TABS
4.0000 mg | ORAL_TABLET | Freq: Once | ORAL | Status: AC
  Administered 2019-06-25: 4 mg via ORAL
  Filled 2019-06-25: qty 1

## 2019-06-25 NOTE — NC FL2 (Signed)
[place] LEVEL OF CARE SCREENING TOOL     IDENTIFICATION  Patient Name: Grace Zamora Birthdate: [birth year] Sex: female Admission Date (Current Location): 06/21/2019  [facility] and [place] Number:  Herbalist and Address:         Provider Number: [id number]  Attending Physician Name and Address:  Roxan Hockey, MD  Relative Name and Phone Number:       Current Level of Care: Hospital Recommended Level of Care: Memory Care Prior Approval Number:    Date Approved/Denied:   PASRR Number:    Discharge Plan: Other (Comment)(Memory Care)    Current Diagnoses: Patient Active Problem List   Diagnosis Date Noted  . Closed bicondylar fracture of right tibial plateau 06/24/2019  . Fall at home, initial encounter 06/21/2019  . Atrial fibrillation, chronic 06/21/2019  . Essential hypertension 06/21/2019  . Alzheimer's dementia ([place]) 06/21/2019  . Type 2 diabetes mellitus ([place]) 06/21/2019    Orientation RESPIRATION BLADDER Height & Weight     Self  Normal Continent Weight: 178 lb 2.1 oz (80.8 kg) Height:  5' (152.4 cm)  BEHAVIORAL SYMPTOMS/MOOD NEUROLOGICAL BOWEL NUTRITION STATUS      Continent Diet(heart healthy/ carb modified)  AMBULATORY STATUS COMMUNICATION OF NEEDS Skin   Limited Assist Verbally Other (Comment)(Incision- right leg)                       Personal Care Assistance Level of Assistance  Bathing, Feeding, Dressing Bathing Assistance: Limited assistance Feeding assistance: Limited assistance Dressing Assistance: Limited assistance     Functional Limitations Info             SPECIAL CARE FACTORS FREQUENCY  PT (By licensed PT), OT (By licensed OT)     PT Frequency: PT at facility              Contractures      Additional Factors Info  Code Status, Allergies Code Status Info: DNR Allergies Info: Lisinopril Niacin And Related Sulfa Antibiotics           Current Medications (06/25/2019):  This is the current  hospital active medication list Current Facility-Administered Medications  Medication Dose Route Frequency Provider Last Rate Last Dose  . 0.9 %  sodium chloride infusion   Intravenous Continuous Roxan Hockey, MD 20 mL/hr at 06/24/19 0851    . acetaminophen (TYLENOL) tablet 650 mg  650 mg Oral Q6H Patrecia Pace A, PA-C   650 mg at 06/25/19 1147  . diltiazem (CARDIZEM CD) 24 hr capsule 180 mg  180 mg Oral Daily Patrecia Pace A, PA-C   180 mg at 06/25/19 0845  . donepezil (ARICEPT) tablet 10 mg  10 mg Oral QHS Patrecia Pace A, PA-C   10 mg at 06/24/19 2005  . ferrous sulfate tablet 325 mg  325 mg Oral BID WC Delray Alt, PA-C   325 mg at 06/25/19 0845  . fluticasone furoate-vilanterol (BREO ELLIPTA) 100-25 MCG/INH 1 puff  1 puff Inhalation Daily Delray Alt, PA-C   1 puff at 06/25/19 0915  . furosemide (LASIX) tablet 40 mg  40 mg Oral BID Delray Alt, PA-C   40 mg at 06/25/19 0845  . gabapentin (NEURONTIN) capsule 100 mg  100 mg Oral TID Patrecia Pace A, PA-C   100 mg at 06/25/19 0845  . guaiFENesin (ROBITUSSIN) 100 MG/5ML solution 200 mg  200 mg Oral TID PRN Delray Alt, PA-C      . HYDROcodone-acetaminophen (NORCO/VICODIN) 5-325  MG per tablet 1-2 tablet  1-2 tablet Oral Q4H PRN Despina HiddenYacobi, Sarah A, PA-C   1 tablet at 06/24/19 [id number]  . HYDROmorphone (DILAUDID) injection 0.5-1 mg  0.5-1 mg Intravenous Q3H PRN Ulyses SouthwardYacobi, Sarah A, PA-C      . insulin aspart (novoLOG) injection 0-15 Units  0-15 Units Subcutaneous TID WC Ulyses SouthwardYacobi, Sarah A, PA-C   2 Units at 06/25/19 1147  . insulin aspart (novoLOG) injection 0-5 Units  0-5 Units Subcutaneous QHS Despina HiddenYacobi, Sarah A, PA-C   3 Units at 06/23/19 2105  . insulin glargine (LANTUS) injection 12 Units  12 Units Subcutaneous QPM Despina HiddenYacobi, Sarah A, PA-C   12 Units at 06/24/19 1707  . ipratropium-albuterol (DUONEB) 0.5-2.5 (3) MG/3ML nebulizer solution 3 mL  3 mL Nebulization Q2H PRN Ulyses SouthwardYacobi, Sarah A, PA-C      . lactated ringers infusion   Intravenous  Continuous Despina HiddenYacobi, Sarah A, PA-C 10 mL/hr at 06/23/19 1037    . liver oil-zinc oxide (DESITIN) 40 % ointment   Topical TID PRN Despina HiddenYacobi, Sarah A, PA-C      . memantine (NAMENDA XR) 24 hr capsule 14 mg  14 mg Oral QPM Ulyses SouthwardYacobi, Sarah A, PA-C   14 mg at 06/24/19 1707  . metoprolol tartrate (LOPRESSOR) tablet 25 mg  25 mg Oral BID Ulyses SouthwardYacobi, Sarah A, PA-C   25 mg at 06/25/19 0845  . mirtazapine (REMERON) tablet 7.5 mg  7.5 mg Oral QHS Ulyses SouthwardYacobi, Sarah A, PA-C   7.5 mg at 06/24/19 2005  . ondansetron (ZOFRAN) tablet 4 mg  4 mg Oral Q6H PRN Ulyses SouthwardYacobi, Sarah A, PA-C       Or  . ondansetron [facility](ZOFRAN) injection 4 mg  4 mg Intravenous Q6H PRN Despina HiddenYacobi, Sarah A, PA-C      . pantoprazole (PROTONIX) EC tablet 40 mg  40 mg Oral Daily Ulyses SouthwardYacobi, Sarah A, PA-C   40 mg at 06/25/19 0845  . potassium chloride SA (K-DUR) CR tablet 20 mEq  20 mEq Oral BID Ulyses SouthwardYacobi, Sarah A, PA-C   20 mEq at 06/25/19 0845  . senna-docusate (Senokot-S) tablet 2 tablet  2 tablet Oral BID Shon HaleEmokpae, Courage, MD   2 tablet at 06/25/19 0845  . sertraline (ZOLOFT) tablet 25 mg  25 mg Oral Daily Ulyses SouthwardYacobi, Sarah A, PA-C   25 mg at 06/25/19 0844  . traZODone (DESYREL) tablet 50 mg  50 mg Oral QHS Ulyses SouthwardYacobi, Sarah A, PA-C   50 mg at 06/24/19 2004  . warfarin (COUMADIN) tablet 4 mg  4 mg Oral ONCE-1800 Jamse MeadGadhia, Jigna M, [facility]      . Warfarin - Pharmacist Dosing Inpatient   Does not apply q1800 Bertram MillardMaccia, Michael A, [facility]         Discharge Medications: Please see discharge summary for a list of discharge medications.  Relevant Imaging Results:  Relevant Lab Results:   Additional Information   weekend number: [id number]  Donnie CoffinErin M Margarie Mcguirt, LCSW

## 2019-06-25 NOTE — Progress Notes (Signed)
Patient Demographics:    Grace Zamora, is a 84 y.o. female, DOB - [birth year], ZOX:[id number]  Admit date - 06/21/2019   Admitting Physician Hughie Closs, MD  Outpatient Primary MD for the patient is Grace Northern, MD  LOS - 4   Chief Complaint  Patient presents with   Fall   Leg Injury        Subjective:    [name] today has no fevers, no emesis,  No chest pain, pleasantly confused in no acute distress, oral intake is fair no new concerns  Assessment  & Plan :    Principal Problem:   Closed bicondylar fracture of right tibial plateau Active Problems:   Fall at home, initial encounter   Atrial fibrillation, chronic   Essential hypertension   Alzheimer's dementia (HCC)   Type 2 diabetes mellitus (HCC)  Brief Summary- Patient is an 84 year old female with history relevant for dementia, chronic atrial fibrillation on Coumadin, unknown type congestive heart failure, type 2 diabetes mellitus, hypertension did on 06/21/2019 with fall tib-fib fracture, underwent ORIF on 06/23/2019, awaiting possible transfer back to [facility] versus SNF  A/p 1)Right Bicondylar Tibial Plateau Fracture -- Open reduction internal fixation of right tibial plateau fracture on 06/23/2019 by Dr. Jena Gauss,  ---per ortho Weightbearing: WBAT RLE for transfers and  Insicional and dressing care per orthpedics               2)Acute blood loss anemia--hemoglobin stable at 10.1 (was 12.7 pre-op),, monitor closely and transfuse as needed, continue iron supplementation  3) chronic A. Fib--stable, continue Cardizem CD 180 mg daily, continue metoprolol 25 mg twice daily for rate control, restarted Coumadin on 06/24/19, INR is 1.9,  risk versus benefits of anticoagulation discussed with POA/daughter,, , per daughter only 2 significant falls in the last couple of years PTA----  reconsider risk versus benefit of Coumadin if recurrent  falls  4)HTN-stable, continue Cardizem and metoprolol   5)DM type 2--- continue Lantus insulin 12 units every evening, Use Novolog/Humalog Sliding scale insulin with Accu-Cheks/Fingersticks as ordered   6)Alzheimer's Disease--oriented x1, continue Aricept and Namenda, continue Remeron 7.5 mg nightly and trazodone 59 nightly and Zoloft 25 mg daily  7)Social/Ethics--- Discussed with daughter/POA--[name] --at [phone]  , patient is a DNR/DNI . daughter prefers [facility] than [facility]  Disposition/Need for in-Hospital Stay- patient unable to be discharged at this time due to awaiting [facility]  to decide if they can accept patient back otherwise patient will have to go to SNF for rehab    Code Status : DNR  Family Communication:    Discussed with daughter/POA--[name] --at [phone]   Disposition Plan  : daughter prefers transfer back to [facility] rather than SNF  Consults  :  ortho  DVT Prophylaxis  : coumadin- SCDs   Lab Results  Component Value Date   PLT 217 06/25/2019   Inpatient Medications  Scheduled Meds:  acetaminophen  650 mg Oral Q6H   diltiazem  180 mg Oral Daily   donepezil  10 mg Oral QHS   ferrous sulfate  325 mg Oral BID WC   fluticasone furoate-vilanterol  1 puff Inhalation Daily   furosemide  40 mg Oral BID  gabapentin  100 mg Oral TID   insulin aspart  0-15 Units Subcutaneous TID WC   insulin aspart  0-5 Units Subcutaneous QHS   insulin glargine  12 Units Subcutaneous QPM   memantine  14 mg Oral QPM   metoprolol tartrate  25 mg Oral BID   mirtazapine  7.5 mg Oral QHS   pantoprazole  40 mg Oral Daily   potassium chloride SA  20 mEq Oral BID   senna-docusate  2 tablet Oral BID   sertraline  25 mg Oral Daily   traZODone  50 mg Oral QHS   Warfarin - Pharmacist Dosing Inpatient   Does not apply q1800   Continuous Infusions:  sodium chloride 20 mL/hr at 06/24/19 0851    lactated ringers 10 mL/hr at 06/23/19 1037   PRN Meds:.guaiFENesin, HYDROcodone-acetaminophen, HYDROmorphone (DILAUDID) injection, ipratropium-albuterol, liver oil-zinc oxide, ondansetron **OR** ondansetron (ZOFRAN) IV   Anti-infectives (From admission, onward)   Start     Dose/Rate Route Frequency Ordered Stop   06/23/19 2000  ceFAZolin (ANCEF) IVPB 2g/100 mL premix     2 g 200 mL/hr over 30 Minutes Intravenous Every 8 hours 06/23/19 1347 06/24/19 1421   06/23/19 1158  vancomycin (VANCOCIN) powder  Status:  Discontinued       As needed 06/23/19 1159 06/23/19 1256        Objective:   Vitals:   06/25/19 0403 06/25/19 0757 06/25/19 0916 06/25/19 1650  BP: 111/76 122/71  (!) 106/52  Pulse: 64 69 78 89  Resp: Temp: 97.6 F (36.4 C)   98.4 F (36.9 C)  TempSrc: Oral   Oral  SpO2: 95% 95% 95% 93%  Weight:      Height:        Wt Readings from Last 3 Encounters:  06/23/19 80.8 kg  04/25/19 77.1 kg     Intake/Output Summary (Last 24 hours) at 06/25/2019 1740 Last data filed at 06/25/2019 1453 Gross per 24 hour  Intake 920 ml  Output 1200 ml  Net -280 ml   Physical Exam  Gen:- Awake Alert, pleasantly confused only oriented to person HEENT:- [place].AT, No sclera icterus Neck-Supple Neck,No JVD,.  Lungs-  CTAB , fair symmetrical air movement CV- S1, S2 normal, irregular  Abd-  +ve B.Sounds, Abd Soft, No tenderness,    Extremity/Skin:-Right lower extremity with dressing/splint on,  cap refill noted.,  pedal pulses present  Psych-affect is appropriate, oriented x 1 Neuro-generalized weakness, unsteady gait , no new focal deficits, no tremors   Data Review:   Micro Results Recent Results (from the past 240 hour(s))  SARS Coronavirus 2 (CEPHEID - Performed in [facility] hospital lab), Hosp Order     Status: None   Collection Time: 06/21/19  6:01 AM   Specimen: Nasopharyngeal Swab  Result Value Ref Range Status   SARS Coronavirus 2 NEGATIVE NEGATIVE Final     Comment: (NOTE) If result is NEGATIVE SARS-CoV-2 target nucleic acids are NOT DETECTED. The SARS-CoV-2 RNA is generally detectable in upper and lower  respiratory specimens during the acute phase of infection. The lowest  concentration of SARS-CoV-2 viral copies this assay can detect is 250  copies / mL. A negative result does not preclude SARS-CoV-2 infection  and should not be used as the sole basis for treatment or other  patient management decisions.  A negative result may occur with  improper specimen collection / handling, submission of specimen other  than nasopharyngeal swab, presence of viral mutation(s) within  the  areas targeted by this assay, and inadequate number of viral copies  (<250 copies / mL). A negative result must be combined with clinical  observations, patient history, and epidemiological information. If result is POSITIVE SARS-CoV-2 target nucleic acids are DETECTED. The SARS-CoV-2 RNA is generally detectable in upper and lower  respiratory specimens dur ing the acute phase of infection.  Positive  results are indicative of active infection with SARS-CoV-2.  Clinical  correlation with patient history and other diagnostic information is  necessary to determine patient infection status.  Positive results do  not rule out bacterial infection or co-infection with other viruses. If result is PRESUMPTIVE POSTIVE SARS-CoV-2 nucleic acids MAY BE PRESENT.   A presumptive positive result was obtained on the submitted specimen  and confirmed on repeat testing.  While 2019 novel coronavirus  (SARS-CoV-2) nucleic acids may be present in the submitted sample  additional confirmatory testing may be necessary for epidemiological  and / or clinical management purposes  to differentiate between  SARS-CoV-2 and other Sarbecovirus currently known to infect humans.  If clinically indicated additional testing with an alternate test  methodology [phone] is advised. The SARS-CoV-2  RNA is generally  detectable in upper and lower respiratory sp ecimens during the acute  phase of infection. The expected result is Negative. Fact Sheet for Patients:  BoilerBrush.com.cyhttps://www.fda.gov/media/136312/download Fact Sheet for Healthcare Providers: https://pope.com/https://www.fda.gov/media/136313/download This test is not yet approved or cleared by the [place] FDA and has been authorized for detection and/or diagnosis of SARS-CoV-2 by FDA under an Emergency Use Authorization (EUA).  This EUA will remain in effect (meaning this test can be used) for the duration of the COVID-19 declaration under Section 564(b)(1) of the Act, 21 U.S.C. section 360bbb-3(b)(1), unless the authorization is terminated or revoked sooner. Performed at [facility], [address] [place], [place] [address]   Surgical pcr screen     Status: None   Collection Time: 06/21/19 11:18 AM   Specimen: Nasal Mucosa; Nasal Swab  Result Value Ref Range Status   MRSA, PCR NEGATIVE NEGATIVE Final   Staphylococcus aureus NEGATIVE NEGATIVE Final    Comment: (NOTE) The Xpert SA Assay (FDA approved for NASAL specimens in patients 82 years of age and older), is one component of a comprehensive surveillance program. It is not intended to diagnose infection nor to guide or monitor treatment. Performed at [facility], [address] [place], [place] [address]     Radiology Reports Ct Head Wo Contrast  Result Date: 06/21/2019 CLINICAL DATA:  Fall. EXAM: CT HEAD WITHOUT CONTRAST CT CERVICAL SPINE WITHOUT CONTRAST TECHNIQUE: Multidetector CT imaging of the head and cervical spine was performed following the standard protocol without intravenous contrast. Multiplanar CT image reconstructions of the cervical spine were also generated. COMPARISON:  CT scan of Apr 25, 2019. FINDINGS: CT HEAD FINDINGS Brain: Mild diffuse cortical atrophy is noted. Mild chronic ischemic white matter disease is noted. No mass effect or  midline shift is noted. Ventricular size is within normal limits. There is no evidence of mass lesion, hemorrhage or acute infarction. Vascular: No hyperdense vessel or unexpected calcification. Skull: Normal. Negative for fracture or focal lesion. Sinuses/Orbits: No acute finding. Other: None. CT CERVICAL SPINE FINDINGS Alignment: Stable grade 1 anterolisthesis of C4-5 is noted secondary to posterior facet joint hypertrophy. Pole C7 fracture is noted. No acute fracture is noted. Skull base and vertebrae: No acute fracture. No primary bone lesion or focal pathologic process. Soft tissues and spinal  canal: No prevertebral fluid or swelling. No visible canal hematoma. Disc levels:  Severe degenerative disc disease is noted at C5-6. Upper chest: Negative. Other: Degenerative changes are seen involving posterior facet joints bilaterally. IMPRESSION: Mild diffuse cortical atrophy. Mild chronic ischemic white matter disease. No acute intracranial abnormality seen. Stable degenerative changes are noted in cervical spine as described above. No acute abnormality is noted. Electronically Signed   By: Lupita RaiderJames  Green Jr M.D.   On: 06/21/2019 07:39   Ct Cervical Spine Wo Contrast  Result Date: 06/21/2019 CLINICAL DATA:  Fall. EXAM: CT HEAD WITHOUT CONTRAST CT CERVICAL SPINE WITHOUT CONTRAST TECHNIQUE: Multidetector CT imaging of the head and cervical spine was performed following the standard protocol without intravenous contrast. Multiplanar CT image reconstructions of the cervical spine were also generated. COMPARISON:  CT scan of Apr 25, 2019. FINDINGS: CT HEAD FINDINGS Brain: Mild diffuse cortical atrophy is noted. Mild chronic ischemic white matter disease is noted. No mass effect or midline shift is noted. Ventricular size is within normal limits. There is no evidence of mass lesion, hemorrhage or acute infarction. Vascular: No hyperdense vessel or unexpected calcification. Skull: Normal. Negative for fracture or focal  lesion. Sinuses/Orbits: No acute finding. Other: None. CT CERVICAL SPINE FINDINGS Alignment: Stable grade 1 anterolisthesis of C4-5 is noted secondary to posterior facet joint hypertrophy. Pole C7 fracture is noted. No acute fracture is noted. Skull base and vertebrae: No acute fracture. No primary bone lesion or focal pathologic process. Soft tissues and spinal canal: No prevertebral fluid or swelling. No visible canal hematoma. Disc levels:  Severe degenerative disc disease is noted at C5-6. Upper chest: Negative. Other: Degenerative changes are seen involving posterior facet joints bilaterally. IMPRESSION: Mild diffuse cortical atrophy. Mild chronic ischemic white matter disease. No acute intracranial abnormality seen. Stable degenerative changes are noted in cervical spine as described above. No acute abnormality is noted. Electronically Signed   By: Lupita RaiderJames  Green Jr M.D.   On: 06/21/2019 07:39   Ct Knee Right Wo Contrast  Result Date: 06/21/2019 CLINICAL DATA:  Evaluate complex tibia fracture. EXAM: CT OF THE right KNEE WITHOUT CONTRAST TECHNIQUE: Multidetector CT imaging of the technique right knee was performed according to the standard protocol. Multiplanar CT image reconstructions were also generated. COMPARISON:  Radiographs same date. FINDINGS: There is an inverted T-shaped proximal tibia fracture with a long longitudinal fracture extending between the tibial spines down to the transverse component of the fracture at the metadiaphyseal junction. There is 1 cm of lateral displacement and 9 mm of posterior displacement of the transverse fracture. Associated comminuted fracture of the fibular head and neck. No fracture of the femur or patella. There is a large joint effusion/lipohemarthrosis. There is also extensive subcutaneous soft tissue swelling/edema/hemorrhage in the anterior soft tissues. Grossly the cruciate and collateral ligaments are intact and the quadriceps and patellar tendons are intact.  Moderate to advanced vascular calcifications noted. IMPRESSION: 1. Inverted T shaped fracture of the proximal tibia as detailed above. 2. Comminuted fracture of the fibular head and neck. 3. Large lipohemarthrosis. 4. Grossly intact knee ligaments. Electronically Signed   By: Rudie MeyerP.  Gallerani M.D.   On: 06/21/2019 08:00   Dg Chest Port 1 View  Result Date: 06/21/2019 CLINICAL DATA:  Preoperative tibia fracture repair EXAM: PORTABLE CHEST 1 VIEW COMPARISON:  04/25/2019 chest radiograph. FINDINGS: Stable cardiomediastinal silhouette with normal heart size. No pneumothorax. No pleural effusion. Lungs appear clear, with no acute consolidative airspace disease and no pulmonary edema. IMPRESSION: No active  disease. Electronically Signed   By: Ilona Sorrel M.D.   On: 06/21/2019 08:19   Dg Knee Complete 4 Views Right  Result Date: 06/23/2019 CLINICAL DATA:  Closed reduction and internal fixation of proximal tibial fractures. EXAM: RIGHT KNEE - COMPLETE 4+ VIEW; DG C-ARM 61-120 MIN COMPARISON:  None. FINDINGS: Fluoroscopic spot images demonstrate reduction of the proximal tibia fractures with placement of a lateral sideplate and screws. Good position and alignment without complicating features. IMPRESSION: Open reduction and internal fixation of a complex proximal tibia fracture. Hardware in good position without complicating features. Electronically Signed   By: Marijo Sanes M.D.   On: 06/23/2019 14:34   Dg Knee Complete 4 Views Right  Result Date: 06/21/2019 CLINICAL DATA:  Fall. EXAM: RIGHT KNEE - COMPLETE 4+ VIEW COMPARISON:  None. FINDINGS: Tibial metaphysis fracture with dorsal impaction. No visible extension to the joint space. There is soft tissue swelling and joint effusion. Fibular neck is likely also fractured. Prominent osteopenia. IMPRESSION: 1. Angulated tibial metaphysis fracture. 2. Fibular neck fracture. Electronically Signed   By: Monte Fantasia M.D.   On: 06/21/2019 05:28   Dg Tibia/fibula  Right Port  Result Date: 06/23/2019 CLINICAL DATA:  Proximal tibial fracture EXAM: PORTABLE RIGHT TIBIA AND FIBULA - 2 VIEW COMPARISON:  Intraoperative films from earlier in the same day. FINDINGS: Lateral fixation sideplate is noted along the proximal tibia with multiple fixation screws. Fracture fragments are in near anatomic alignment. Proximal fibular fracture is again noted. IMPRESSION: Status post ORIF of proximal tibial fracture. Electronically Signed   By: Inez Catalina M.D.   On: 06/23/2019 15:42   Dg C-arm 1-60 Min  Result Date: 06/23/2019 CLINICAL DATA:  Closed reduction and internal fixation of proximal tibial fractures. EXAM: RIGHT KNEE - COMPLETE 4+ VIEW; DG C-ARM 61-120 MIN COMPARISON:  None. FINDINGS: Fluoroscopic spot images demonstrate reduction of the proximal tibia fractures with placement of a lateral sideplate and screws. Good position and alignment without complicating features. IMPRESSION: Open reduction and internal fixation of a complex proximal tibia fracture. Hardware in good position without complicating features. Electronically Signed   By: Marijo Sanes M.D.   On: 06/23/2019 14:34   Dg Hips Bilat W Or Wo Pelvis 3-4 Views  Result Date: 06/21/2019 CLINICAL DATA:  Fall. EXAM: DG HIP (WITH OR WITHOUT PELVIS) 3-4V BILAT COMPARISON:  None. FINDINGS: Limited under penetrated study with rotation on the pelvis view. No evidence of hip fracture or dislocation. There is a bipolar left hip hemiarthroplasty. IMPRESSION: No acute finding. Electronically Signed   By: Monte Fantasia M.D.   On: 06/21/2019 05:29     CBC Recent Labs  Lab 06/21/19 0601 06/22/19 0422 06/23/19 0016 06/24/19 0710 06/25/19 0859  WBC 12.2* 9.6 10.8* 11.3* 10.7*  HGB 12.7 11.4* 10.8* 9.9* 10.1*  HCT 39.6 34.5* 32.0* 30.5* 31.2*  PLT 214 210 205 205 217  MCV 94.3 91.8 90.7 94.1 94.8  MCH 30.2 30.3 30.6 30.6 30.7  MCHC 32.1 33.0 33.8 32.5 32.4  RDW 14.3 14.2 13.7 13.9 14.1  LYMPHSABS 0.7  --   --   --    --   MONOABS 0.6  --   --   --   --   EOSABS 0.1  --   --   --   --   BASOSABS 0.0  --   --   --   --     Chemistries  Recent Labs  Lab 06/21/19 0601 06/22/19 0422 06/23/19 0016 06/24/19 0655 06/25/19  0859  NA 140 137 135 139 139  K 4.1 3.9 3.2* 4.5 4.5  CL 106 101 98 101 105  CO2 23 24 25 26  21*  GLUCOSE 193* 171* 176* 198* 155*  BUN 25* 17 19 33* 46*  CREATININE 0.99 1.14* 1.27* 1.36* 1.41*  CALCIUM 9.2 9.3 8.7* 9.1 8.9  AST  --  18 16  --   --   ALT  --  14 13  --   --   ALKPHOS  --  84 77  --   --   BILITOT  --  1.0 1.0  --   --    ------------------------------------------------------------------------------------------------------------------ No results for input(s): CHOL, HDL, LDLCALC, TRIG, CHOLHDL, LDLDIRECT in the last 72 hours.  No results found for: HGBA1C ------------------------------------------------------------------------------------------------------------------ No results for input(s): TSH, T4TOTAL, T3FREE, THYROIDAB in the last 72 hours.  Invalid input(s): FREET3 ------------------------------------------------------------------------------------------------------------------ No results for input(s): VITAMINB12, FOLATE, FERRITIN, TIBC, IRON, RETICCTPCT in the last 72 hours.  Coagulation profile Recent Labs  Lab 06/21/19 0601 06/21/19 1402 06/22/19 0422 06/25/19 0317  INR 1.9* 2.0* 2.0* 1.9*    No results for input(s): DDIMER in the last 72 hours.  Cardiac Enzymes No results for input(s): CKMB, TROPONINI, MYOGLOBIN in the last 168 hours.  Invalid input(s): CK ------------------------------------------------------------------------------------------------------------------ No results found for: BNP   Shon Haleourage Lovelle Deitrick M.D on 06/25/2019 at 5:40 PM  Go to [email or web address] - for contact info  [facility] - Office  [phone]

## 2019-06-25 NOTE — Progress Notes (Signed)
[place] for warfarin Indication: atrial fibrillation  Allergies  Allergen Reactions  . Lisinopril   . Niacin And Related   . Sulfa Antibiotics     Patient Measurements: Height: 5' (152.4 cm) Weight: 178 lb 2.1 oz (80.8 kg) IBW/kg (Calculated) : 45.5  Vital Signs: Temp: 97.6 F (36.4 C) (07/03 0403) Temp Source: Oral (07/03 0403) BP: 122/71 (07/03 0757) Pulse Rate: 78 (07/03 0916)  Labs: Recent Labs    06/23/19 0016 06/24/19 0655 06/24/19 0710 06/25/19 0317 06/25/19 0859  HGB 10.8*  --  9.9*  --  10.1*  HCT 32.0*  --  30.5*  --  31.2*  PLT 205  --  205  --  217  LABPROT  --   --   --  21.7*  --   INR  --   --   --  1.9*  --   CREATININE 1.27* 1.36*  --   --  1.41*    Estimated Creatinine Clearance: 28.9 mL/min (A) (by C-G formula based on SCr of 1.41 mg/dL (H)).  Assessment: CC/HPI: 84 y/o F presenting with tibial fx - s/p ORIF 7/1  PMH: dementia, a-fib on warfarin, CHF, DM2, HTN  PTA warfarin regimen: 2mg  Mondays, 3mg  AOD - last dose PTA 6/28  Pharmacy consulted to resume warfarin on 7/2.   INR = 1.9 today, CBC: Hgb 10.1 stable, Pltc WNL  Goal of Therapy:  INR 2-3 Monitor platelets by anticoagulation protocol: Yes   Plan:  Warfarin 4mg  PO x 1 today Daily PT/INR Monitor closely for s/sx of bleeding    Lindell Spar, PharmD, BCPS Clinical Pharmacist Please check AMION for all [place] numbers 06/25/2019 2:44 PM

## 2019-06-25 NOTE — Plan of Care (Signed)
  Problem: Elimination: Goal: Will not experience complications related to bowel motility Outcome: Progressing   Problem: Safety: Goal: Ability to remain free from injury will improve Outcome: Progressing   Problem: Skin Integrity: Goal: Risk for impaired skin integrity will decrease Outcome: Progressing   

## 2019-06-25 NOTE — Progress Notes (Signed)
Subjective: 2 Days Post-Op Procedure(s) (LRB): OPEN REDUCTION INTERNAL FIXATION (ORIF) PROXIMAL TIBIA FRACTURE (Right) Patient reports pain as mild.    Objective: Vital signs in last 24 hours: Temp:  [97.6 F (36.4 C)-98.4 F (36.9 C)] 98.4 F (36.9 C) (07/03 1650) Pulse Rate:  [64-89] 89 (07/03 1650) Resp:  [14-18] 16 (07/03 1650) BP: (106-122)/(52-76) 106/52 (07/03 1650) SpO2:  [93 %-98 %] 93 % (07/03 1650)  Intake/Output from previous day: 07/02 0701 - 07/03 0700 In: 1263.7 [P.O.:960; I.V.:103.7; IV Piggyback:200] Out: 1400 [Urine:1400] Intake/Output this shift: Total I/O In: 440 [P.O.:440] Out: 600 [Urine:600]  Recent Labs    06/23/19 0016 06/24/19 0710 06/25/19 0859  HGB 10.8* 9.9* 10.1*   Recent Labs    06/24/19 0710 06/25/19 0859  WBC 11.3* 10.7*  RBC 3.24* 3.29*  HCT 30.5* 31.2*  PLT 205 217   Recent Labs    06/24/19 0655 06/25/19 0859  NA 139 139  K 4.5 4.5  CL 101 105  CO2 26 21*  BUN 33* 46*  CREATININE 1.36* 1.41*  GLUCOSE 198* 155*  CALCIUM 9.1 8.9   Recent Labs    06/25/19 0317  INR 1.9*    Neurovascular intact Sensation intact distally Intact pulses distally Dorsiflexion/Plantar flexion intact Incision: slight bloody drainage from incisions, fracture blisters with weaping from skin dressing changed today   Assessment/Plan: 2 Days Post-Op Procedure(s) (LRB): OPEN REDUCTION INTERNAL FIXATION (ORIF) PROXIMAL TIBIA FRACTURE (Right) Up with therapy WB RLE for transfers  dsg change prn Pain control as ordered D/c per med, may d/c from ortho standpoint     Chriss Czar 06/25/2019, 5:19 PM

## 2019-06-26 DIAGNOSIS — J449 Chronic obstructive pulmonary disease, unspecified: Secondary | ICD-10-CM

## 2019-06-26 LAB — PROTIME-INR
INR: 1.8 — ABNORMAL HIGH (ref 0.8–1.2)
Prothrombin Time: 20.7 seconds — ABNORMAL HIGH (ref 11.4–15.2)

## 2019-06-26 LAB — GLUCOSE, CAPILLARY
Glucose-Capillary: 151 mg/dL — ABNORMAL HIGH (ref 70–99)
Glucose-Capillary: 144 mg/dL — ABNORMAL HIGH (ref 70–99)
Glucose-Capillary: 163 mg/dL — ABNORMAL HIGH (ref 70–99)

## 2019-06-26 MED ORDER — WARFARIN SODIUM 4 MG PO TABS
4.0000 mg | ORAL_TABLET | Freq: Once | ORAL | Status: AC
  Administered 2019-06-26: 4 mg via ORAL
  Filled 2019-06-26: qty 1

## 2019-06-26 MED ORDER — WARFARIN SODIUM 4 MG PO TABS: 4.0000 mg | ORAL_TABLET | Freq: Every day | ORAL | 0 refills | Status: DC

## 2019-06-26 MED ORDER — LANTUS SOLOSTAR 100 UNIT/ML ~~LOC~~ SOPN: 18.0000 [IU] | PEN_INJECTOR | Freq: Every day | SUBCUTANEOUS | 11 refills | Status: DC

## 2019-06-26 NOTE — TOC Transition Note (Signed)
Transition of Care [facility]) - CM/SW Discharge Note   Patient Details  Name: Grace Zamora MRN: [id number] Date of Birth: 1935/05/24  Transition of Care [facility]) CM/SW Contact:  Claudie Leach, RN Phone Number: 06/26/2019, 5:54 PM   Clinical Narrative:    Pt discharging back to ALF.  ALF requests bed and wheelchair.  D/W daughter, Horris Latino, who agrees.  Bed and WC orders and documentation obtained.  Due to time of day when patient accepted back to ALF, DME will not be delivered today.  [employer] reg, Suanne Marker, states it can be delivered tomorrow.  [place] at ALF advised and agrees.     Final next level of care: Skilled Nursing Facility Barriers to Discharge: No Barriers Identified   Patient Goals and CMS Choice   CMS Medicare.gov Compare Post Acute Care list provided to:: Patient Represenative (must comment)(Bonnie (daughter)) Choice offered to / list presented to : Adult Children(Bonnie (daughter))  Discharge Placement              Patient chooses bed at: [place] Patient to be transferred to facility by: [facility] Name of family member notified: Horris Latino daughter Patient and family notified of of transfer: 06/26/19  Discharge Plan and Services     Post Acute Care Choice: (Assisted Living)          DME Arranged: Wheelchair manual, Hospital bed DME Agency: [facility]) Date DME Agency Contacted: 06/26/19 Time DME Agency Contacted: [phone] Representative spoke with at DME Agency: Fransisco Beau

## 2019-06-26 NOTE — Discharge Summary (Addendum)
Physician Discharge Summary  Carlyn ReichertMary Klas NFA:[id number] DOB: December 30, 1934 DOA: 06/21/2019  PCP: Eloisa NorthernAmin, Saad, MD  Admit date: 06/21/2019 Discharge date: 06/26/2019  Admitted From: ALF Disposition: ALF  Recommendations for Outpatient Follow-up:  1. Follow up with PCP in 3 to 4 days weeks 2. Follow-up with orthopedic surgery in 2 weeks 3. Check INR in 3 to 4 days 4. Please follow up on the following pending results: None  Home Health: PT/OT/RN Equipment/Devices: None  Discharge Condition: Stable CODE STATUS: DNR/DNI  Hospital Course: 84 year old female with history of advanced dementia, permanent A. fib on Coumadin, unknown type CHF, DM-2, COPD and HTN presenting with ground-level fall and subsequent right bicondylar tibial plateau fracture.  She underwent ORIF on 06/23/2019 by Dr. Jena GaussHaddix.  [facility] to [facility] in [facility] condition.  See individual problem list below for more.  Discharge Diagnoses:  Fall at ALF-unknown mechanism Right bicondylar tibial plateau fracture -ORIF by Dr. Jena GaussHaddix on 06/23/2019 -Per Ortho, WBAT, as needed dressing change, PT/OT and pain control  Permanent A. Fib: Rate controlled with Cardizem and metoprolol.  On Coumadin for anticoagulation.  Risk and benefit of anticoagulation discussed with his POA/daughter by previous provider.  Reportedly only 2 falls in the last couple of years. -Continue home Cardizem and metoprolol for rate control -Continue Coumadin per pharmacy for anticoagulation.  INR 1.8 today. -Check INR in the next 2 to 3 days.  Hypertension: Normotensive -Cardiac meds as above  IDDM-2: On 22 units of Lantus nightly at home.  CBG within fair range. -Discharged on Lantus 16 units daily.  Adjust insulin dose as appropriate. -CBG monitoring daily. -Continue low-dose gabapentin  COPD: Stable.  On room air. -Continue Breo Ellipta.  Acute blood loss anemia: Hgb 12.7 on admission.  Now 10.1.  Likely due to some blood loss from blood  draws and surgery.  No obvious signs of bleeding. -We will continue monitoring  Unknown type CHF: Appears euvolemic.  No cardiopulmonary symptoms. -Continue home Lasix 40 mg twice daily -Continue home potassium supplements  Alzheimer's dementia without behavioral disturbance: Oriented to self only.  On Namenda and Aricept at home -Not sure the benefit of Namenda and Aricept at this time. -We will be cautious with nodal blocking agents.  Social/Ethics: Per discussion between previous provider and daughter/POA--Bonnie Marina Goodellerry --at [phone]  , patient is a DNR/DNI . Daughter prefers transfer back to [facility] rather than SNF  Mood disorders: Stable -Continue home Zoloft, Remeron and tramadol.  Discharge Instructions  Discharge Instructions    Call MD for:  persistant dizziness or light-headedness   Complete by: As directed    Call MD for:  severe uncontrolled pain   Complete by: As directed    Call MD for:  temperature >100.4   Complete by: As directed    Diet - low sodium heart healthy   Complete by: As directed    Diet Carb Modified   Complete by: As directed      Allergies as of 06/26/2019      Reactions   Lisinopril    Niacin And Related    Sulfa Antibiotics       Medication List    STOP taking these medications   guaifenesin 100 MG/5ML syrup Commonly known as: ROBITUSSIN     TAKE these medications   acetaminophen 500 MG tablet Commonly known as: TYLENOL Take 500 mg by mouth 3 (three) times daily.   BAZA EX Apply 1 application topically as needed. Apply after brief chance  Breo Ellipta 100-25 MCG/INH Aepb Generic drug: fluticasone furoate-vilanterol Inhale 1 puff into the lungs daily. Rinse mouth after use   Calmoseptine 0.44-20.6 % Oint Generic drug: Menthol-Zinc Oxide Apply 1 application topically every 8 (eight) hours as needed (itching).   cholecalciferol 25 MCG (1000 UT) tablet Commonly known as: VITAMIN D Take 2,000 Units by  mouth daily.   diltiazem 120 MG 24 hr capsule Commonly known as: CARDIZEM CD Take 120 mg by mouth daily.   donepezil 10 MG tablet Commonly known as: ARICEPT Take 10 mg by mouth daily.   ferrous sulfate 325 (65 FE) MG tablet Take 325 mg by mouth 2 (two) times daily with a meal.   furosemide 40 MG tablet Commonly known as: LASIX Take 40 mg by mouth 2 (two) times a day.   gabapentin 100 MG capsule Commonly known as: NEURONTIN Take 100 mg by mouth 2 (two) times a day.   HYDROcodone-acetaminophen 5-325 MG tablet Commonly known as: NORCO/VICODIN Take 1 tablet by mouth every 6 (six) hours as needed for moderate pain.   ipratropium-albuterol 0.5-2.5 (3) MG/3ML Soln Commonly known as: DUONEB Take 3 mLs by nebulization every 2 (two) hours as needed (Shortness of breath).   Lantus SoloStar 100 UNIT/ML Solostar Pen Generic drug: Insulin Glargine Inject 18 Units into the skin daily. What changed:   how much to take  when to take this   metoprolol tartrate 25 MG tablet Commonly known as: LOPRESSOR Take 25 mg by mouth 2 (two) times a day.   mirtazapine 15 MG tablet Commonly known as: REMERON Take 7.5 mg by mouth at bedtime.   Namenda XR 14 MG Cp24 24 hr capsule Generic drug: memantine Take 14 mg by mouth every evening.   nystatin powder Generic drug: nystatin Apply 1 ampule topically daily.   omeprazole 20 MG capsule Commonly known as: PRILOSEC Take 20 mg by mouth daily.   potassium chloride SA 20 MEQ tablet Commonly known as: K-DUR Take 20 mEq by mouth 2 (two) times daily.   Psyllium 400 MG Caps Take 400 mg by mouth at bedtime.   senna-docusate 8.6-50 MG tablet Commonly known as: Senokot-S Take 1 tablet by mouth 3 (three) times a week. MWF   sertraline 25 MG tablet Commonly known as: ZOLOFT Take 25 mg by mouth daily.   traZODone 50 MG tablet Commonly known as: DESYREL Take 50 mg by mouth at bedtime.   trolamine salicylate 10 % cream Commonly known as:  ASPERCREME Apply 1 application topically 2 (two) times a day.   warfarin 4 MG tablet Commonly known as: Coumadin Take 1 tablet (4 mg total) by mouth daily at 6 PM for 30 doses. Start taking on: June 27, 2019 What changed:   medication strength  how much to take  when to take this  additional instructions  Another medication with the same name was removed. Continue taking this medication, and follow the directions you see here.            Durable Medical Equipment  (From admission, onward)         Start     Ordered   06/26/19 1438  For home use only DME Hospital bed  Once    Question Answer Comment  Length of Need Lifetime   Patient has (list medical condition): advanced dementia, permanent A. fib on Coumadin, unknown type CHF, DM-2, COPD, HTN, right bicondylar tibial plateau fracture.   The above medical condition requires: Patient requires the ability to reposition frequently  Head must be elevated greater than: 45 degrees   Bed type Semi-electric   Support Surface: Gel Overlay      06/26/19 1437   06/26/19 1438  For home use only DME lightweight manual wheelchair with seat cushion  Once    Comments: Patient suffers from right tibial plateau fracture, dementia which impairs their ability to perform daily activities like toileting, dressing, feeding, bathing in the home.  A walker will not resolve  issue with performing activities of daily living. A wheelchair will allow patient to safely perform daily activities. Patient is not able to propel themselves in the home using a standard weight wheelchair due to weakness. Patient can self propel in the lightweight wheelchair. Length of need lifetime. Accessories: elevating leg rests (ELRs), wheel locks, extensions and anti-tippers, back cushion.   06/26/19 1437         Follow-up Information    Haddix, Gillie Manners, MD. Schedule an appointment as soon as possible for a visit in 2 week(s).   Specialty: Orthopedic Surgery Why:  for repeat x-rays  Contact information: [address] [place] [address] [phone]        [name], MD. Schedule an appointment as soon as possible for a visit in 3 day(s).   Specialty: Internal Medicine Contact information: [address] [place] [address] [phone]           Consultations:  Orthopedic surgery  Procedures/Studies:  2D Echo: None  ORIF as above  Ct Head Wo Contrast  Result Date: 06/21/2019 CLINICAL DATA:  Fall. EXAM: CT HEAD WITHOUT CONTRAST CT CERVICAL SPINE WITHOUT CONTRAST TECHNIQUE: Multidetector CT imaging of the head and cervical spine was performed following the standard protocol without intravenous contrast. Multiplanar CT image reconstructions of the cervical spine were also generated. COMPARISON:  CT scan of Apr 25, 2019. FINDINGS: CT HEAD FINDINGS Brain: Mild diffuse cortical atrophy is noted. Mild chronic ischemic white matter disease is noted. No mass effect or midline shift is noted. Ventricular size is within normal limits. There is no evidence of mass lesion, hemorrhage or acute infarction. Vascular: No hyperdense vessel or unexpected calcification. Skull: Normal. Negative for fracture or focal lesion. Sinuses/Orbits: No acute finding. Other: None. CT CERVICAL SPINE FINDINGS Alignment: Stable grade 1 anterolisthesis of C4-5 is noted secondary to posterior facet joint hypertrophy. Pole C7 fracture is noted. No acute fracture is noted. Skull base and vertebrae: No acute fracture. No primary bone lesion or focal pathologic process. Soft tissues and spinal canal: No prevertebral fluid or swelling. No visible canal hematoma. Disc levels:  Severe degenerative disc disease is noted at C5-6. Upper chest: Negative. Other: Degenerative changes are seen involving posterior facet joints bilaterally. IMPRESSION: Mild diffuse cortical atrophy. Mild chronic ischemic white matter disease. No acute intracranial abnormality seen. Stable degenerative  changes are noted in cervical spine as described above. No acute abnormality is noted. Electronically Signed   By: Lupita Raider M.D.   On: 06/21/2019 07:39   Ct Cervical Spine Wo Contrast  Result Date: 06/21/2019 CLINICAL DATA:  Fall. EXAM: CT HEAD WITHOUT CONTRAST CT CERVICAL SPINE WITHOUT CONTRAST TECHNIQUE: Multidetector CT imaging of the head and cervical spine was performed following the standard protocol without intravenous contrast. Multiplanar CT image reconstructions of the cervical spine were also generated. COMPARISON:  CT scan of Apr 25, 2019. FINDINGS: CT HEAD FINDINGS Brain: Mild diffuse cortical atrophy is noted. Mild chronic ischemic white matter disease is noted. No mass effect or midline  shift is noted. Ventricular size is within normal limits. There is no evidence of mass lesion, hemorrhage or acute infarction. Vascular: No hyperdense vessel or unexpected calcification. Skull: Normal. Negative for fracture or focal lesion. Sinuses/Orbits: No acute finding. Other: None. CT CERVICAL SPINE FINDINGS Alignment: Stable grade 1 anterolisthesis of C4-5 is noted secondary to posterior facet joint hypertrophy. Pole C7 fracture is noted. No acute fracture is noted. Skull base and vertebrae: No acute fracture. No primary bone lesion or focal pathologic process. Soft tissues and spinal canal: No prevertebral fluid or swelling. No visible canal hematoma. Disc levels:  Severe degenerative disc disease is noted at C5-6. Upper chest: Negative. Other: Degenerative changes are seen involving posterior facet joints bilaterally. IMPRESSION: Mild diffuse cortical atrophy. Mild chronic ischemic white matter disease. No acute intracranial abnormality seen. Stable degenerative changes are noted in cervical spine as described above. No acute abnormality is noted. Electronically Signed   By: Lupita Raider M.D.   On: 06/21/2019 07:39   Ct Knee Right Wo Contrast  Result Date: 06/21/2019 CLINICAL DATA:  Evaluate  complex tibia fracture. EXAM: CT OF THE right KNEE WITHOUT CONTRAST TECHNIQUE: Multidetector CT imaging of the technique right knee was performed according to the standard protocol. Multiplanar CT image reconstructions were also generated. COMPARISON:  Radiographs same date. FINDINGS: There is an inverted T-shaped proximal tibia fracture with a long longitudinal fracture extending between the tibial spines down to the transverse component of the fracture at the metadiaphyseal junction. There is 1 cm of lateral displacement and 9 mm of posterior displacement of the transverse fracture. Associated comminuted fracture of the fibular head and neck. No fracture of the femur or patella. There is a large joint effusion/lipohemarthrosis. There is also extensive subcutaneous soft tissue swelling/edema/hemorrhage in the anterior soft tissues. Grossly the cruciate and collateral ligaments are intact and the quadriceps and patellar tendons are intact. Moderate to advanced vascular calcifications noted. IMPRESSION: 1. Inverted T shaped fracture of the proximal tibia as detailed above. 2. Comminuted fracture of the fibular head and neck. 3. Large lipohemarthrosis. 4. Grossly intact knee ligaments. Electronically Signed   By: Rudie Meyer M.D.   On: 06/21/2019 08:00   Dg Chest Port 1 View  Result Date: 06/21/2019 CLINICAL DATA:  Preoperative tibia fracture repair EXAM: PORTABLE CHEST 1 VIEW COMPARISON:  04/25/2019 chest radiograph. FINDINGS: Stable cardiomediastinal silhouette with normal heart size. No pneumothorax. No pleural effusion. Lungs appear clear, with no acute consolidative airspace disease and no pulmonary edema. IMPRESSION: No active disease. Electronically Signed   By: Delbert Phenix M.D.   On: 06/21/2019 08:19   Dg Knee Complete 4 Views Right  Result Date: 06/23/2019 CLINICAL DATA:  Closed reduction and internal fixation of proximal tibial fractures. EXAM: RIGHT KNEE - COMPLETE 4+ VIEW; DG C-ARM 61-120 MIN  COMPARISON:  None. FINDINGS: Fluoroscopic spot images demonstrate reduction of the proximal tibia fractures with placement of a lateral sideplate and screws. Good position and alignment without complicating features. IMPRESSION: Open reduction and internal fixation of a complex proximal tibia fracture. Hardware in good position without complicating features. Electronically Signed   By: Rudie Meyer M.D.   On: 06/23/2019 14:34   Dg Knee Complete 4 Views Right  Result Date: 06/21/2019 CLINICAL DATA:  Fall. EXAM: RIGHT KNEE - COMPLETE 4+ VIEW COMPARISON:  None. FINDINGS: Tibial metaphysis fracture with dorsal impaction. No visible extension to the joint space. There is soft tissue swelling and joint effusion. Fibular neck is likely also fractured. Prominent osteopenia.  IMPRESSION: 1. Angulated tibial metaphysis fracture. 2. Fibular neck fracture. Electronically Signed   By: Marnee SpringJonathon  Watts M.D.   On: 06/21/2019 05:28   Dg Tibia/fibula Right Port  Result Date: 06/23/2019 CLINICAL DATA:  Proximal tibial fracture EXAM: PORTABLE RIGHT TIBIA AND FIBULA - 2 VIEW COMPARISON:  Intraoperative films from earlier in the same day. FINDINGS: Lateral fixation sideplate is noted along the proximal tibia with multiple fixation screws. Fracture fragments are in near anatomic alignment. Proximal fibular fracture is again noted. IMPRESSION: Status post ORIF of proximal tibial fracture. Electronically Signed   By: Alcide CleverMark  Lukens M.D.   On: 06/23/2019 15:42   Dg C-arm 1-60 Min  Result Date: 06/23/2019 CLINICAL DATA:  Closed reduction and internal fixation of proximal tibial fractures. EXAM: RIGHT KNEE - COMPLETE 4+ VIEW; DG C-ARM 61-120 MIN COMPARISON:  None. FINDINGS: Fluoroscopic spot images demonstrate reduction of the proximal tibia fractures with placement of a lateral sideplate and screws. Good position and alignment without complicating features. IMPRESSION: Open reduction and internal fixation of a complex proximal tibia  fracture. Hardware in good position without complicating features. Electronically Signed   By: Rudie MeyerP.  Gallerani M.D.   On: 06/23/2019 14:34   Dg Hips Bilat W Or Wo Pelvis 3-4 Views  Result Date: 06/21/2019 CLINICAL DATA:  Fall. EXAM: DG HIP (WITH OR WITHOUT PELVIS) 3-4V BILAT COMPARISON:  None. FINDINGS: Limited under penetrated study with rotation on the pelvis view. No evidence of hip fracture or dislocation. There is a bipolar left hip hemiarthroplasty. IMPRESSION: No acute finding. Electronically Signed   By: Marnee SpringJonathon  Watts M.D.   On: 06/21/2019 05:29     Subjective: The night of this morning.  Patient with significant dementia only oriented to self.  No complaint.  Does not appear to be in distress.   Discharge Exam: Vitals:   06/26/19 1541 06/26/19 1603  BP:  110/77  Pulse: 74 90  Resp:  18  Temp:    SpO2:  98%    GENERAL: No acute distress.  Appears well.  HEENT: MMM.  Vision and hearing grossly intact.  NECK: Supple.  No apparent JVD. LUNGS:  No IWOB. Good air movement bilaterally. HEART: Regular rhythm.  Normal rate. 2/6 SEM over RUSB and LUSB. ABD: Bowel sounds present. Soft. Non tender.  MSK/EXT:  Moves all extremities. No apparent deformity.  Trace edema bilaterally. SKIN: Dressing over RLE appears clean and dry.  Cap refill is brisk distally. NEURO: Awake, alert and oriented to self only.  No gross deficit.  Gait deferred. PSYCH: Calm.   The results of significant diagnostics from this hospitalization (including imaging, microbiology, ancillary and laboratory) are listed below for reference.     Microbiology: Recent Results (from the past 240 hour(s))  SARS Coronavirus 2 (CEPHEID - Performed in [facility] lab), Hosp Order     Status: None   Collection Time: 06/21/19  6:01 AM   Specimen: Nasopharyngeal Swab  Result Value Ref Range Status   SARS Coronavirus 2 NEGATIVE NEGATIVE Final    Comment: (NOTE) If result is NEGATIVE SARS-CoV-2 target nucleic  acids are NOT DETECTED. The SARS-CoV-2 RNA is generally detectable in upper and lower  respiratory specimens during the acute phase of infection. The lowest  concentration of SARS-CoV-2 viral copies this assay can detect is 250  copies / mL. A negative result does not preclude SARS-CoV-2 infection  and should not be used as the sole basis for treatment or other  patient management decisions.  A negative result  may occur with  improper specimen collection / handling, submission of specimen other  than nasopharyngeal swab, presence of viral mutation(s) within the  areas targeted by this assay, and inadequate number of viral copies  (<250 copies / mL). A negative result must be combined with clinical  observations, patient history, and epidemiological information. If result is POSITIVE SARS-CoV-2 target nucleic acids are DETECTED. The SARS-CoV-2 RNA is generally detectable in upper and lower  respiratory specimens dur ing the acute phase of infection.  Positive  results are indicative of active infection with SARS-CoV-2.  Clinical  correlation with patient history and other diagnostic information is  necessary to determine patient infection status.  Positive results do  not rule out bacterial infection or co-infection with other viruses. If result is PRESUMPTIVE POSTIVE SARS-CoV-2 nucleic acids MAY BE PRESENT.   A presumptive positive result was obtained on the submitted specimen  and confirmed on repeat testing.  While 2019 novel coronavirus  (SARS-CoV-2) nucleic acids may be present in the submitted sample  additional confirmatory testing may be necessary for epidemiological  and / or clinical management purposes  to differentiate between  SARS-CoV-2 and other Sarbecovirus currently known to infect humans.  If clinically indicated additional testing with an alternate test  methodology [id number]) is advised. The SARS-CoV-2 RNA is generally  detectable in upper and lower respiratory  sp ecimens during the acute  phase of infection. The expected result is Negative. Fact Sheet for Patients:  StrictlyIdeas.no Fact Sheet for Healthcare Providers: BankingDealers.co.za This test is not yet approved or cleared by the [place] FDA and has been authorized for detection and/or diagnosis of SARS-CoV-2 by FDA under an Emergency Use Authorization (EUA).  This EUA will remain in effect (meaning this test can be used) for the duration of the COVID-19 declaration under Section 564(b)(1) of the Act, 21 U.S.C. section 360bbb-3(b)(1), unless the authorization is terminated or revoked sooner. Performed at [facility], [place] [address], [place], [place] [address]   Surgical pcr screen     Status: None   Collection Time: 06/21/19 11:18 AM   Specimen: Nasal Mucosa; Nasal Swab  Result Value Ref Range Status   MRSA, PCR NEGATIVE NEGATIVE Final   Staphylococcus aureus NEGATIVE NEGATIVE Final    Comment: (NOTE) The Xpert SA Assay (FDA approved for NASAL specimens in patients 64 years of age and older), is one component of a comprehensive surveillance program. It is not intended to diagnose infection nor to guide or monitor treatment. Performed at [facility], [place] [address], [place],  [address]      Labs: BNP (last 3 results) No results for input(s): BNP in the last 8760 hours. Basic Metabolic Panel: Recent Labs  Lab 06/21/19 0601 06/22/19 0422 06/23/19 0016 06/24/19 0655 06/25/19 0859  NA 140 137 135 139 139  K 4.1 3.9 3.2* 4.5 4.5  CL 106 101 98 101 105  CO2 23 24 25 26  21*  GLUCOSE 193* 171* 176* 198* 155*  BUN 25* 17 19 33* 46*  CREATININE 0.99 1.14* 1.27* 1.36* 1.41*  CALCIUM 9.2 9.3 8.7* 9.1 8.9   Liver Function Tests: Recent Labs  Lab 06/22/19 0422 06/23/19 0016  AST 18 16  ALT 14 13  ALKPHOS 84 77  BILITOT 1.0 1.0  PROT 6.6 6.3*  ALBUMIN 3.5 3.3*   No results for  input(s): LIPASE, AMYLASE in the last 168 hours. No results for input(s): AMMONIA in the last 168 hours. CBC: Recent Labs  Lab 06/21/19 0601 06/22/19 0422 06/23/19 0016 06/24/19 0710 06/25/19 0859  WBC 12.2* 9.6 10.8* 11.3* 10.7*  NEUTROABS 10.8*  --   --   --   --   HGB 12.7 11.4* 10.8* 9.9* 10.1*  HCT 39.6 34.5* 32.0* 30.5* 31.2*  MCV 94.3 91.8 90.7 94.1 94.8  PLT 214 210 205 205 217   Cardiac Enzymes: No results for input(s): CKTOTAL, CKMB, CKMBINDEX, TROPONINI in the last 168 hours. BNP: Invalid input(s): POCBNP CBG: Recent Labs  Lab 06/25/19 1649 06/25/19 1928 06/26/19 0814 06/26/19 1306 06/26/19 1600  GLUCAP 121* 145* 151* 144* 163*   D-Dimer No results for input(s): DDIMER in the last 72 hours. Hgb A1c No results for input(s): HGBA1C in the last 72 hours. Lipid Profile No results for input(s): CHOL, HDL, LDLCALC, TRIG, CHOLHDL, LDLDIRECT in the last 72 hours. Thyroid function studies No results for input(s): TSH, T4TOTAL, T3FREE, THYROIDAB in the last 72 hours.  Invalid input(s): FREET3 Anemia work up No results for input(s): VITAMINB12, FOLATE, FERRITIN, TIBC, IRON, RETICCTPCT in the last 72 hours. Urinalysis No results found for: COLORURINE, APPEARANCEUR, LABSPEC, PHURINE, GLUCOSEU, HGBUR, BILIRUBINUR, KETONESUR, PROTEINUR, UROBILINOGEN, NITRITE, LEUKOCYTESUR Sepsis Labs Invalid input(s): PROCALCITONIN,  WBC,  LACTICIDVEN   Time coordinating discharge: 35 minutes  SIGNED:  Almon Herculesaye T Darrah Dredge, MD  [facility] 06/26/2019, 5:11 PM  If 7PM-7AM, please contact night-coverage [email or web address] Password [email or web address]

## 2019-06-26 NOTE — Progress Notes (Signed)
[place] for warfarin Indication: atrial fibrillation  Allergies  Allergen Reactions  . Lisinopril   . Niacin And Related   . Sulfa Antibiotics     Patient Measurements: Height: 5' (152.4 cm) Weight: 178 lb 2.1 oz (80.8 kg) IBW/kg (Calculated) : 45.5  Vital Signs: Temp: 97.9 F (36.6 C) (07/04 0913) Temp Source: Oral (07/04 0913) BP: 113/99 (07/04 0913) Pulse Rate: 134 (07/04 0913)  Labs: Recent Labs    06/24/19 0655 06/24/19 0710 06/25/19 0317 06/25/19 0859 06/26/19 0409  HGB  --  9.9*  --  10.1*  --   HCT  --  30.5*  --  31.2*  --   PLT  --  205  --  217  --   LABPROT  --   --  21.7*  --  20.7*  INR  --   --  1.9*  --  1.8*  CREATININE 1.36*  --   --  1.41*  --     Estimated Creatinine Clearance: 28.9 mL/min (A) (by C-G formula based on SCr of 1.41 mg/dL (H)).  Assessment: CC/HPI: 84 y/o F presenting with tibial fx - s/p ORIF 7/1  PMH: dementia, a-fib on warfarin, CHF, DM2, HTN  PTA warfarin regimen: 2mg  Mondays, 3mg  AOD - last dose PTA 6/28  Pharmacy consulted to resume warfarin on 7/2.   INR = 1.8 today, CBC: Hgb 10.1 stable, Pltc WNL (on 7/3)  Goal of Therapy:  INR 2-3 Monitor platelets by anticoagulation protocol: Yes   Plan:  Warfarin 4mg  PO x 1 today Daily PT/INR Monitor closely for s/sx of bleeding    Lindell Spar, PharmD, BCPS Clinical Pharmacist Please check AMION for all [place] numbers 06/26/2019 10:10 AM

## 2019-06-26 NOTE — TOC Transition Note (Signed)
Transition of Care [facility]) - CM/SW Discharge Note   Patient Details  Name: Grace Zamora MRN: [id number] Date of Birth: 15-Sep-1935  [facility] of [facility] CM/SW Contact:  Bary Castilla, LCSW Phone Number: 06/26/2019, 4:28 PM   Clinical Narrative:     Patient will DC to:?[facility] Anticipated DC date:?06/26/19 Family notified:?[place] AN:VBTY   Per MD patient ready for DC to [facility]. RN, patient, patient's family, and facility notified of DC. Discharge Summary sent to facility. RN given number for [phone] report.. DC packet on chart. Ambulance transport requested for patient.  CSW signing off.   [place],  [phone]    Final next level of care: Skilled Nursing Facility Barriers to Discharge: No Barriers Identified   Patient Goals and CMS Choice   CMS Medicare.gov Compare Post Acute Care list provided to:: Patient Represenative (must comment)(Bonnie (daughter)) Choice offered to / list presented to : Adult Children(Bonnie (daughter))  Discharge Placement              Patient chooses bed at: [facility] Patient to be transferred to facility by: [place] Name of family member notified: [facility] Latino daughter Patient and family notified of of transfer: 06/26/19  Discharge Plan and Services     Post Acute Care Choice: (Assisted Living)                               Social Determinants of Health (SDOH) Interventions     Readmission Risk Interventions No flowsheet data found.

## 2019-06-26 NOTE — Plan of Care (Signed)
  Problem: Safety: Goal: Ability to remain free from injury will improve Outcome: Progressing   Problem: Skin Integrity: Goal: Risk for impaired skin integrity will decrease Outcome: Progressing   Problem: Pain Managment: Goal: General experience of comfort will improve Outcome: Progressing   

## 2019-06-26 NOTE — Progress Notes (Signed)
PROGRESS NOTE  Grace ReichertMary Weidmann ZOX:[id number] DOB: [birth year] DOA: 06/21/2019 PCP: Eloisa NorthernAmin, Saad, MD   LOS: 5 days   Patient is from: ALF  Brief Narrative / Interim history: 84 year old female with history of advanced dementia, permanent A. fib on Coumadin, unknown type CHF, DM-2, COPD and HTN presenting with ground-level fall and subsequent right bicondylar tibial plateau fracture.  She underwent ORIF on 06/23/2019 by Dr. headache and waiting on transfer back to either [facility] or SNF.  Subjective: No major events overnight of this morning.  No complaint this morning.  She denies pain but has advanced dementia and not a good historian.  Does not appear to be in distress.  Objective: Vitals:   06/25/19 1929 06/26/19 0343 06/26/19 0858 06/26/19 0913  BP: 109/63 (!) 96/40 (!) 113/99 (!) 113/99  Pulse: 86 68 80 (!) 134  Resp: 14 16    Temp: 98.7 F (37.1 C) (!) 97.4 F (36.3 C)  97.9 F (36.6 C)  TempSrc: Oral Oral  Oral  SpO2: 93% 94%  94%  Weight:      Height:        Intake/Output Summary (Last 24 hours) at 06/26/2019 1153 Last data filed at 06/26/2019 0300 Gross per 24 hour  Intake 360 ml  Output 850 ml  Net -490 ml   Filed Weights   06/22/19 0500 06/23/19 0500  Weight: 80.1 kg 80.8 kg    Examination:  GENERAL: No acute distress.  Appears well.  HEENT: MMM.  Vision and hearing grossly intact.  NECK: Supple.  No apparent JVD. LUNGS:  No IWOB. Good air movement bilaterally. HEART: Irregular rhythm.  Normal rate.  2/6 SEM over RUSB and LUSB. ABD: Bowel sounds present. Soft. Non tender.  MSK/EXT:  Moves extremities. No apparent deformity.  Trace edema bilaterally. SKIN: RLE dressing appears clean and dry.  Cap refill is brisk. NEURO: Awake, alert and oriented to self only.  No gross deficit.  Gait deferred. PSYCH: Calm. Normal affect.   I have personally reviewed the following labs and images:  Radiology Studies: No results found.  Microbiology: Recent Results (from the  past 240 hour(s))  SARS Coronavirus 2 (CEPHEID - Performed in [facility] hospital lab), Hosp Order     Status: None   Collection Time: 06/21/19  6:01 AM   Specimen: Nasopharyngeal Swab  Result Value Ref Range Status   SARS Coronavirus 2 NEGATIVE NEGATIVE Final    Comment: (NOTE) If result is NEGATIVE SARS-CoV-2 target nucleic acids are NOT DETECTED. The SARS-CoV-2 RNA is generally detectable in upper and lower  respiratory specimens during the acute phase of infection. The lowest  concentration of SARS-CoV-2 viral copies this assay can detect is 250  copies / mL. A negative result does not preclude SARS-CoV-2 infection  and should not be used as the sole basis for treatment or other  patient management decisions.  A negative result may occur with  improper specimen collection / handling, submission of specimen other  than nasopharyngeal swab, presence of viral mutation(s) within the  areas targeted by this assay, and inadequate number of viral copies  (<250 copies / mL). A negative result must be combined with clinical  observations, patient history, and epidemiological information. If result is POSITIVE SARS-CoV-2 target nucleic acids are DETECTED. The SARS-CoV-2 RNA is generally detectable in upper and lower  respiratory specimens dur ing the acute phase of infection.  Positive  results are indicative of active infection with SARS-CoV-2.  Clinical  correlation with patient history and  other diagnostic information is  necessary to determine patient infection status.  Positive results do  not rule out bacterial infection or co-infection with other viruses. If result is PRESUMPTIVE POSTIVE SARS-CoV-2 nucleic acids MAY BE PRESENT.   A presumptive positive result was obtained on the submitted specimen  and confirmed on repeat testing.  While 2019 novel coronavirus  (SARS-CoV-2) nucleic acids may be present in the submitted sample  additional confirmatory testing may be necessary for  epidemiological  and / or clinical management purposes  to differentiate between  SARS-CoV-2 and other Sarbecovirus currently known to infect humans.  If clinically indicated additional testing with an alternate test  methodology [phone]([id number]) is advised. The SARS-CoV-2 RNA is generally  detectable in upper and lower respiratory sp ecimens during the acute  phase of infection. The expected result is Negative. Fact Sheet for Patients:  BoilerBrush.com.cyhttps://www.fda.gov/media/136312/download Fact Sheet for Healthcare Providers: https://pope.com/https://www.fda.gov/media/136313/download This test is not yet approved or cleared by the [place] FDA and has been authorized for detection and/or diagnosis of SARS-CoV-2 by FDA under an Emergency Use Authorization (EUA).  This EUA will remain in effect (meaning this test can be used) for the duration of the COVID-19 declaration under Section 564(b)(1) of the Act, 21 U.S.C. section 360bbb-3(b)(1), unless the authorization is terminated or revoked sooner. Performed at [facility], [address] [place], [place] [address]   Surgical pcr screen     Status: None   Collection Time: 06/21/19 11:18 AM   Specimen: Nasal Mucosa; Nasal Swab  Result Value Ref Range Status   MRSA, PCR NEGATIVE NEGATIVE Final   Staphylococcus aureus NEGATIVE NEGATIVE Final    Comment: (NOTE) The Xpert SA Assay (FDA approved for NASAL specimens in patients 82 years of age and older), is one component of a comprehensive surveillance program. It is not intended to diagnose infection nor to guide or monitor treatment. Performed at [facility], [address] [place], [place] [address]     Sepsis Labs: Invalid input(s): PROCALCITONIN, LACTICIDVEN  Urine analysis: No results found for: COLORURINE, APPEARANCEUR, LABSPEC, PHURINE, GLUCOSEU, HGBUR, BILIRUBINUR, KETONESUR, PROTEINUR, UROBILINOGEN, NITRITE, LEUKOCYTESUR  Anemia Panel: No results for input(s): VITAMINB12,  FOLATE, FERRITIN, TIBC, IRON, RETICCTPCT in the last 72 hours.  Thyroid Function Tests: No results for input(s): TSH, T4TOTAL, FREET4, T3FREE, THYROIDAB in the last 72 hours.  Lipid Profile: No results for input(s): CHOL, HDL, LDLCALC, TRIG, CHOLHDL, LDLDIRECT in the last 72 hours.  CBG: Recent Labs  Lab 06/25/19 0754 06/25/19 1139 06/25/19 1649 06/25/19 1928 06/26/19 0814  GLUCAP 140* 135* 121* 145* 151*    HbA1C: No results for input(s): HGBA1C in the last 72 hours.  BNP (last 3 results): No results for input(s): PROBNP in the last 8760 hours.  Cardiac Enzymes: No results for input(s): CKTOTAL, CKMB, CKMBINDEX, TROPONINI in the last 168 hours.  Coagulation Profile: Recent Labs  Lab 06/21/19 0601 06/21/19 1402 06/22/19 0422 06/25/19 0317 06/26/19 0409  INR 1.9* 2.0* 2.0* 1.9* 1.8*    Liver Function Tests: Recent Labs  Lab 06/22/19 0422 06/23/19 0016  AST 18 16  ALT 14 13  ALKPHOS 84 77  BILITOT 1.0 1.0  PROT 6.6 6.3*  ALBUMIN 3.5 3.3*   No results for input(s): LIPASE, AMYLASE in the last 168 hours. No results for input(s): AMMONIA in the last 168 hours.  Basic Metabolic Panel: Recent Labs  Lab 06/21/19 0601 06/22/19 0422 06/23/19 0016 06/24/19 0655 06/25/19 0859  NA 140 137 135 139 139  K 4.1 3.9 3.2* 4.5 4.5  CL 106 101 98 101 105  CO2 23 24 25 26  21*  GLUCOSE 193* 171* 176* 198* 155*  BUN 25* 17 19 33* 46*  CREATININE 0.99 1.14* 1.27* 1.36* 1.41*  CALCIUM 9.2 9.3 8.7* 9.1 8.9   GFR: Estimated Creatinine Clearance: 28.9 mL/min (A) (by C-G formula based on SCr of 1.41 mg/dL (H)).  CBC: Recent Labs  Lab 06/21/19 0601 06/22/19 0422 06/23/19 0016 06/24/19 0710 06/25/19 0859  WBC 12.2* 9.6 10.8* 11.3* 10.7*  NEUTROABS 10.8*  --   --   --   --   HGB 12.7 11.4* 10.8* 9.9* 10.1*  HCT 39.6 34.5* 32.0* 30.5* 31.2*  MCV 94.3 91.8 90.7 94.1 94.8  PLT 214 210 205 205 217    Procedures:  ORIF of right bicondylar tibial plateau fracture  by Dr. Doreatha Martin on 06/23/2019  Microbiology: COVID-19 negative  Assessment & Plan: Fall at ALF-unknown mechanism Right bicondylar tibial plateau fracture -ORIF by Dr. Doreatha Martin on 06/23/2019 -Per Ortho, WBAT, as needed dressing change, PT/OT and pain control  Permanent A. Fib: Rate controlled with Cardizem and metoprolol.  On Coumadin for anticoagulation.  Risk and benefit of anticoagulation discussed with his POA/daughter by previous provider.  Reportedly only 2 falls in the last couple of years. -Continue home Cardizem and metoprolol for rate control -Continue Coumadin for anticoagulation.  INR 1.8 today. -Monitor PT/INR  Hypertension: Normotensive -Cardiac meds as above  IDDM-2: On 22 units of Lantus nightly at home.  CBG within fair range. -Check A1c -CBG monitoring, Lantus 12 units at bedtime, SSI -Continue low-dose gabapentin  COPD: Stable.  On room air. -Continue Breo Ellipta.  Acute blood loss anemia: Hgb 12.7 on admission.  Now 10.1.  Likely due to some blood loss from blood draws and surgery.  No obvious signs of bleeding. -We will continue monitoring  Unknown type CHF: Appears euvolemic.  No cardiopulmonary symptoms. -Continue home Lasix 40 mg twice daily -Continue home potassium supplements  Alzheimer's dementia without behavioral disturbance: Oriented to self only.  On Namenda and Aricept at home -Not sure the benefit of Namenda and Aricept at this time. -We will be cautious with nodal blocking agents.  Social/Ethics  Mood disorders: Stable -Continue home Zoloft, Remeron and tramadol.  DVT prophylaxis: On warfarin for anticoagulation Code Status: DNR/DNI  Family Communication: Attempted to call patient's daughter for update but no answer. Disposition Plan: Medically ready for discharge pending placement. Awaiting [facility]  to decide if they can accept patient back otherwise patient will have to go to SNF for rehab   Consultants: Orthopedic surgery   Antimicrobials: Anti-infectives (From admission, onward)   Start     Dose/Rate Route Frequency Ordered Stop   06/23/19 2000  ceFAZolin (ANCEF) IVPB 2g/100 mL premix     2 g 200 mL/hr over 30 Minutes Intravenous Every 8 hours 06/23/19 1347 06/24/19 1421   06/23/19 1158  vancomycin (VANCOCIN) powder  Status:  Discontinued       As needed 06/23/19 1159 06/23/19 1256      Sch Meds:  Scheduled Meds: . acetaminophen  650 mg Oral Q6H  . diltiazem  180 mg Oral Daily  . donepezil  10 mg Oral QHS  . ferrous sulfate  325 mg Oral BID WC  . fluticasone furoate-vilanterol  1 puff Inhalation Daily  . furosemide  40 mg Oral BID  . gabapentin  100 mg Oral TID  . insulin aspart  0-15 Units Subcutaneous TID WC  .  insulin aspart  0-5 Units Subcutaneous QHS  . insulin glargine  12 Units Subcutaneous QPM  . memantine  14 mg Oral QPM  . metoprolol tartrate  25 mg Oral BID  . mirtazapine  7.5 mg Oral QHS  . pantoprazole  40 mg Oral Daily  . potassium chloride SA  20 mEq Oral BID  . senna-docusate  2 tablet Oral BID  . sertraline  25 mg Oral Daily  . traZODone  50 mg Oral QHS  . warfarin  4 mg Oral ONCE-1800  . Warfarin - Pharmacist Dosing Inpatient   Does not apply q1800   Continuous Infusions: . sodium chloride 20 mL/hr at 06/24/19 0851   PRN Meds:.guaiFENesin, HYDROcodone-acetaminophen, HYDROmorphone (DILAUDID) injection, ipratropium-albuterol, liver oil-zinc oxide, ondansetron **OR** ondansetron (ZOFRAN) IV   Taye T. Gonfa [facility]  If 7PM-7AM, please contact night-coverage [email or web address] Password [email or web address] 06/26/2019, 11:53 AM

## 2019-06-26 NOTE — Progress Notes (Signed)
Subjective: 3 Days Post-Op Procedure(s) (LRB): OPEN REDUCTION INTERNAL FIXATION (ORIF) PROXIMAL TIBIA FRACTURE (Right) Patient reports pain as mild and moderate. Pt pleasantly confused.  Objective: Vital signs in last 24 hours: Temp:  [97.4 F (36.3 C)-98.7 F (37.1 C)] 97.9 F (36.6 C) (07/04 0913) Pulse Rate:  [68-134] 134 (07/04 0913) Resp:  [14-16] 16 (07/04 0343) BP: (96-113)/(40-99) 113/99 (07/04 0913) SpO2:  [93 %-94 %] 94 % (07/04 0913)  Intake/Output from previous day: 07/03 0701 - 07/04 0700 In: 560 [P.O.:560] Out: 1050 [Urine:1050] Intake/Output this shift: No intake/output data recorded.  Recent Labs    06/24/19 0710 06/25/19 0859  HGB 9.9* 10.1*   Recent Labs    06/24/19 0710 06/25/19 0859  WBC 11.3* 10.7*  RBC 3.24* 3.29*  HCT 30.5* 31.2*  PLT 205 217   Recent Labs    06/24/19 0655 06/25/19 0859  NA 139 139  K 4.5 4.5  CL 101 105  CO2 26 21*  BUN 33* 46*  CREATININE 1.36* 1.41*  GLUCOSE 198* 155*  CALCIUM 9.1 8.9   Recent Labs    06/25/19 0317 06/26/19 0409  INR 1.9* 1.8*    Sensation intact distally Intact pulses distally Dorsiflexion/Plantar flexion intact Incision: dressing C/D/I   Assessment/Plan: 3 Days Post-Op Procedure(s) (LRB): OPEN REDUCTION INTERNAL FIXATION (ORIF) PROXIMAL TIBIA FRACTURE (Right) Up with therapy WB RLE for transfers  dsg change prn Pain control as ordered D/c per med, may d/c from ortho standpoint   Chriss Czar 06/26/2019, 10:31 AM

## 2019-06-26 NOTE — Care Management (Signed)
Patient suffers from right tibial plateau fracture, dementia which impairs their ability to perform daily activities like toileting, dressing, feeding, bathing in the home. A walker will not resolve  issue with performing activities of daily living. A wheelchair will allow patient to safely perform daily activities. Patient is not able to propel themselves in the home using a standard weight wheelchair due to weakness. Patient can self propel in the lightweight wheelchair. Length of need lifetime.  Accessories: elevating leg rests (ELRs), wheel locks, extensions and anti-tippers, back cushion.   Patient also needs hospital bed as she is having great difficulty repositioning self in bed due to mental status and pain from leg fracture.  Patient requires a bed that can be elevated and lowered for providers to provide care and so that head of bed can be raised and lowered for repositioning needs.

## 2019-06-26 NOTE — Progress Notes (Signed)
Report called to [place] at [facility]. Patient given warfarin prior to discharge. Updated discharge summary faxed to [facility]  Massie Bougie, RN

## 2019-06-28 DIAGNOSIS — S8291XD Unspecified fracture of right lower leg, subsequent encounter for closed fracture with routine healing: Secondary | ICD-10-CM | POA: Diagnosis not present

## 2019-06-28 DIAGNOSIS — E0842 Diabetes mellitus due to underlying condition with diabetic polyneuropathy: Secondary | ICD-10-CM | POA: Diagnosis not present

## 2019-06-28 DIAGNOSIS — R269 Unspecified abnormalities of gait and mobility: Secondary | ICD-10-CM | POA: Diagnosis not present

## 2019-06-28 DIAGNOSIS — E669 Obesity, unspecified: Secondary | ICD-10-CM | POA: Diagnosis not present

## 2019-06-28 DIAGNOSIS — Z9181 History of falling: Secondary | ICD-10-CM | POA: Diagnosis not present

## 2019-07-05 DIAGNOSIS — I482 Chronic atrial fibrillation, unspecified: Secondary | ICD-10-CM | POA: Diagnosis not present

## 2019-07-05 DIAGNOSIS — F0390 Unspecified dementia without behavioral disturbance: Secondary | ICD-10-CM | POA: Diagnosis not present

## 2019-07-05 DIAGNOSIS — Z9181 History of falling: Secondary | ICD-10-CM | POA: Diagnosis not present

## 2019-07-05 DIAGNOSIS — S72001A Fracture of unspecified part of neck of right femur, initial encounter for closed fracture: Secondary | ICD-10-CM | POA: Diagnosis not present

## 2019-07-05 DIAGNOSIS — M6281 Muscle weakness (generalized): Secondary | ICD-10-CM | POA: Diagnosis not present

## 2019-07-05 DIAGNOSIS — N183 Chronic kidney disease, stage 3 (moderate): Secondary | ICD-10-CM | POA: Diagnosis not present

## 2019-07-05 DIAGNOSIS — R2681 Unsteadiness on feet: Secondary | ICD-10-CM | POA: Diagnosis not present

## 2019-07-05 DIAGNOSIS — S82141A Displaced bicondylar fracture of right tibia, initial encounter for closed fracture: Secondary | ICD-10-CM | POA: Diagnosis not present

## 2019-07-07 DIAGNOSIS — N183 Chronic kidney disease, stage 3 (moderate): Secondary | ICD-10-CM | POA: Diagnosis not present

## 2019-07-07 DIAGNOSIS — Z9181 History of falling: Secondary | ICD-10-CM | POA: Diagnosis not present

## 2019-07-07 DIAGNOSIS — R2681 Unsteadiness on feet: Secondary | ICD-10-CM | POA: Diagnosis not present

## 2019-07-07 DIAGNOSIS — M6281 Muscle weakness (generalized): Secondary | ICD-10-CM | POA: Diagnosis not present

## 2019-07-07 DIAGNOSIS — S82141A Displaced bicondylar fracture of right tibia, initial encounter for closed fracture: Secondary | ICD-10-CM | POA: Diagnosis not present

## 2019-07-07 DIAGNOSIS — S72001A Fracture of unspecified part of neck of right femur, initial encounter for closed fracture: Secondary | ICD-10-CM | POA: Diagnosis not present

## 2019-07-09 DIAGNOSIS — R2681 Unsteadiness on feet: Secondary | ICD-10-CM | POA: Diagnosis not present

## 2019-07-09 DIAGNOSIS — M6281 Muscle weakness (generalized): Secondary | ICD-10-CM | POA: Diagnosis not present

## 2019-07-09 DIAGNOSIS — S82141A Displaced bicondylar fracture of right tibia, initial encounter for closed fracture: Secondary | ICD-10-CM | POA: Diagnosis not present

## 2019-07-09 DIAGNOSIS — S72001A Fracture of unspecified part of neck of right femur, initial encounter for closed fracture: Secondary | ICD-10-CM | POA: Diagnosis not present

## 2019-07-09 DIAGNOSIS — N183 Chronic kidney disease, stage 3 (moderate): Secondary | ICD-10-CM | POA: Diagnosis not present

## 2019-07-09 DIAGNOSIS — Z9181 History of falling: Secondary | ICD-10-CM | POA: Diagnosis not present

## 2019-07-13 DIAGNOSIS — S82141D Displaced bicondylar fracture of right tibia, subsequent encounter for closed fracture with routine healing: Secondary | ICD-10-CM | POA: Diagnosis not present

## 2019-07-13 DIAGNOSIS — Z9181 History of falling: Secondary | ICD-10-CM | POA: Diagnosis not present

## 2019-07-13 DIAGNOSIS — M6281 Muscle weakness (generalized): Secondary | ICD-10-CM | POA: Diagnosis not present

## 2019-07-13 DIAGNOSIS — R2681 Unsteadiness on feet: Secondary | ICD-10-CM | POA: Diagnosis not present

## 2019-07-13 DIAGNOSIS — M79661 Pain in right lower leg: Secondary | ICD-10-CM | POA: Diagnosis not present

## 2019-07-13 DIAGNOSIS — E669 Obesity, unspecified: Secondary | ICD-10-CM | POA: Diagnosis not present

## 2019-07-13 DIAGNOSIS — S8291XD Unspecified fracture of right lower leg, subsequent encounter for closed fracture with routine healing: Secondary | ICD-10-CM | POA: Diagnosis not present

## 2019-07-13 DIAGNOSIS — R269 Unspecified abnormalities of gait and mobility: Secondary | ICD-10-CM | POA: Diagnosis not present

## 2019-07-13 DIAGNOSIS — N183 Chronic kidney disease, stage 3 (moderate): Secondary | ICD-10-CM | POA: Diagnosis not present

## 2019-07-13 DIAGNOSIS — S82141A Displaced bicondylar fracture of right tibia, initial encounter for closed fracture: Secondary | ICD-10-CM | POA: Diagnosis not present

## 2019-07-13 DIAGNOSIS — S72001A Fracture of unspecified part of neck of right femur, initial encounter for closed fracture: Secondary | ICD-10-CM | POA: Diagnosis not present

## 2019-07-16 DIAGNOSIS — S82141A Displaced bicondylar fracture of right tibia, initial encounter for closed fracture: Secondary | ICD-10-CM | POA: Diagnosis not present

## 2019-07-16 DIAGNOSIS — R2681 Unsteadiness on feet: Secondary | ICD-10-CM | POA: Diagnosis not present

## 2019-07-16 DIAGNOSIS — Z9181 History of falling: Secondary | ICD-10-CM | POA: Diagnosis not present

## 2019-07-16 DIAGNOSIS — N183 Chronic kidney disease, stage 3 (moderate): Secondary | ICD-10-CM | POA: Diagnosis not present

## 2019-07-16 DIAGNOSIS — M6281 Muscle weakness (generalized): Secondary | ICD-10-CM | POA: Diagnosis not present

## 2019-07-16 DIAGNOSIS — S72001A Fracture of unspecified part of neck of right femur, initial encounter for closed fracture: Secondary | ICD-10-CM | POA: Diagnosis not present

## 2019-07-17 DIAGNOSIS — M6281 Muscle weakness (generalized): Secondary | ICD-10-CM | POA: Diagnosis not present

## 2019-07-17 DIAGNOSIS — N183 Chronic kidney disease, stage 3 (moderate): Secondary | ICD-10-CM | POA: Diagnosis not present

## 2019-07-17 DIAGNOSIS — S82141A Displaced bicondylar fracture of right tibia, initial encounter for closed fracture: Secondary | ICD-10-CM | POA: Diagnosis not present

## 2019-07-17 DIAGNOSIS — R2681 Unsteadiness on feet: Secondary | ICD-10-CM | POA: Diagnosis not present

## 2019-07-17 DIAGNOSIS — Z9181 History of falling: Secondary | ICD-10-CM | POA: Diagnosis not present

## 2019-07-17 DIAGNOSIS — S72001A Fracture of unspecified part of neck of right femur, initial encounter for closed fracture: Secondary | ICD-10-CM | POA: Diagnosis not present

## 2019-07-19 ENCOUNTER — Other Ambulatory Visit: Payer: Self-pay

## 2019-07-19 ENCOUNTER — Encounter (HOSPITAL_COMMUNITY): Payer: Self-pay | Admitting: Obstetrics and Gynecology

## 2019-07-19 ENCOUNTER — Inpatient Hospital Stay (HOSPITAL_COMMUNITY): Payer: Medicare Other

## 2019-07-19 ENCOUNTER — Inpatient Hospital Stay (HOSPITAL_COMMUNITY)
Admission: EM | Admit: 2019-07-19 | Discharge: 2019-07-23 | DRG: 378 | Disposition: A | Discharge status: Hospice | Payer: Medicare Other | Attending: Internal Medicine | Admitting: Internal Medicine

## 2019-07-19 DIAGNOSIS — I959 Hypotension, unspecified: Secondary | ICD-10-CM | POA: Diagnosis not present

## 2019-07-19 DIAGNOSIS — Z882 Allergy status to sulfonamides status: Secondary | ICD-10-CM

## 2019-07-19 DIAGNOSIS — N170 Acute kidney failure with tubular necrosis: Secondary | ICD-10-CM | POA: Diagnosis not present

## 2019-07-19 DIAGNOSIS — I509 Heart failure, unspecified: Secondary | ICD-10-CM

## 2019-07-19 DIAGNOSIS — J449 Chronic obstructive pulmonary disease, unspecified: Secondary | ICD-10-CM | POA: Diagnosis present

## 2019-07-19 DIAGNOSIS — N179 Acute kidney failure, unspecified: Secondary | ICD-10-CM | POA: Diagnosis present

## 2019-07-19 DIAGNOSIS — I482 Chronic atrial fibrillation, unspecified: Secondary | ICD-10-CM | POA: Diagnosis present

## 2019-07-19 DIAGNOSIS — K5730 Diverticulosis of large intestine without perforation or abscess without bleeding: Secondary | ICD-10-CM | POA: Diagnosis present

## 2019-07-19 DIAGNOSIS — F0280 Dementia in other diseases classified elsewhere without behavioral disturbance: Secondary | ICD-10-CM | POA: Diagnosis present

## 2019-07-19 DIAGNOSIS — E1122 Type 2 diabetes mellitus with diabetic chronic kidney disease: Secondary | ICD-10-CM | POA: Diagnosis present

## 2019-07-19 DIAGNOSIS — D631 Anemia in chronic kidney disease: Secondary | ICD-10-CM | POA: Diagnosis present

## 2019-07-19 DIAGNOSIS — F05 Delirium due to known physiological condition: Secondary | ICD-10-CM | POA: Diagnosis present

## 2019-07-19 DIAGNOSIS — K625 Hemorrhage of anus and rectum: Secondary | ICD-10-CM

## 2019-07-19 DIAGNOSIS — R338 Other retention of urine: Secondary | ICD-10-CM | POA: Diagnosis not present

## 2019-07-19 DIAGNOSIS — I130 Hypertensive heart and chronic kidney disease with heart failure and stage 1 through stage 4 chronic kidney disease, or unspecified chronic kidney disease: Secondary | ICD-10-CM | POA: Diagnosis present

## 2019-07-19 DIAGNOSIS — I10 Essential (primary) hypertension: Secondary | ICD-10-CM | POA: Diagnosis not present

## 2019-07-19 DIAGNOSIS — N1330 Unspecified hydronephrosis: Secondary | ICD-10-CM | POA: Diagnosis not present

## 2019-07-19 DIAGNOSIS — Z7901 Long term (current) use of anticoagulants: Secondary | ICD-10-CM | POA: Diagnosis not present

## 2019-07-19 DIAGNOSIS — I4891 Unspecified atrial fibrillation: Secondary | ICD-10-CM | POA: Diagnosis not present

## 2019-07-19 DIAGNOSIS — Z515 Encounter for palliative care: Secondary | ICD-10-CM | POA: Diagnosis not present

## 2019-07-19 DIAGNOSIS — R4182 Altered mental status, unspecified: Secondary | ICD-10-CM | POA: Diagnosis not present

## 2019-07-19 DIAGNOSIS — N319 Neuromuscular dysfunction of bladder, unspecified: Secondary | ICD-10-CM | POA: Diagnosis present

## 2019-07-19 DIAGNOSIS — K922 Gastrointestinal hemorrhage, unspecified: Secondary | ICD-10-CM

## 2019-07-19 DIAGNOSIS — I340 Nonrheumatic mitral (valve) insufficiency: Secondary | ICD-10-CM | POA: Diagnosis not present

## 2019-07-19 DIAGNOSIS — E872 Acidosis: Secondary | ICD-10-CM | POA: Diagnosis present

## 2019-07-19 DIAGNOSIS — R451 Restlessness and agitation: Secondary | ICD-10-CM | POA: Diagnosis not present

## 2019-07-19 DIAGNOSIS — Z7189 Other specified counseling: Secondary | ICD-10-CM

## 2019-07-19 DIAGNOSIS — R404 Transient alteration of awareness: Secondary | ICD-10-CM | POA: Diagnosis not present

## 2019-07-19 DIAGNOSIS — T45515A Adverse effect of anticoagulants, initial encounter: Secondary | ICD-10-CM | POA: Diagnosis present

## 2019-07-19 DIAGNOSIS — R791 Abnormal coagulation profile: Secondary | ICD-10-CM | POA: Diagnosis not present

## 2019-07-19 DIAGNOSIS — Z888 Allergy status to other drugs, medicaments and biological substances status: Secondary | ICD-10-CM

## 2019-07-19 DIAGNOSIS — I361 Nonrheumatic tricuspid (valve) insufficiency: Secondary | ICD-10-CM | POA: Diagnosis not present

## 2019-07-19 DIAGNOSIS — Z66 Do not resuscitate: Secondary | ICD-10-CM | POA: Diagnosis present

## 2019-07-19 DIAGNOSIS — K921 Melena: Secondary | ICD-10-CM | POA: Diagnosis not present

## 2019-07-19 DIAGNOSIS — N183 Chronic kidney disease, stage 3 (moderate): Secondary | ICD-10-CM | POA: Diagnosis present

## 2019-07-19 DIAGNOSIS — Z87891 Personal history of nicotine dependence: Secondary | ICD-10-CM

## 2019-07-19 DIAGNOSIS — R339 Retention of urine, unspecified: Secondary | ICD-10-CM | POA: Diagnosis present

## 2019-07-19 DIAGNOSIS — F0281 Dementia in other diseases classified elsewhere with behavioral disturbance: Secondary | ICD-10-CM

## 2019-07-19 DIAGNOSIS — Z03818 Encounter for observation for suspected exposure to other biological agents ruled out: Secondary | ICD-10-CM | POA: Diagnosis not present

## 2019-07-19 DIAGNOSIS — Z20828 Contact with and (suspected) exposure to other viral communicable diseases: Secondary | ICD-10-CM | POA: Diagnosis present

## 2019-07-19 DIAGNOSIS — D62 Acute posthemorrhagic anemia: Secondary | ICD-10-CM | POA: Diagnosis present

## 2019-07-19 DIAGNOSIS — Z79899 Other long term (current) drug therapy: Secondary | ICD-10-CM

## 2019-07-19 DIAGNOSIS — K5900 Constipation, unspecified: Secondary | ICD-10-CM | POA: Diagnosis present

## 2019-07-19 DIAGNOSIS — G309 Alzheimer's disease, unspecified: Secondary | ICD-10-CM | POA: Diagnosis present

## 2019-07-19 DIAGNOSIS — R410 Disorientation, unspecified: Secondary | ICD-10-CM | POA: Diagnosis not present

## 2019-07-19 DIAGNOSIS — E875 Hyperkalemia: Secondary | ICD-10-CM | POA: Diagnosis present

## 2019-07-19 DIAGNOSIS — S82141D Displaced bicondylar fracture of right tibia, subsequent encounter for closed fracture with routine healing: Secondary | ICD-10-CM

## 2019-07-19 DIAGNOSIS — I5032 Chronic diastolic (congestive) heart failure: Secondary | ICD-10-CM | POA: Diagnosis present

## 2019-07-19 DIAGNOSIS — N189 Chronic kidney disease, unspecified: Secondary | ICD-10-CM

## 2019-07-19 DIAGNOSIS — G301 Alzheimer's disease with late onset: Secondary | ICD-10-CM | POA: Diagnosis not present

## 2019-07-19 DIAGNOSIS — L89302 Pressure ulcer of unspecified buttock, stage 2: Secondary | ICD-10-CM | POA: Diagnosis not present

## 2019-07-19 DIAGNOSIS — Z794 Long term (current) use of insulin: Secondary | ICD-10-CM

## 2019-07-19 DIAGNOSIS — R58 Hemorrhage, not elsewhere classified: Secondary | ICD-10-CM | POA: Diagnosis not present

## 2019-07-19 DIAGNOSIS — M2550 Pain in unspecified joint: Secondary | ICD-10-CM | POA: Diagnosis not present

## 2019-07-19 DIAGNOSIS — E1129 Type 2 diabetes mellitus with other diabetic kidney complication: Secondary | ICD-10-CM | POA: Diagnosis not present

## 2019-07-19 DIAGNOSIS — Z7401 Bed confinement status: Secondary | ICD-10-CM | POA: Diagnosis not present

## 2019-07-19 LAB — TYPE AND SCREEN
ABO/RH(D): O POS
Antibody Screen: NEGATIVE

## 2019-07-19 LAB — COMPREHENSIVE METABOLIC PANEL
ALT: 10 U/L (ref 0–44)
AST: 18 U/L (ref 15–41)
Albumin: 3.7 g/dL (ref 3.5–5.0)
Alkaline Phosphatase: 117 U/L (ref 38–126)
Anion gap: 10 (ref 5–15)
BUN: 94 mg/dL — ABNORMAL HIGH (ref 8–23)
CO2: 17 mmol/L — ABNORMAL LOW (ref 22–32)
Calcium: 9.2 mg/dL (ref 8.9–10.3)
Chloride: 107 mmol/L (ref 98–111)
Creatinine, Ser: 4.38 mg/dL — ABNORMAL HIGH (ref 0.44–1.00)
GFR calc Af Amer: 10 mL/min — ABNORMAL LOW (ref >60)
GFR calc non Af Amer: 9 mL/min — ABNORMAL LOW (ref >60)
Glucose, Bld: 111 mg/dL — ABNORMAL HIGH (ref 70–99)
Potassium: >7.5 mmol/L (ref 3.5–5.1)
Sodium: 134 mmol/L — ABNORMAL LOW (ref 135–145)
Total Bilirubin: 0.7 mg/dL (ref 0.3–1.2)
Total Protein: 7.8 g/dL (ref 6.5–8.1)

## 2019-07-19 LAB — CBC
HCT: 35.1 % — ABNORMAL LOW (ref 36.0–46.0)
Hemoglobin: 10.7 g/dL — ABNORMAL LOW (ref 12.0–15.0)
MCH: 29.6 pg (ref 26.0–34.0)
MCHC: 30.5 g/dL (ref 30.0–36.0)
MCV: 97.2 fL (ref 80.0–100.0)
Platelets: 275 10*3/uL (ref 150–400)
RBC: 3.61 MIL/uL — ABNORMAL LOW (ref 3.87–5.11)
RDW: 15 % (ref 11.5–15.5)
WBC: 8.9 10*3/uL (ref 4.0–10.5)
nRBC: 0 % (ref 0.0–0.2)

## 2019-07-19 LAB — BASIC METABOLIC PANEL
Anion gap: 11 (ref 5–15)
BUN: 83 mg/dL — ABNORMAL HIGH (ref 8–23)
CO2: 14 mmol/L — ABNORMAL LOW (ref 22–32)
Calcium: 9.2 mg/dL (ref 8.9–10.3)
Chloride: 109 mmol/L (ref 98–111)
Creatinine, Ser: 3.94 mg/dL — ABNORMAL HIGH (ref 0.44–1.00)
GFR calc Af Amer: 12 mL/min — ABNORMAL LOW (ref >60)
GFR calc non Af Amer: 10 mL/min — ABNORMAL LOW (ref >60)
Glucose, Bld: 132 mg/dL — ABNORMAL HIGH (ref 70–99)
Potassium: 6.3 mmol/L (ref 3.5–5.1)
Sodium: 134 mmol/L — ABNORMAL LOW (ref 135–145)

## 2019-07-19 LAB — URINALYSIS, ROUTINE W REFLEX MICROSCOPIC
Bilirubin Urine: NEGATIVE
Glucose, UA: NEGATIVE mg/dL
Ketones, ur: NEGATIVE mg/dL
Nitrite: NEGATIVE
Protein, ur: NEGATIVE mg/dL
Specific Gravity, Urine: 1.013 (ref 1.005–1.030)
pH: 5 (ref 5.0–8.0)

## 2019-07-19 LAB — SARS CORONAVIRUS 2 BY RT PCR (HOSPITAL ORDER, PERFORMED IN ~~LOC~~ HOSPITAL LAB): SARS Coronavirus 2: NEGATIVE

## 2019-07-19 LAB — POTASSIUM: Potassium: 6.9 mmol/L (ref 3.5–5.1)

## 2019-07-19 LAB — PROTIME-INR
INR: >10 (ref 0.8–1.2)
Prothrombin Time: >90 seconds — ABNORMAL HIGH (ref 11.4–15.2)

## 2019-07-19 LAB — POC OCCULT BLOOD, ED: Fecal Occult Bld: POSITIVE — AB

## 2019-07-19 LAB — MAGNESIUM: Magnesium: 2.2 mg/dL (ref 1.7–2.4)

## 2019-07-19 LAB — MRSA PCR SCREENING: MRSA by PCR: NEGATIVE

## 2019-07-19 LAB — GLUCOSE, RANDOM: Glucose, Bld: 89 mg/dL (ref 70–99)

## 2019-07-19 MED ORDER — TRAZODONE HCL 50 MG PO TABS
50.0000 mg | ORAL_TABLET | Freq: Every day | ORAL | Status: DC
  Administered 2019-07-19 – 2019-07-22 (×4): 50 mg via ORAL
  Filled 2019-07-19 (×4): qty 1

## 2019-07-19 MED ORDER — CAMPHOR-MENTHOL 0.5-0.5 % EX LOTN: TOPICAL_LOTION | Freq: Three times a day (TID) | CUTANEOUS | Status: DC | PRN

## 2019-07-19 MED ORDER — LORAZEPAM 2 MG/ML IJ SOLN
1.0000 mg | Freq: Once | INTRAMUSCULAR | Status: AC
  Administered 2019-07-19: 22:00:00 1 mg via INTRAVENOUS
  Filled 2019-07-19: qty 1

## 2019-07-19 MED ORDER — IPRATROPIUM-ALBUTEROL 0.5-2.5 (3) MG/3ML IN SOLN: 3.0000 mL | RESPIRATORY_TRACT | Status: DC | PRN

## 2019-07-19 MED ORDER — SODIUM CHLORIDE 0.9 % IV BOLUS
1000.0000 mL | Freq: Once | INTRAVENOUS | Status: AC
  Administered 2019-07-19: 1000 mL via INTRAVENOUS

## 2019-07-19 MED ORDER — SENNOSIDES-DOCUSATE SODIUM 8.6-50 MG PO TABS: 1.0000 | ORAL_TABLET | Freq: Every evening | ORAL | Status: DC | PRN

## 2019-07-19 MED ORDER — DILTIAZEM HCL ER COATED BEADS 120 MG PO CP24
120.0000 mg | ORAL_CAPSULE | Freq: Every day | ORAL | Status: DC
  Administered 2019-07-20 – 2019-07-23 (×4): 120 mg via ORAL
  Filled 2019-07-19 (×4): qty 1

## 2019-07-19 MED ORDER — FERROUS SULFATE 325 (65 FE) MG PO TABS
325.0000 mg | ORAL_TABLET | Freq: Two times a day (BID) | ORAL | Status: DC
  Administered 2019-07-20 – 2019-07-23 (×7): 325 mg via ORAL
  Filled 2019-07-19 (×7): qty 1

## 2019-07-19 MED ORDER — CHLORHEXIDINE GLUCONATE CLOTH 2 % EX PADS
6.0000 | MEDICATED_PAD | Freq: Every day | CUTANEOUS | Status: DC
  Administered 2019-07-20 – 2019-07-23 (×5): 6 via TOPICAL

## 2019-07-19 MED ORDER — HYDROCODONE-ACETAMINOPHEN 5-325 MG PO TABS
1.0000 | ORAL_TABLET | Freq: Four times a day (QID) | ORAL | Status: DC | PRN
  Administered 2019-07-19 – 2019-07-20 (×2): 1 via ORAL
  Filled 2019-07-19 (×2): qty 1

## 2019-07-19 MED ORDER — VITAMIN K1 10 MG/ML IJ SOLN
10.0000 mg | Freq: Once | INTRAVENOUS | Status: AC
  Administered 2019-07-19: 19:00:00 10 mg via INTRAVENOUS
  Filled 2019-07-19 (×2): qty 1

## 2019-07-19 MED ORDER — NYSTATIN 100000 UNIT/GM EX POWD
Freq: Two times a day (BID) | CUTANEOUS | Status: DC
  Administered 2019-07-19 – 2019-07-23 (×7): via TOPICAL
  Filled 2019-07-19: qty 15

## 2019-07-19 MED ORDER — ONDANSETRON HCL 4 MG PO TABS: 4.0000 mg | ORAL_TABLET | Freq: Four times a day (QID) | ORAL | Status: DC | PRN

## 2019-07-19 MED ORDER — CALCIUM GLUCONATE-NACL 1-0.675 GM/50ML-% IV SOLN
1.0000 g | Freq: Once | INTRAVENOUS | Status: AC
  Administered 2019-07-19: 21:00:00 1000 mg via INTRAVENOUS
  Filled 2019-07-19: qty 50

## 2019-07-19 MED ORDER — CHLOROXYLENOL-ZINC OXIDE EX KIT: PACK | CUTANEOUS | Status: DC | PRN

## 2019-07-19 MED ORDER — DEXTROSE 50 % IV SOLN
1.0000 | Freq: Once | INTRAVENOUS | Status: AC
  Administered 2019-07-19: 17:00:00 50 mL via INTRAVENOUS
  Filled 2019-07-19: qty 50

## 2019-07-19 MED ORDER — VITAMIN D3 25 MCG (1000 UNIT) PO TABS
2000.0000 [IU] | ORAL_TABLET | Freq: Every day | ORAL | Status: DC
  Administered 2019-07-19 – 2019-07-23 (×5): 2000 [IU] via ORAL
  Filled 2019-07-19 (×5): qty 2

## 2019-07-19 MED ORDER — PANTOPRAZOLE SODIUM 40 MG PO TBEC
40.0000 mg | DELAYED_RELEASE_TABLET | Freq: Every day | ORAL | Status: DC
  Administered 2019-07-20 – 2019-07-23 (×4): 40 mg via ORAL
  Filled 2019-07-19 (×4): qty 1

## 2019-07-19 MED ORDER — ONDANSETRON HCL 4 MG/2ML IJ SOLN: 4.0000 mg | Freq: Four times a day (QID) | INTRAMUSCULAR | Status: DC | PRN

## 2019-07-19 MED ORDER — ZINC OXIDE 40 % EX OINT
TOPICAL_OINTMENT | CUTANEOUS | Status: DC | PRN
  Administered 2019-07-22: 06:00:00 via TOPICAL
  Filled 2019-07-19: qty 57

## 2019-07-19 MED ORDER — INSULIN ASPART 100 UNIT/ML IV SOLN
10.0000 [IU] | Freq: Once | INTRAVENOUS | Status: AC
  Administered 2019-07-19: 21:00:00 10 [IU] via INTRAVENOUS

## 2019-07-19 MED ORDER — MEMANTINE HCL ER 14 MG PO CP24
14.0000 mg | ORAL_CAPSULE | Freq: Every evening | ORAL | Status: DC
  Administered 2019-07-19 – 2019-07-22 (×4): 14 mg via ORAL
  Filled 2019-07-19 (×6): qty 1

## 2019-07-19 MED ORDER — MENTHOL-ZINC OXIDE 0.44-20.6 % EX OINT: 1.0000 "application " | TOPICAL_OINTMENT | Freq: Three times a day (TID) | CUTANEOUS | Status: DC | PRN

## 2019-07-19 MED ORDER — DEXTROSE 50 % IV SOLN
1.0000 | Freq: Once | INTRAVENOUS | Status: AC
  Administered 2019-07-19: 21:00:00 50 mL via INTRAVENOUS
  Filled 2019-07-19: qty 50

## 2019-07-19 MED ORDER — CALCIUM GLUCONATE-NACL 1-0.675 GM/50ML-% IV SOLN
1.0000 g | Freq: Once | INTRAVENOUS | Status: AC
  Administered 2019-07-19: 17:00:00 1000 mg via INTRAVENOUS
  Filled 2019-07-19: qty 50

## 2019-07-19 MED ORDER — BISACODYL 10 MG RE SUPP: 10.0000 mg | Freq: Every day | RECTAL | Status: DC | PRN

## 2019-07-19 MED ORDER — DOCUSATE SODIUM 100 MG PO CAPS
100.0000 mg | ORAL_CAPSULE | Freq: Two times a day (BID) | ORAL | Status: DC
  Administered 2019-07-19 – 2019-07-22 (×4): 100 mg via ORAL
  Filled 2019-07-19 (×7): qty 1

## 2019-07-19 MED ORDER — INSULIN GLARGINE 100 UNIT/ML ~~LOC~~ SOLN
10.0000 [IU] | Freq: Every day | SUBCUTANEOUS | Status: DC
  Administered 2019-07-20: 10 [IU] via SUBCUTANEOUS
  Filled 2019-07-19: qty 0.1

## 2019-07-19 MED ORDER — INSULIN ASPART 100 UNIT/ML IV SOLN
5.0000 [IU] | Freq: Once | INTRAVENOUS | Status: AC
  Administered 2019-07-19: 17:00:00 5 [IU] via INTRAVENOUS
  Filled 2019-07-19: qty 0.05

## 2019-07-19 MED ORDER — FLUTICASONE FUROATE-VILANTEROL 100-25 MCG/INH IN AEPB
1.0000 | INHALATION_SPRAY | Freq: Every day | RESPIRATORY_TRACT | Status: DC
  Administered 2019-07-21: 1 via RESPIRATORY_TRACT
  Filled 2019-07-19: qty 28

## 2019-07-19 MED ORDER — SODIUM ZIRCONIUM CYCLOSILICATE 10 G PO PACK
10.0000 g | PACK | Freq: Three times a day (TID) | ORAL | Status: DC
  Administered 2019-07-20 (×3): 10 g via ORAL
  Filled 2019-07-19 (×7): qty 1

## 2019-07-19 MED ORDER — STERILE WATER FOR INJECTION IV SOLN
Freq: Once | INTRAVENOUS | Status: AC
  Administered 2019-07-19: 21:00:00 via INTRAVENOUS
  Filled 2019-07-19: qty 9.71

## 2019-07-19 MED ORDER — CALCIUM GLUCONATE 10 % IV SOLN: 1.0000 g | Freq: Once | INTRAVENOUS | Status: DC

## 2019-07-19 MED ORDER — METOPROLOL TARTRATE 25 MG PO TABS
25.0000 mg | ORAL_TABLET | Freq: Two times a day (BID) | ORAL | Status: DC
  Administered 2019-07-19 – 2019-07-23 (×7): 25 mg via ORAL
  Filled 2019-07-19 (×8): qty 1

## 2019-07-19 MED ORDER — SODIUM CHLORIDE 0.9 % IV SOLN: 10.0000 mL/h | Freq: Once | INTRAVENOUS | Status: DC

## 2019-07-19 NOTE — Progress Notes (Signed)
CRITICAL VALUE ALERT  Critical Value:  Potassium 6.9 with no hemolysis  Date & Time Notied:  07/19/19 ans 1935  Provider Notified: Lamar Blinks  Orders Received/Actions taken: awaiting orders

## 2019-07-19 NOTE — Consult Note (Signed)
Reason for Consult: Renal failure Referring Physician:  Dr. Fran LowesAva Zamora  Chief Complaint:  BRBPR  Assessment/Plan: 1. Renal failure - She is not cooperating and difficult to auscultate but no rales, no JVD and only edema is in the rt leg where she had a tibial plateau fracture.  - Will send urine studies but she appears to be intravascularly dry; agree with isotonoic fluids which will also help with distal Na/K exchange. Already has gotten protocol and only has peaked t-waves, no widening of the QRS.  - Will check renal ultrasound to rule out obstructive etiology (lower on the differential) - Lokelma 10gm q8hrs; does not appear to be overloaded. 2. Hyperkalemia - secondary to AKI + GIB --> treated per protocol; if no response or if there is progression then may consider iHD, but would like to avoid especially given her advanced dementia. 3. HTN - Controlled 4. Afib - had been on Coumadin but hypertherapeutic. 5. DM 6. CHF - had been on Lasix + KDur - Would hold for now. 7. Alzheimer's dementia 8. Recent rt tibial plateau fracture   HPI: Grace ReichertMary Zamora is an 84 y.o. female with h/o dementia, DM, Afib, CHF on Coumadin therapy w/ recent rt tibial plateau closed bicondylar fracture being admitted from the [facility] with BRBPR noted in her diaper. SHe was FOBT+ in the ED with  Hb 10.7 and K>7.5 with peaked t-waves and BUN/Cr of 94/4.38 and an INR of >10. BL cr is 1-1.3. No fever noted in the ED and BP was 105-122/58-68 with HR in the 70's, SARS CoV-2 swab pending. Pt given VitK and FFP, D50 + Insulin, CaGluconate and IVF. No h/o obstruction or dysuria but ROS completely not dependable given the pt's advanced dementia. She had also been treated with Lasix and KDur for CHF.  ROS Review of systems not obtained due to patient factors.  Chemistry and CBC: Creatinine, Ser  Date/Time Value Ref Range Status  07/19/2019 02:44 PM 4.38 (H) 0.44 - 1.00 mg/dL Final  81/19/147807/02/2019  29:5608:59 AM 1.41 (H) 0.44 - 1.00 mg/dL Final  21/30/865707/01/2019 84:6906:55 AM 1.36 (H) 0.44 - 1.00 mg/dL Final  62/95/284107/12/2018 32:4412:16 AM 1.27 (H) 0.44 - 1.00 mg/dL Final  01/02/725306/30/2020 66:4404:22 AM 1.14 (H) 0.44 - 1.00 mg/dL Final  03/47/425906/29/2020 56:3806:01 AM 0.99 0.44 - 1.00 mg/dL Final  75/64/332905/02/2019 51:8808:41 AM 1.21 (H) 0.44 - 1.00 mg/dL Final   Recent Labs  Lab 07/19/19 1444  NA 134*  K >7.5*  CL 107  CO2 17*  GLUCOSE 111*  BUN 94*  CREATININE 4.38*  CALCIUM 9.2   Recent Labs  Lab 07/19/19 1444  WBC 8.9  HGB 10.7*  HCT 35.1*  MCV 97.2  PLT 275   Liver Function Tests: Recent Labs  Lab 07/19/19 1444  AST 18  ALT 10  ALKPHOS 117  BILITOT 0.7  PROT 7.8  ALBUMIN 3.7   No results for input(s): LIPASE, AMYLASE in the last 168 hours. No results for input(s): AMMONIA in the last 168 hours. Cardiac Enzymes: No results for input(s): CKTOTAL, CKMB, CKMBINDEX, TROPONINI in the last 168 hours. Iron Studies: No results for input(s): IRON, TIBC, TRANSFERRIN, FERRITIN in the last 72 hours. PT/INR: @LABRCNTIP (inr:5)  Xrays/Other Studies: ) Results for orders placed or performed during the hospital encounter of 07/19/19 (from the past 48 hour(s))  Comprehensive metabolic panel     Status: Abnormal   Collection Time: 07/19/19  2:44 PM  Result Value Ref Range   Sodium 134 (L)  135 - 145 mmol/L   Potassium >7.5 (HH) 3.5 - 5.1 mmol/L    Comment: NO VISIBLE HEMOLYSIS CRITICAL RESULT CALLED TO, READ BACK BY AND VERIFIED WITH: Carleene CooperSHEILA BINGHAM,RN [id number] @ 1624 BY J SCOTTON    Chloride 107 98 - 111 mmol/L   CO2 17 (L) 22 - 32 mmol/L   Glucose, Bld 111 (H) 70 - 99 mg/dL   BUN 94 (H) 8 - 23 mg/dL   Creatinine, Ser 7.824.38 (H) 0.44 - 1.00 mg/dL   Calcium 9.2 8.9 - 95.610.3 mg/dL   Total Protein 7.8 6.5 - 8.1 g/dL   Albumin 3.7 3.5 - 5.0 g/dL   AST 18 15 - 41 U/L   ALT 10 0 - 44 U/L   Alkaline Phosphatase 117 38 - 126 U/L   Total Bilirubin 0.7 0.3 - 1.2 mg/dL   GFR calc non Af Amer 9 (L) >60 mL/min   GFR calc Af Amer 10  (L) >60 mL/min   Anion gap 10 5 - 15    Comment: Performed at [facility], [address] [place], [place] [address]  CBC     Status: Abnormal   Collection Time: 07/19/19  2:44 PM  Result Value Ref Range   WBC 8.9 4.0 - 10.5 K/uL   RBC 3.61 (L) 3.87 - 5.11 MIL/uL   Hemoglobin 10.7 (L) 12.0 - 15.0 g/dL   HCT 65.735.1 (L) 84.636.0 - 96.246.0 %   MCV 97.2 80.0 - 100.0 fL   MCH 29.6 26.0 - 34.0 pg   MCHC 30.5 30.0 - 36.0 g/dL   RDW 95.215.0 84.111.5 - 32.415.5 %   Platelets 275 150 - 400 K/uL   nRBC 0.0 0.0 - 0.2 %    Comment: Performed at [facility], [address] [place], [place] [address]  Protime-INR     Status: Abnormal   Collection Time: 07/19/19  3:04 PM  Result Value Ref Range   Prothrombin Time >90.0 (H) 11.4 - 15.2 seconds   INR >10.0 (HH) 0.8 - 1.2    Comment: CRITICAL RESULT CALLED TO, READ BACK BY AND VERIFIED WITH: ZULETA,C. RN @1638  ON 07.27.2020 BY COHEN,K (NOTE) INR goal varies based on device and disease states. Performed at [facility], [address] [place], [place] [address]   Urinalysis, Routine w reflex microscopic     Status: Abnormal   Collection Time: 07/19/19  3:15 PM  Result Value Ref Range   Color, Urine YELLOW YELLOW   APPearance HAZY (A) CLEAR   Specific Gravity, Urine 1.013 1.005 - 1.030   pH 5.0 5.0 - 8.0   Glucose, UA NEGATIVE NEGATIVE mg/dL   Hgb urine dipstick LARGE (A) NEGATIVE   Bilirubin Urine NEGATIVE NEGATIVE   Ketones, ur NEGATIVE NEGATIVE mg/dL   Protein, ur NEGATIVE NEGATIVE mg/dL   Nitrite NEGATIVE NEGATIVE   Leukocytes,Ua LARGE (A) NEGATIVE   RBC / HPF 6-10 0 - 5 RBC/hpf   WBC, UA 11-20 0 - 5 WBC/hpf   Bacteria, UA RARE (A) NONE SEEN   Squamous Epithelial / LPF 6-10 0 - 5   WBC Clumps PRESENT    Mucus PRESENT    Hyaline Casts, UA PRESENT    Non Squamous Epithelial 0-5 (A) NONE SEEN    Comment: Performed at [facility], [address] [place], [place] [address]   POC occult blood, ED     Status: Abnormal   Collection Time: 07/19/19  4:02 PM  Result Value Ref  Range   Fecal Occult Bld POSITIVE (A) NEGATIVE   No results found.  PMH:   Past Medical History:  Diagnosis Date  . Atrial fibrillation ([place])   . CHF (congestive heart failure) ([place])   . Diabetes mellitus without complication (HCC)     PSH:   Past Surgical History:  Procedure Laterality Date  . ORIF TIBIA FRACTURE Right 06/23/2019   Procedure: OPEN REDUCTION INTERNAL FIXATION (ORIF) PROXIMAL TIBIA FRACTURE;  Surgeon: Shona Needles, MD;  Location: [place];  Service: Orthopedics;  Laterality: Right;    Allergies:  Allergies  Allergen Reactions  . Lisinopril   . Niacin And Related   . Sulfa Antibiotics     Medications:   Prior to Admission medications   Medication Sig Start Date End Date Taking? Authorizing Provider  acetaminophen (TYLENOL) 500 MG tablet Take 500 mg by mouth 3 (three) times daily.    [provider]  BREO ELLIPTA 100-25 MCG/INH AEPB Inhale 1 puff into the lungs daily. Rinse mouth after use 04/11/19   [provider]  Chloroxylenol-Zinc Oxide (BAZA EX) Apply 1 application topically as needed. Apply after brief chance    [provider]  cholecalciferol (VITAMIN D) 25 MCG (1000 UT) tablet Take 2,000 Units by mouth daily.    [provider]  diltiazem (CARDIZEM CD) 120 MG 24 hr capsule Take 120 mg by mouth daily. 04/17/19   [provider]  donepezil (ARICEPT) 10 MG tablet Take 10 mg by mouth daily. 04/11/19   [provider]  ferrous sulfate 325 (65 FE) MG tablet Take 325 mg by mouth 2 (two) times daily with a meal.    [provider]  furosemide (LASIX) 40 MG tablet Take 40 mg by mouth 2 (two) times a day. 04/07/19   [provider]  gabapentin (NEURONTIN) 100 MG capsule Take 100 mg by mouth 2 (two) times a day.  04/21/19   [provider]  HYDROcodone-acetaminophen (NORCO/VICODIN) 5-325 MG  tablet Take 1 tablet by mouth every 6 (six) hours as needed for moderate pain. 06/24/19   Delray Alt, PA-C  ipratropium-albuterol (DUONEB) 0.5-2.5 (3) MG/3ML SOLN Take 3 mLs by nebulization every 2 (two) hours as needed (Shortness of breath).    [provider]  LANTUS SOLOSTAR 100 UNIT/ML Solostar Pen Inject 18 Units into the skin daily. 06/26/19   Mercy Riding, MD  memantine (NAMENDA XR) 14 MG CP24 24 hr capsule Take 14 mg by mouth every evening.    [provider]  Menthol-Zinc Oxide (CALMOSEPTINE) 0.44-20.6 % OINT Apply 1 application topically every 8 (eight) hours as needed (itching).    [provider]  metoprolol tartrate (LOPRESSOR) 25 MG tablet Take 25 mg by mouth 2 (two) times a day. 04/17/19   [provider]  mirtazapine (REMERON) 15 MG tablet Take 7.5 mg by mouth at bedtime.  03/01/19   [provider]  NYSTATIN powder Apply 1 ampule topically daily. 04/12/19   [provider]  omeprazole (PRILOSEC) 20 MG capsule Take 20 mg by mouth daily. 04/14/19   [provider]  potassium chloride SA (K-DUR) 20 MEQ tablet Take 20 mEq by mouth 2 (two) times daily.  04/17/19   [provider]  Psyllium 400 MG CAPS Take 400 mg by mouth at bedtime.    [provider]  senna-docusate (SENOKOT-S) 8.6-50 MG tablet Take 1 tablet by mouth 3 (three) times a week. MWF    [provider]  sertraline (ZOLOFT)  25 MG tablet Take 25 mg by mouth daily. 04/07/19   [provider]  traZODone (DESYREL) 50 MG tablet Take 50 mg by mouth at bedtime. 04/17/19   [provider]  trolamine salicylate (ASPERCREME) 10 % cream Apply 1 application topically 2 (two) times a day.     [provider]  warfarin (COUMADIN) 4 MG tablet Take 1 tablet (4 mg total) by mouth daily at 6 PM for 30 doses. 06/27/19 07/27/19  Almon HerculesGonfa, Taye T, MD    Discontinued Meds:   Medications Discontinued During This Encounter  Medication Reason   . calcium gluconate inj 10% (1 g) URGENT USE ONLY! Reorder    Social History:  reports that she has quit smoking. She has never used smokeless tobacco. She reports previous alcohol use. She reports previous drug use.  Family History:  History reviewed. No pertinent family history.  Blood pressure (!) 105/58, pulse 83, temperature 97.9 F (36.6 C), temperature source Oral, resp. rate 17, SpO2 98 %. General appearance: distracted and no distress Head: Normocephalic, without obvious abnormality, atraumatic Eyes: negative Neck: no adenopathy, no carotid bruit, no JVD, supple, symmetrical, trachea midline and thyroid not enlarged, symmetric, no tenderness/mass/nodules Back: symmetric, no curvature. ROM normal. No CVA tenderness. Resp: clear to auscultation bilaterally and Poor effort and non cooperating Chest wall: no tenderness Cardio: S1, S2 normal GI: soft, non-tender; bowel sounds normal; no masses,  no organomegaly Extremities: edema RLE edema and rt knee swelling Skin: Ecchymosis over rt leg       Elycia Woodside, Len BlalockJAMES W, MD 07/19/2019, 5:53 PM

## 2019-07-19 NOTE — ED Triage Notes (Signed)
Per EMS: Pt reportedly is having rectal bleeding. Pt is demented and cannot answer any questions appropriately and is combative with staff

## 2019-07-19 NOTE — H&P (Addendum)
Grace ReichertMary Zamora is an 84 y.o. female.   Chief Complaint: BRBPR HPI:The patient is a 84 yr old woman who carries a past medical history for dementia, DM II, atrial fibrillation, and CHF. She was recently discharged from [facility] on 06/26/2019 after an admission for a closed bicondylar fracture of the right tibial Plateau. [facility].    The patient was being cleaned up today when it was noted that she had blood in her diaper. She was sent to the ED.  In the ED the patient was found to be agitated and combative in the ED. Temp was 97.9, RR 17, Blood pressure 105/58. She is saturating 98% on room air.   Urinalysis: large LES, FOBT positive, Hemoglobin 10.7 (10.3 on discharge 7.4), potassium is 7.5, CO2 17, Creatinine 4.38, WBC is 8.9, PT> 90, INR>10. EKG has peaked T waves.  TRH was consulted to admit the patient for further evaluation and care. Nephrology was consulted from the ED.  In the ED the patient was given 10 mg of Vitamin K IV and FFP. As well as D 50 followed by insulin and calcium gluconate.   Past Medical History:  Diagnosis Date  . Atrial fibrillation (HCC)   . CHF (congestive heart failure) (HCC)   . Diabetes mellitus without complication [facility](HCC)     Past Surgical History:  Procedure Laterality Date  . ORIF TIBIA FRACTURE Right 06/23/2019   Procedure: OPEN REDUCTION INTERNAL FIXATION (ORIF) PROXIMAL TIBIA FRACTURE;  Surgeon: Roby LoftsHaddix, Kevin P, MD;  Location: [facility];  Service: Orthopedics;  Laterality: Right;    History reviewed. No pertinent family history. Social History:  reports that she has quit smoking. She has never used smokeless tobacco. She reports previous alcohol use. She reports previous drug use. (Not in a hospital admission)   Allergies:  Allergies  Allergen Reactions  . Lisinopril   . Niacin And Related   . Sulfa Antibiotics     Review of systems not obtained due to patient factors. Due to patient's mental  status, dementia, and agitation.   General appearance: combative, moderately obese and uncooperative Head: Normocephalic, without obvious abnormality, atraumatic Eyes: PERRLA, No scleral icterus or injection Throat: lips, mucosa, and tongue normal; teeth and gums normal Neck: no adenopathy, no carotid bruit, no JVD, supple, symmetrical, trachea midline and thyroid not enlarged, symmetric, no tenderness/mass/nodules Resp: No increased work of breathing. No wheezes, rales, or rhonchi. No tactile fremitus. Chest wall: no tenderness Cardio: regular rate and rhythm, S1, S2 normal, no murmur, click, rub or gallop GI: soft, non-tender; bowel sounds normal; no masses,  no organomegaly Extremities: extremities normal, atraumatic, no cyanosis or edemathere is some edema of left lower extremity. Pulses: Diminished distal pulses bilaterally. Skin: Skin color, texture, turgor normal. No rashes or lesions Lymph nodes: Cervical, supraclavicular, and axillary nodes normal. Neurologic: Grossly normal  Results for orders placed or performed during the hospital encounter of 07/19/19 (from the past 48 hour(s))  Comprehensive metabolic panel     Status: Abnormal   Collection Time: 07/19/19  2:44 PM  Result Value Ref Range   Sodium 134 (L) 135 - 145 mmol/L   Potassium >7.5 (HH) 3.5 - 5.1 mmol/L    Comment: NO VISIBLE HEMOLYSIS CRITICAL RESULT CALLED TO, READ BACK BY AND VERIFIED WITH: Carleene CooperSHEILA BINGHAM,RN [phone] @ 1624 BY J SCOTTON    Chloride 107 98 - 111 mmol/L   CO2 17 (L) 22 - 32 mmol/L   Glucose, Bld 111 (H)  70 - 99 mg/dL   BUN 94 (H) 8 - 23 mg/dL   Creatinine, Ser 4.38 (H) 0.44 - 1.00 mg/dL   Calcium 9.2 8.9 - 10.3 mg/dL   Total Protein 7.8 6.5 - 8.1 g/dL   Albumin 3.7 3.5 - 5.0 g/dL   AST 18 15 - 41 U/L   ALT 10 0 - 44 U/L   Alkaline Phosphatase 117 38 - 126 U/L   Total Bilirubin 0.7 0.3 - 1.2 mg/dL   GFR calc non Af Amer 9 (L) >60 mL/min   GFR calc Af Amer 10 (L) >60 mL/min   Anion gap 10 5  - 15    Comment: Performed at [facility], [place] [address] [place], [place] [address]  CBC     Status: Abnormal   Collection Time: 07/19/19  2:44 PM  Result Value Ref Range   WBC 8.9 4.0 - 10.5 K/uL   RBC 3.61 (L) 3.87 - 5.11 MIL/uL   Hemoglobin 10.7 (L) 12.0 - 15.0 g/dL   HCT 35.1 (L) 36.0 - 46.0 %   MCV 97.2 80.0 - 100.0 fL   MCH 29.6 26.0 - 34.0 pg   MCHC 30.5 30.0 - 36.0 g/dL   RDW 15.0 11.5 - 15.5 %   Platelets 275 150 - 400 K/uL   nRBC 0.0 0.0 - 0.2 %    Comment: Performed at [facility], [place] [address] [place], [place] [address]  Protime-INR     Status: Abnormal   Collection Time: 07/19/19  3:04 PM  Result Value Ref Range   Prothrombin Time >90.0 (H) 11.4 - 15.2 seconds   INR >10.0 (HH) 0.8 - 1.2    Comment: CRITICAL RESULT CALLED TO, READ BACK BY AND VERIFIED WITH: ZULETA,C. RN @1638  ON 07.27.2020 BY COHEN,K (NOTE) INR goal varies based on device and disease states. Performed at [facility], [place] [address], [place], [place] [address]   Urinalysis, Routine w reflex microscopic     Status: Abnormal   Collection Time: 07/19/19  3:15 PM  Result Value Ref Range   Color, Urine YELLOW YELLOW   APPearance HAZY (A) CLEAR   Specific Gravity, Urine 1.013 1.005 - 1.030   pH 5.0 5.0 - 8.0   Glucose, UA NEGATIVE NEGATIVE mg/dL   Hgb urine dipstick LARGE (A) NEGATIVE   Bilirubin Urine NEGATIVE NEGATIVE   Ketones, ur NEGATIVE NEGATIVE mg/dL   Protein, ur NEGATIVE NEGATIVE mg/dL   Nitrite NEGATIVE NEGATIVE   Leukocytes,Ua LARGE (A) NEGATIVE   RBC / HPF 6-10 0 - 5 RBC/hpf   WBC, UA 11-20 0 - 5 WBC/hpf   Bacteria, UA RARE (A) NONE SEEN   Squamous Epithelial / LPF 6-10 0 - 5   WBC Clumps PRESENT    Mucus PRESENT    Hyaline Casts, UA PRESENT    Non Squamous Epithelial 0-5 (A) NONE SEEN    Comment: Performed at [facility], [place] [address] [place],  [address]  POC occult blood, ED     Status:  Abnormal   Collection Time: 07/19/19  4:02 PM  Result Value Ref Range   Fecal Occult Bld POSITIVE (A) NEGATIVE   @[id number] @  Blood pressure (!) 105/58, pulse 83, temperature 97.9 F (36.6 C), temperature source Oral, resp. rate 17, SpO2 98 %.    Assessment/Plan  Acute Lower GI bleed: Likely secondary to coagulopathy. The patient has received Vitamin K and FFP.  Coagulopathy: INR was 1.4 on 06/26/2019. Now  greater than 10. Coumadin has been held and patient has received Vitamin K and FFP.  Non-Anion Gap Metabolic acidosis: CO2 17. Will give sodium bicarbonate. Nephrology consulted.  AKI on CKD III. Cautious IV fluids due to CHF. EF unknown. Patient with hyperkalemia with peaked T-wave. Sodium bicarbonate of 17. Nephrology has been consulted. Echocardiogram is pending.  Hyperkalemia: Recheck potassium after the ED's interventions. Monitor on telemetry. Follow potassium overnight and treat as necessary with IV D 50 and insulin. Lokelma also and option.  COPD: Noted. Stable. The patient is saturating in the 90's on room air.  Nebulizer treatments as needed.   Atrial Fibrillation: Rate is controlled. Caoumadin is being held.   Dementia: continue Aricept and Namenda. The patient will have as needed haldol for agitation.  DM II: Continue Lantus at 10 units subcutaneously daily. Follow glucoses with FSBS and SSI. She will be on a diabetic diet.  I have seen and examined this patient myself. I have spent 82 minutes in her evaluation and care.  Severity of Illness: The appropriate patient status for this patient is INPATIENT. Inpatient status is judged to be reasonable and necessary in order to provide the required intensity of service to ensure the patient's safety. The patient's presenting symptoms, physical exam findings, and initial radiographic and laboratory data in the context of their chronic comorbidities is felt to place them at high risk for further clinical deterioration.  Furthermore, it is not anticipated that the patient will be medically stable for discharge from the hospital within 2 midnights of admission. The following factors support the patient status of inpatient.   " The patient's presenting symptoms include Rectal bleeding " The worrisome physical exam findings include agitation and lack of cooperation. " The initial radiographic and laboratory data are worrisome because of Severe hyperkalemia with peaked t-waves, acidosis, and severe coagulopathy. " The chronic co-morbidities include DM II, CHF, Atrial fibrillation, Dementia, and hypertension.   * I certify that at the point of admission it is my clinical judgment that the patient will require inpatient hospital care spanning beyond 2 midnights from the point of admission due to high intensity of service, high risk for further deterioration and high frequency of surveillance required.*  CODE STATUS: DNR DVT Prophylaxis: INR is >10 Family communication: none available Disposition: TBD  Breeonna Mone 07/19/2019, 5:42 PM

## 2019-07-19 NOTE — ED Provider Notes (Signed)
[facility]-EMERGENCY DEPT Provider Note   CSN: [id number] Arrival date & time: 07/19/19  1414     History   Chief Complaint Chief Complaint  Patient presents with  . Rectal Bleeding    HPI Grace Zamora is a 84 y.o. female.  She has a history of dementia and is a level 5 caveat.  Per the nurse she was brought here by ambulance [facility] after they noticed some blood in her undergarments when they were changing her.  Unclear if rectovaginal urinary.  No other information available.  Patient denies complaints but then will intermittently yell out.  She is asking for something to drink and eat.     The history is provided by the nursing home.  Rectal Bleeding Quality:  Unable to specify Amount:  Unable to specify Timing:  Unable to specify Chronicity:  New Relieved by:  None tried Worsened by:  Nothing Ineffective treatments:  None tried Associated symptoms: no abdominal pain   Risk factors: anticoagulant use     Past Medical History:  Diagnosis Date  . Atrial fibrillation (HCC)   . CHF (congestive heart failure) (HCC)   . Diabetes mellitus without complication [facility])     Patient Active Problem List   Diagnosis Date Noted  . Closed bicondylar fracture of right tibial plateau 06/24/2019  . Fall at home, initial encounter 06/21/2019  . Atrial fibrillation, chronic 06/21/2019  . Essential hypertension 06/21/2019  . Alzheimer's dementia (HCC) 06/21/2019  . Type 2 diabetes mellitus (HCC) 06/21/2019    Past Surgical History:  Procedure Laterality Date  . ORIF TIBIA FRACTURE Right 06/23/2019   Procedure: OPEN REDUCTION INTERNAL FIXATION (ORIF) PROXIMAL TIBIA FRACTURE;  Surgeon: Roby Lofts, MD;  Location: [facility] OR;  Service: Orthopedics;  Laterality: Right;     OB History   No obstetric history on file.      Home Medications    Prior to Admission medications   Medication Sig Start Date End Date Taking? Authorizing Provider   acetaminophen (TYLENOL) 500 MG tablet Take 500 mg by mouth 3 (three) times daily.    [provider]  BREO ELLIPTA 100-25 MCG/INH AEPB Inhale 1 puff into the lungs daily. Rinse mouth after use 04/11/19   [provider]  Chloroxylenol-Zinc Oxide (BAZA EX) Apply 1 application topically as needed. Apply after brief chance    [provider]  cholecalciferol (VITAMIN D) 25 MCG (1000 UT) tablet Take 2,000 Units by mouth daily.    [provider]  diltiazem (CARDIZEM CD) 120 MG 24 hr capsule Take 120 mg by mouth daily. 04/17/19   [provider]  donepezil (ARICEPT) 10 MG tablet Take 10 mg by mouth daily. 04/11/19   [provider]  ferrous sulfate 325 (65 FE) MG tablet Take 325 mg by mouth 2 (two) times daily with a meal.    [provider]  furosemide (LASIX) 40 MG tablet Take 40 mg by mouth 2 (two) times a day. 04/07/19   [provider]  gabapentin (NEURONTIN) 100 MG capsule Take 100 mg by mouth 2 (two) times a day.  04/21/19   [provider]  HYDROcodone-acetaminophen (NORCO/VICODIN) 5-325 MG tablet Take 1 tablet by mouth every 6 (six) hours as needed for moderate pain. 06/24/19   Despina Hidden, PA-C  ipratropium-albuterol (DUONEB) 0.5-2.5 (3) MG/3ML SOLN Take 3 mLs by nebulization every 2 (two) hours as needed (Shortness of breath).    [provider]  LANTUS SOLOSTAR 100 UNIT/ML  Solostar Pen Inject 18 Units into the skin daily. 06/26/19   Almon HerculesGonfa, Taye T, MD  memantine (NAMENDA XR) 14 MG CP24 24 hr capsule Take 14 mg by mouth every evening.    [provider]  Menthol-Zinc Oxide (CALMOSEPTINE) 0.44-20.6 % OINT Apply 1 application topically every 8 (eight) hours as needed (itching).    [provider]  metoprolol tartrate (LOPRESSOR) 25 MG tablet Take 25 mg by mouth 2 (two) times a day. 04/17/19   [provider]  mirtazapine (REMERON) 15 MG tablet Take 7.5 mg by mouth at bedtime.  03/01/19    [provider]  NYSTATIN powder Apply 1 ampule topically daily. 04/12/19   [provider]  omeprazole (PRILOSEC) 20 MG capsule Take 20 mg by mouth daily. 04/14/19   [provider]  potassium chloride SA (K-DUR) 20 MEQ tablet Take 20 mEq by mouth 2 (two) times daily.  04/17/19   [provider]  Psyllium 400 MG CAPS Take 400 mg by mouth at bedtime.    [provider]  senna-docusate (SENOKOT-S) 8.6-50 MG tablet Take 1 tablet by mouth 3 (three) times a week. MWF    [provider]  sertraline (ZOLOFT) 25 MG tablet Take 25 mg by mouth daily. 04/07/19   [provider]  traZODone (DESYREL) 50 MG tablet Take 50 mg by mouth at bedtime. 04/17/19   [provider]  trolamine salicylate (ASPERCREME) 10 % cream Apply 1 application topically 2 (two) times a day.     [provider]  warfarin (COUMADIN) 4 MG tablet Take 1 tablet (4 mg total) by mouth daily at 6 PM for 30 doses. 06/27/19 07/27/19  Almon HerculesGonfa, Taye T, MD    Family History No family history on file.  Social History Social History   Tobacco Use  . Smoking status: Former Games developermoker  . Smokeless tobacco: Never Used  Substance Use Topics  . Alcohol use: Not Currently  . Drug use: Not Currently     Allergies   Lisinopril, Niacin and related, and Sulfa antibiotics   Review of Systems Review of Systems  Unable to perform ROS: Dementia  Respiratory: Negative for shortness of breath.   Cardiovascular: Negative for chest pain.  Gastrointestinal: Positive for hematochezia. Negative for abdominal pain.  Skin: Negative for rash.     Physical Exam Updated Vital Signs BP 122/68 (BP Location: Right Arm)   Pulse 68   Temp 97.9 F (36.6 C) (Oral)   Resp 16   SpO2 98%   Physical Exam Vitals signs and nursing note reviewed.  Constitutional:      General: She is not in acute distress.    Appearance: She is well-developed.  HENT:     Head: Normocephalic and  atraumatic.  Eyes:     Conjunctiva/sclera: Conjunctivae normal.  Neck:     Musculoskeletal: Neck supple.  Cardiovascular:     Rate and Rhythm: Normal rate. Rhythm irregular.     Pulses: Normal pulses.     Heart sounds: Murmur present.  Pulmonary:     Effort: Pulmonary effort is normal. No respiratory distress.     Breath sounds: Normal breath sounds.  Abdominal:     Palpations: Abdomen is soft.     Tenderness: There is no abdominal tenderness. There is no guarding.  Musculoskeletal:     Right lower leg: Edema present.     Left lower leg: Edema present.     Comments: Right knee is swollen.  There is some Steri-Strips over  her lower leg likely from her operative repair.  Skin:    General: Skin is warm and dry.     Capillary Refill: Capillary refill takes less than 2 seconds.  Neurological:     General: No focal deficit present.     Mental Status: She is alert. Mental status is at baseline.     Comments: She is awake and alert.  She is using her upper extremities and no difficulty.  She is verbal answer some simple commands.      ED Treatments / Results  Labs (all labs ordered are listed, but only abnormal results are displayed) Labs Reviewed  COMPREHENSIVE METABOLIC PANEL - Abnormal; Notable for the following components:      Result Value   Sodium 134 (*)    Potassium >7.5 (*)    CO2 17 (*)    Glucose, Bld 111 (*)    BUN 94 (*)    Creatinine, Ser 4.38 (*)    GFR calc non Af Amer 9 (*)    GFR calc Af Amer 10 (*)    All other components within normal limits  CBC - Abnormal; Notable for the following components:   RBC 3.61 (*)    Hemoglobin 10.7 (*)    HCT 35.1 (*)    All other components within normal limits  PROTIME-INR - Abnormal; Notable for the following components:   Prothrombin Time >90.0 (*)    INR >10.0 (*)    All other components within normal limits  URINALYSIS, ROUTINE W REFLEX MICROSCOPIC - Abnormal; Notable for the following components:   APPearance  HAZY (*)    Hgb urine dipstick LARGE (*)    Leukocytes,Ua LARGE (*)    Bacteria, UA RARE (*)    Non Squamous Epithelial 0-5 (*)    All other components within normal limits  COMPREHENSIVE METABOLIC PANEL - Abnormal; Notable for the following components:   Sodium 131 (*)    Potassium 5.3 (*)    CO2 16 (*)    Glucose, Bld 129 (*)    BUN 89 (*)    Creatinine, Ser 4.03 (*)    Albumin 3.1 (*)    GFR calc non Af Amer 10 (*)    GFR calc Af Amer 11 (*)    All other components within normal limits  PROTIME-INR - Abnormal; Notable for the following components:   Prothrombin Time 24.2 (*)    INR 2.2 (*)    All other components within normal limits  POTASSIUM - Abnormal; Notable for the following components:   Potassium 6.9 (*)    All other components within normal limits  BASIC METABOLIC PANEL - Abnormal; Notable for the following components:   Sodium 134 (*)    Potassium 6.3 (*)    CO2 14 (*)    Glucose, Bld 132 (*)    BUN 83 (*)    Creatinine, Ser 3.94 (*)    GFR calc non Af Amer 10 (*)    GFR calc Af Amer 12 (*)    All other components within normal limits  GLUCOSE, CAPILLARY - Abnormal; Notable for the following components:   Glucose-Capillary <10 (*)    All other components within normal limits  CBC WITH DIFFERENTIAL/PLATELET - Abnormal; Notable for the following components:   WBC 11.5 (*)    RBC 3.15 (*)    Hemoglobin 9.4 (*)    HCT 31.0 (*)    Neutro Abs 9.7 (*)    Lymphs Abs 0.5 (*)  Monocytes Absolute 1.2 (*)    All other components within normal limits  GLUCOSE, CAPILLARY - Abnormal; Notable for the following components:   Glucose-Capillary 113 (*)    All other components within normal limits  POC OCCULT BLOOD, ED - Abnormal; Notable for the following components:   Fecal Occult Bld POSITIVE (*)    All other components within normal limits  SARS CORONAVIRUS 2 (HOSPITAL ORDER, PERFORMED IN [facility])  CULTURE, BLOOD (ROUTINE X 2)  CULTURE, BLOOD  (ROUTINE X 2)  MRSA PCR SCREENING  URINE CULTURE  GLUCOSE, RANDOM  MAGNESIUM  CREATININE, URINE, RANDOM  SODIUM, URINE, RANDOM  TYPE AND SCREEN  PREPARE FRESH FROZEN PLASMA    EKG EKG Interpretation  Date/Time:  Monday July 19 2019 15:43:57 EDT Ventricular Rate:  76 PR Interval:    QRS Duration: 113 QT Interval:  410 QTC Calculation: 461 R Axis:   -21 Text Interpretation:  Atrial fibrillation Incomplete left bundle branch block sl peaking t waves compared with prior 6/20 Confirmed by Meridee Score [phone]) on 07/19/2019 4:10:23 PM Also confirmed by Meridee Score [phone]), editor Barbette Hair [phone])  on 07/20/2019 7:05:58 AM   Radiology US Renal  Result Date: 07/19/2019 CLINICAL DATA:  Acute on chronic renal failure EXAM: RENAL / URINARY TRACT ULTRASOUND COMPLETE COMPARISON:  None. FINDINGS: Right Kidney: Renal measurements: 11.4 x 5.0 x 5.8 cm = volume: 173 mL. Poorly visualized. Echogenicity within normal limits. Moderate hydronephrosis. Left Kidney: Renal measurements: 11.2 x 6.3 x 6.5 cm = volume: 239 mL. Poorly visualized. Echogenicity within normal limits. Moderate hydronephrosis. Bladder: Within normal limits.  Bilateral bladder jets are visualized. IMPRESSION: Moderate hydronephrosis bilaterally. Correlate for neurogenic bladder/bladder outlet obstruction. Electronically Signed   By: Charline Bills M.D.   On: 07/19/2019 19:25    Procedures .Critical Care Performed by: Terrilee Files, MD Authorized by: Terrilee Files, MD   Critical care provider statement:    Critical care time (minutes):  45   Critical care time was exclusive of:  Separately billable procedures and treating other patients   Critical care was necessary to treat or prevent imminent or life-threatening deterioration of the following conditions:  Metabolic crisis   Critical care was time spent personally by me on the following activities:  Discussions with consultants, evaluation of patient's  response to treatment, examination of patient, ordering and performing treatments and interventions, ordering and review of laboratory studies, ordering and review of radiographic studies, pulse oximetry, re-evaluation of patient's condition, obtaining history from patient or surrogate, review of old charts and development of treatment plan with patient or surrogate   I assumed direction of critical care for this patient from another provider in my specialty: no     (including critical care time)  Medications Ordered in ED Medications  0.9 %  sodium chloride infusion (10 mL/hr Intravenous Not Given 07/19/19 1831)  senna-docusate (Senokot-S) tablet 1 tablet (has no administration in time range)  bisacodyl (DULCOLAX) suppository 10 mg (has no administration in time range)  docusate sodium (COLACE) capsule 100 mg (100 mg Oral Not Given 07/20/19 1129)  ondansetron (ZOFRAN) tablet 4 mg (has no administration in time range)    Or  ondansetron (ZOFRAN) injection 4 mg (has no administration in time range)  sodium zirconium cyclosilicate (LOKELMA) packet 10 g (10 g Oral Given 07/20/19 0913)  diltiazem (CARDIZEM CD) 24 hr capsule 120 mg (120 mg Oral Given 07/20/19 1002)  metoprolol tartrate (LOPRESSOR) tablet 25 mg (25 mg Oral Not Given  07/20/19 1015)  memantine (NAMENDA XR) 24 hr capsule 14 mg (14 mg Oral Given 07/19/19 2158)  traZODone (DESYREL) tablet 50 mg (50 mg Oral Given 07/19/19 2158)  insulin glargine (LANTUS) injection 10 Units (10 Units Subcutaneous Given 07/20/19 0913)  pantoprazole (PROTONIX) EC tablet 40 mg (40 mg Oral Given 07/20/19 1006)  ferrous sulfate tablet 325 mg (325 mg Oral Given 07/20/19 1005)  cholecalciferol (VITAMIN D) tablet 2,000 Units (2,000 Units Oral Given 07/20/19 1004)  fluticasone furoate-vilanterol (BREO ELLIPTA) 100-25 MCG/INH 1 puff (1 puff Inhalation Not Given 07/20/19 1126)  ipratropium-albuterol (DUONEB) 0.5-2.5 (3) MG/3ML nebulizer solution 3 mL (has no administration in  time range)  nystatin (MYCOSTATIN/NYSTOP) topical powder ( Topical Not Given 07/20/19 1020)  liver oil-zinc oxide (DESITIN) 40 % ointment (has no administration in time range)  camphor-menthol (SARNA) lotion (has no administration in time range)  Chlorhexidine Gluconate Cloth 2 % PADS 6 each (6 each Topical Given 07/20/19 1016)  acetaminophen (TYLENOL) tablet 650 mg (has no administration in time range)  insulin aspart (novoLOG) injection 5 Units (5 Units Intravenous Given 07/19/19 1645)    And  dextrose 50 % solution 50 mL (50 mLs Intravenous Given 07/19/19 1646)  sodium chloride 0.9 % bolus 1,000 mL (0 mLs Intravenous Stopped 07/19/19 1851)  calcium gluconate 1 g/ 50 mL sodium chloride IVPB (0 g Intravenous Stopped 07/19/19 1751)  phytonadione (VITAMIN K) 10 mg in dextrose 5 % 50 mL IVPB (0 mg Intravenous Stopped 07/19/19 2003)  sodium chloride 0.225 % with sodium bicarbonate 50 mEq infusion ( Intravenous Stopped 07/20/19 0615)  calcium gluconate 1 g/ 50 mL sodium chloride IVPB (0 mg Intravenous Stopped 07/19/19 2139)  insulin aspart (novoLOG) injection 10 Units (10 Units Intravenous Given 07/19/19 2049)  dextrose 50 % solution 50 mL (50 mLs Intravenous Given 07/19/19 2033)  LORazepam (ATIVAN) injection 1 mg (1 mg Intravenous Given 07/19/19 2131)  dextrose 50 % solution 25 g (25 g Intravenous Given 07/20/19 0159)  dextrose 50 % solution (  Duplicate 07/20/19 0159)  sodium chloride 0.45 % bolus 500 mL (500 mLs Intravenous New Bag/Given 07/20/19 1131)     Initial Impression / Assessment and Plan / ED Course  I have reviewed the triage vital signs and the nursing notes.  Pertinent labs & imaging results that were available during my care of the patient were reviewed by me and considered in my medical decision making (see chart for details).  Clinical Course as of Jul 19 1300  Mon Jul 19, 2019  1540 With the patient's nurse as a chaperone I did a rectal exam.  She had some red-brown liquid in the vault  but also some stool.  No obvious masses.  We also did an in and out catheter that was not very successful but there is no particular blood in her vaginal or urethral area.   [MB]  [id number] 84 year old female with dementia here with possible rectal bleeding.  Differential includes GI bleed on anticoagulation, bowel ischemia, infectious diarrhea, hematuria, vaginal bleeding.   [MB]  1630 Patient's lab critically high potassium along with acute renal failure creatinine 4.38.  Her EKG does show some P wave peaking.  I have initiated medications for hyperkalemia.   [MB]  1652 Discussed with Dr. Juel BurrowLin from nephrology who will evaluate the patient in the ED.   [MB]  1710 Discussed with Dr. Gerri LinsSwayze from the hospitalist service who will see the patient for admission.  She is asking if we could start some FFP on the  patient.   [MB]  1713 I left a voicemail with the patient's daughter Blanca FriendBonnie Zamora.   [MB]    Clinical Course User Index [MB] Terrilee FilesButler, Nely Dedmon C, MD        Final Clinical Impressions(s) / ED Diagnoses   Final diagnoses:  Acute hyperkalemia  AKI (acute kidney injury) [facility](HCC)  Gastrointestinal hemorrhage, unspecified gastrointestinal hemorrhage type  Supratherapeutic INR    ED Discharge Orders    None       Terrilee FilesButler, Josha Weekley C, MD 07/20/19 1302

## 2019-07-20 ENCOUNTER — Inpatient Hospital Stay (HOSPITAL_COMMUNITY): Payer: Medicare Other

## 2019-07-20 ENCOUNTER — Other Ambulatory Visit: Payer: Self-pay

## 2019-07-20 DIAGNOSIS — Z515 Encounter for palliative care: Secondary | ICD-10-CM

## 2019-07-20 DIAGNOSIS — N170 Acute kidney failure with tubular necrosis: Secondary | ICD-10-CM

## 2019-07-20 DIAGNOSIS — I361 Nonrheumatic tricuspid (valve) insufficiency: Secondary | ICD-10-CM

## 2019-07-20 DIAGNOSIS — I340 Nonrheumatic mitral (valve) insufficiency: Secondary | ICD-10-CM

## 2019-07-20 DIAGNOSIS — Z7189 Other specified counseling: Secondary | ICD-10-CM

## 2019-07-20 LAB — CBC WITH DIFFERENTIAL/PLATELET
Abs Immature Granulocytes: 0.07 10*3/uL (ref 0.00–0.07)
Basophils Absolute: 0 10*3/uL (ref 0.0–0.1)
Basophils Relative: 0 %
Eosinophils Absolute: 0.1 10*3/uL (ref 0.0–0.5)
Eosinophils Relative: 1 %
HCT: 31 % — ABNORMAL LOW (ref 36.0–46.0)
Hemoglobin: 9.4 g/dL — ABNORMAL LOW (ref 12.0–15.0)
Immature Granulocytes: 1 %
Lymphocytes Relative: 5 %
Lymphs Abs: 0.5 10*3/uL — ABNORMAL LOW (ref 0.7–4.0)
MCH: 29.8 pg (ref 26.0–34.0)
MCHC: 30.3 g/dL (ref 30.0–36.0)
MCV: 98.4 fL (ref 80.0–100.0)
Monocytes Absolute: 1.2 10*3/uL — ABNORMAL HIGH (ref 0.1–1.0)
Monocytes Relative: 10 %
Neutro Abs: 9.7 10*3/uL — ABNORMAL HIGH (ref 1.7–7.7)
Neutrophils Relative %: 83 %
Platelets: 217 10*3/uL (ref 150–400)
RBC: 3.15 MIL/uL — ABNORMAL LOW (ref 3.87–5.11)
RDW: 15.3 % (ref 11.5–15.5)
WBC: 11.5 10*3/uL — ABNORMAL HIGH (ref 4.0–10.5)
nRBC: 0 % (ref 0.0–0.2)

## 2019-07-20 LAB — BASIC METABOLIC PANEL
Anion gap: 7 (ref 5–15)
BUN: 71 mg/dL — ABNORMAL HIGH (ref 8–23)
CO2: 19 mmol/L — ABNORMAL LOW (ref 22–32)
Calcium: 9 mg/dL (ref 8.9–10.3)
Chloride: 110 mmol/L (ref 98–111)
Creatinine, Ser: 2.94 mg/dL — ABNORMAL HIGH (ref 0.44–1.00)
GFR calc Af Amer: 16 mL/min — ABNORMAL LOW (ref >60)
GFR calc non Af Amer: 14 mL/min — ABNORMAL LOW (ref >60)
Glucose, Bld: 97 mg/dL (ref 70–99)
Potassium: 5.1 mmol/L (ref 3.5–5.1)
Sodium: 136 mmol/L (ref 135–145)

## 2019-07-20 LAB — COMPREHENSIVE METABOLIC PANEL
ALT: 11 U/L (ref 0–44)
AST: 17 U/L (ref 15–41)
Albumin: 3.1 g/dL — ABNORMAL LOW (ref 3.5–5.0)
Alkaline Phosphatase: 97 U/L (ref 38–126)
Anion gap: 7 (ref 5–15)
BUN: 89 mg/dL — ABNORMAL HIGH (ref 8–23)
CO2: 16 mmol/L — ABNORMAL LOW (ref 22–32)
Calcium: 9.2 mg/dL (ref 8.9–10.3)
Chloride: 108 mmol/L (ref 98–111)
Creatinine, Ser: 4.03 mg/dL — ABNORMAL HIGH (ref 0.44–1.00)
GFR calc Af Amer: 11 mL/min — ABNORMAL LOW (ref >60)
GFR calc non Af Amer: 10 mL/min — ABNORMAL LOW (ref >60)
Glucose, Bld: 129 mg/dL — ABNORMAL HIGH (ref 70–99)
Potassium: 5.3 mmol/L — ABNORMAL HIGH (ref 3.5–5.1)
Sodium: 131 mmol/L — ABNORMAL LOW (ref 135–145)
Total Bilirubin: 0.7 mg/dL (ref 0.3–1.2)
Total Protein: 6.6 g/dL (ref 6.5–8.1)

## 2019-07-20 LAB — GLUCOSE, CAPILLARY
Glucose-Capillary: 113 mg/dL — ABNORMAL HIGH (ref 70–99)
Glucose-Capillary: <10 mg/dL — CL (ref 70–99)
Glucose-Capillary: 87 mg/dL (ref 70–99)
Glucose-Capillary: 94 mg/dL (ref 70–99)

## 2019-07-20 LAB — PROTIME-INR
INR: 2.2 — ABNORMAL HIGH (ref 0.8–1.2)
Prothrombin Time: 24.2 seconds — ABNORMAL HIGH (ref 11.4–15.2)

## 2019-07-20 LAB — SODIUM, URINE, RANDOM: Sodium, Ur: 20 mmol/L

## 2019-07-20 LAB — ECHOCARDIOGRAM COMPLETE

## 2019-07-20 LAB — CREATININE, URINE, RANDOM: Creatinine, Urine: 58.73 mg/dL

## 2019-07-20 MED ORDER — INSULIN ASPART 100 UNIT/ML ~~LOC~~ SOLN: 0.0000 [IU] | Freq: Every day | SUBCUTANEOUS | Status: DC

## 2019-07-20 MED ORDER — ACETAMINOPHEN 325 MG PO TABS
650.0000 mg | ORAL_TABLET | ORAL | Status: DC | PRN
  Administered 2019-07-20 – 2019-07-22 (×2): 650 mg via ORAL
  Filled 2019-07-20 (×2): qty 2

## 2019-07-20 MED ORDER — DEXTROSE 50 % IV SOLN
25.0000 g | INTRAVENOUS | Status: AC
  Administered 2019-07-20: 02:00:00 25 g via INTRAVENOUS

## 2019-07-20 MED ORDER — INSULIN ASPART 100 UNIT/ML ~~LOC~~ SOLN
0.0000 [IU] | Freq: Three times a day (TID) | SUBCUTANEOUS | Status: DC
  Administered 2019-07-21: 1 [IU] via SUBCUTANEOUS

## 2019-07-20 MED ORDER — SODIUM CHLORIDE 0.45 % IV BOLUS
500.0000 mL | Freq: Once | INTRAVENOUS | Status: AC
  Administered 2019-07-20: 500 mL via INTRAVENOUS

## 2019-07-20 MED ORDER — METOPROLOL TARTRATE 5 MG/5ML IV SOLN
5.0000 mg | Freq: Once | INTRAVENOUS | Status: AC
  Administered 2019-07-20: 20:00:00 5 mg via INTRAVENOUS
  Filled 2019-07-20: qty 5

## 2019-07-20 MED ORDER — SODIUM CHLORIDE 0.9 % IV BOLUS
250.0000 mL | Freq: Once | INTRAVENOUS | Status: AC
  Administered 2019-07-20: 16:00:00 250 mL via INTRAVENOUS

## 2019-07-20 MED ORDER — SODIUM CHLORIDE 0.9 % IV SOLN
INTRAVENOUS | Status: AC
  Administered 2019-07-20: 14:00:00 via INTRAVENOUS

## 2019-07-20 MED ORDER — SODIUM CHLORIDE 0.9 % IV BOLUS
500.0000 mL | Freq: Once | INTRAVENOUS | Status: AC
  Administered 2019-07-20: 500 mL via INTRAVENOUS

## 2019-07-20 MED ORDER — INSULIN GLARGINE 100 UNIT/ML ~~LOC~~ SOLN
10.0000 [IU] | Freq: Every day | SUBCUTANEOUS | Status: DC
  Administered 2019-07-20 – 2019-07-22 (×3): 10 [IU] via SUBCUTANEOUS
  Filled 2019-07-20 (×4): qty 0.1

## 2019-07-20 MED ORDER — SODIUM BICARBONATE 650 MG PO TABS
650.0000 mg | ORAL_TABLET | Freq: Three times a day (TID) | ORAL | Status: DC
  Administered 2019-07-20 – 2019-07-22 (×6): 650 mg via ORAL
  Filled 2019-07-20 (×6): qty 1

## 2019-07-20 MED ORDER — DEXTROSE 50 % IV SOLN
INTRAVENOUS | Status: AC
  Filled 2019-07-20: qty 50

## 2019-07-20 NOTE — Progress Notes (Signed)
PROGRESS NOTE    Grace ReichertMary Zamora  RUE:[id number] DOB: 11/10/1935 DOA: 07/19/2019 PCP: Eloisa NorthernAmin, Saad, MD   Brief Narrative:   The patient is a 84 yr old woman who carries a past medical history for dementia, DM II, atrial fibrillation, and CHF. She was recently discharged from [facility] on 06/26/2019 after an admission for a closed bicondylar fracture of the right tibial Plateau. [facility].  She was admitted for evaluation of bright red blood per rectum and AKI.   Assessment & Plan:   Active Problems:   Acute renal failure (ARF) (HCC)   Bright red blood per rectum Appears to have resolved. Probably secondary to coagulopathy.  INR is around 2 today.  No further episodes of bleeding.  Patient's hemoglobin is around 10 at baseline, hemoglobin around 9.4 this morning.   Acute kidney injury  probably secondary to prerenal azotemia and obstruction secondary to neurogenic bladder. In view of her dementia she is not a candidate for dialysis. Foley catheter was placed last night secondary to retention. Continue with IV fluids and monitor renal parameters.    Hyperkalemia Improving Lokelma ordered. Repeat BMP tonight.  Moderate bilateral hydronephrosis CT of the abdomen/stone protocol Urology consulted Plan to continue the Foley catheter for now.  Hypotension Fluid resuscitation to keep map greater than 65.    Chronic diastolic heart heart failure She appears to be compensated at this time.  Holding Lasix for hypotension.    Dementia She appears to be at baseline.    Insulin-dependent diabetes mellitus Start the patient on sliding scale insulin and long-acting regimen  Chronic atrial fibrillation Rate controlled with diltiazem.  Holding Coumadin for now INR therapeutic.   COPD No wheezing heard currently on room air.   Right bicondylar tibial plateau fracture status post ORIF on July 1 Weightbearing as tolerated.  PT/OT eval will be ordered.  In view of multiple comorbidities and acute renal failure palliative care consulted for goals of care.   DVT prophylaxis: (SCDs Code Status: DNR Family Communication: (None at bedside Disposition Plan: Pending clinical improvement  Consultants:   Nephrology  Urology  Procedures: CT abdomen/stone protocol Antimicrobials: None  Subjective: Lethargic, sleepy and was able to say her name and date of birth.  Objective: Vitals:   07/20/19 0900 07/20/19 1000 07/20/19 1002 07/20/19 1100  BP: (!) 130/45 (!) 114/41 (!) 114/41 (!) 97/37  Pulse:  98  94  Resp: 17 13  16   Temp:      TempSrc:      SpO2:  98%  97%    Intake/Output Summary (Last 24 hours) at 07/20/2019 1246 Last data filed at 07/20/2019 0600 Gross per 24 hour  Intake 685.33 ml  Output 3300 ml  Net -2614.67 ml   There were no vitals filed for this visit.  Examination:  General exam: Appears calm and comfortable  Respiratory system: Clear to auscultation. Respiratory effort normal. Cardiovascular system: S1 & S2 heard, irregular  gastrointestinal system: Abdomen is nondistended, soft and nontender. No organomegaly or masses felt. Normal bowel sounds heard. Central nervous system: Sleepy was given a dose of Vicodin.  Was able to tell me her name. Extremities: Right leg externally rotated and tender.  Psychiatry: Cannot be assessed    Data Reviewed: I have personally reviewed following labs and imaging studies  CBC: Recent Labs  Lab 07/19/19 1444 07/20/19 0154  WBC 8.9 11.5*  NEUTROABS  --  9.7*  HGB 10.7* 9.4*  HCT 35.1* 31.0*  MCV 97.2 98.4  PLT 275 217   Basic Metabolic Panel: Recent Labs  Lab 07/19/19 1444 07/19/19 1859 07/19/19 2129 07/20/19 0154  NA 134*  --  134* 131*  K >7.5* 6.9* 6.3* 5.3*  CL 107  --  109 108  CO2 17*  --  14* 16*  GLUCOSE 111* 89 132* 129*  BUN 94*  --  83* 89*  CREATININE 4.38*  --  3.94* 4.03*  CALCIUM 9.2  --  9.2 9.2  MG  --   2.2  --   --    GFR: CrCl cannot be calculated (Unknown ideal weight.). Liver Function Tests: Recent Labs  Lab 07/19/19 1444 07/20/19 0154  AST 18 17  ALT 10 11  ALKPHOS 117 97  BILITOT 0.7 0.7  PROT 7.8 6.6  ALBUMIN 3.7 3.1*   No results for input(s): LIPASE, AMYLASE in the last 168 hours. No results for input(s): AMMONIA in the last 168 hours. Coagulation Profile: Recent Labs  Lab 07/19/19 1504 07/20/19 0154  INR >10.0* 2.2*   Cardiac Enzymes: No results for input(s): CKTOTAL, CKMB, CKMBINDEX, TROPONINI in the last 168 hours. BNP (last 3 results) No results for input(s): PROBNP in the last 8760 hours. HbA1C: No results for input(s): HGBA1C in the last 72 hours. CBG: Recent Labs  Lab 07/20/19 0132 07/20/19 0154  GLUCAP <10* 113*   Lipid Profile: No results for input(s): CHOL, HDL, LDLCALC, TRIG, CHOLHDL, LDLDIRECT in the last 72 hours. Thyroid Function Tests: No results for input(s): TSH, T4TOTAL, FREET4, T3FREE, THYROIDAB in the last 72 hours. Anemia Panel: No results for input(s): VITAMINB12, FOLATE, FERRITIN, TIBC, IRON, RETICCTPCT in the last 72 hours. Sepsis Labs: No results for input(s): PROCALCITON, LATICACIDVEN in the last 168 hours.  Recent Results (from the past 240 hour(s))  SARS Coronavirus 2 (CEPHEID - Performed in [facility] hospital lab), Hosp Order     Status: None   Collection Time: 07/19/19  3:45 PM   Specimen: Nasopharyngeal Swab  Result Value Ref Range Status   SARS Coronavirus 2 NEGATIVE NEGATIVE Final    Comment: (NOTE) If result is NEGATIVE SARS-CoV-2 target nucleic acids are NOT DETECTED. The SARS-CoV-2 RNA is generally detectable in upper and lower  respiratory specimens during the acute phase of infection. The lowest  concentration of SARS-CoV-2 viral copies this assay can detect is 250  copies / mL. A negative result does not preclude SARS-CoV-2 infection  and should not be used as the sole basis for treatment or other  patient  management decisions.  A negative result may occur with  improper specimen collection / handling, submission of specimen other  than nasopharyngeal swab, presence of viral mutation(s) within the  areas targeted by this assay, and inadequate number of viral copies  (<250 copies / mL). A negative result must be combined with clinical  observations, patient history, and epidemiological information. If result is POSITIVE SARS-CoV-2 target nucleic acids are DETECTED. The SARS-CoV-2 RNA is generally detectable in upper and lower  respiratory specimens dur ing the acute phase of infection.  Positive  results are indicative of active infection with SARS-CoV-2.  Clinical  correlation with patient history and other diagnostic information is  necessary to determine patient infection status.  Positive results do  not rule out bacterial infection or co-infection with other viruses. If result is PRESUMPTIVE POSTIVE SARS-CoV-2 nucleic acids MAY BE PRESENT.   A presumptive positive result was obtained on the submitted specimen  and confirmed on repeat testing.  While  2019 novel coronavirus  (SARS-CoV-2) nucleic acids may be present in the submitted sample  additional confirmatory testing may be necessary for epidemiological  and / or clinical management purposes  to differentiate between  SARS-CoV-2 and other Sarbecovirus currently known to infect humans.  If clinically indicated additional testing with an alternate test  methodology [phone]) is advised. The SARS-CoV-2 RNA is generally  detectable in upper and lower respiratory sp ecimens during the acute  phase of infection. The expected result is Negative. Fact Sheet for Patients:  StrictlyIdeas.no Fact Sheet for Healthcare Providers: BankingDealers.co.za This test is not yet approved or cleared by the [place] FDA and has been authorized for detection and/or diagnosis of SARS-CoV-2 by FDA under  an Emergency Use Authorization (EUA).  This EUA will remain in effect (meaning this test can be used) for the duration of the COVID-19 declaration under Section 564(b)(1) of the Act, 21 U.S.C. section 360bbb-3(b)(1), unless the authorization is terminated or revoked sooner. Performed at [facility], [place] [address] [place], [place] [address]   Culture, blood (routine x 2)     Status: None (Preliminary result)   Collection Time: 07/19/19  5:23 PM   Specimen: BLOOD  Result Value Ref Range Status   Specimen Description   Final    BLOOD RIGHT ANTECUBITAL Performed at [facility] [address] [place], [place] [address]    Special Requests   Final    BOTTLES DRAWN AEROBIC AND ANAEROBIC Blood Culture adequate volume Performed at [place] [address] [place], [place] [address]    Culture   Final    NO GROWTH < 12 HOURS Performed at [facility] [address] [place], [place] [address]    Report Status PENDING  Incomplete  Culture, blood (routine x 2)     Status: None (Preliminary result)   Collection Time: 07/19/19  6:59 PM   Specimen: BLOOD RIGHT HAND  Result Value Ref Range Status   Specimen Description   Final    BLOOD RIGHT HAND Performed at [facility] [address] [place], [place] [address]    Special Requests   Final    BOTTLES DRAWN AEROBIC ONLY Blood Culture adequate volume Performed at [facility] [address], [place], [place] [address]    Culture   Final    NO GROWTH < 12 HOURS Performed at [facility] [address], [place], [place] [address]    Report Status PENDING  Incomplete  MRSA PCR Screening     Status: None   Collection Time: 07/19/19  7:45 PM   Specimen: Nasal Mucosa; Nasopharyngeal  Result Value Ref Range Status   MRSA by PCR NEGATIVE NEGATIVE Final    Comment:        The GeneXpert MRSA Assay (FDA approved for NASAL  specimens only), is one component of a comprehensive MRSA colonization surveillance program. It is not intended to diagnose MRSA infection nor to guide or monitor treatment for MRSA infections. Performed at [facility], [place] [address] [place], [place] [address]          Radiology Studies: US Renal  Result Date: 07/19/2019 CLINICAL DATA:  Acute on chronic renal failure EXAM: RENAL / URINARY TRACT ULTRASOUND COMPLETE COMPARISON:  None. FINDINGS: Right Kidney: Renal measurements: 11.4 x 5.0 x 5.8 cm = volume: 173 mL. Poorly visualized. Echogenicity within normal limits. Moderate hydronephrosis. Left Kidney: Renal measurements: 11.2 x  6.3 x 6.5 cm = volume: 239 mL. Poorly visualized. Echogenicity within normal limits. Moderate hydronephrosis. Bladder: Within normal limits.  Bilateral bladder jets are visualized. IMPRESSION: Moderate hydronephrosis bilaterally. Correlate for neurogenic bladder/bladder outlet obstruction. Electronically Signed   By: Charline BillsSriyesh  Krishnan M.D.   On: 07/19/2019 19:25        Scheduled Meds: . Chlorhexidine Gluconate Cloth  6 each Topical Daily  . cholecalciferol  2,000 Units Oral Daily  . diltiazem  120 mg Oral Daily  . docusate sodium  100 mg Oral BID  . ferrous sulfate  325 mg Oral BID WC  . fluticasone furoate-vilanterol  1 puff Inhalation Daily  . insulin glargine  10 Units Subcutaneous Daily  . memantine  14 mg Oral QPM  . metoprolol tartrate  25 mg Oral BID  . nystatin   Topical BID  . pantoprazole  40 mg Oral Daily  . sodium zirconium cyclosilicate  10 g Oral TID  . traZODone  50 mg Oral QHS   Continuous Infusions: . sodium chloride       LOS: 1 day    Time spent: 6636 MINUTES    Kathlen ModyVijaya Loxley Cibrian, MD [facility] Pager [phone]  If 7PM-7AM, please contact night-coverage [email or web address] Password [email or web address] 07/20/2019, 12:46 PM

## 2019-07-20 NOTE — TOC Initial Note (Signed)
Transition of Care [facility]) - Initial/Assessment Note    Patient Details  Name: Grace Zamora MRN: [id number] Date of Birth: 19-Jul-1935  Transition of Care [facility] CM/SW Contact:    Nila Nephew, LCSW Phone Number: [phone] 07/20/2019, 11:08 AM  Clinical Narrative:   Pt admitted with GI bleed, from [facility] memory care (ALF). Recent admission at [facility] about a month ago for fall/fracture. Was getting HHPT at [facility]. Spoke with Helene Kelp at [facility] who is aware of pt's admission and can be updated on care needs as pt progresses. (Last admission SNF was recommended however family opted to have pt return to memory care due to concerns about a different environment affecting pt's mental status) Will need updated FL2                Expected Discharge Plan: Assisted Living Barriers to Discharge: Continued Medical Work up   Patient Goals and CMS Choice Patient states their goals for this hospitalization and ongoing recovery are:: pt with Alzheimer's not oriented or able to meaningfully participate      Expected Discharge Plan and Services Expected Discharge Plan: Assisted Living In-house Referral: Clinical Social Work     Living arrangements for the past 2 months: [place]                                      Prior Living Arrangements/Services Living arrangements for the past 2 months: [place] Lives with:: Facility Resident Patient language and need for interpreter reviewed:: No Do you feel safe going back to the place where you live?: Yes      Need for Family Participation in Patient Care: Yes (Comment)(daughter) Care giver support system in place?: Yes (comment)(memory care resident) Current home services: Home PT Criminal Activity/Legal Involvement Pertinent to Current Situation/Hospitalization: No - Comment as needed  Activities of Daily Living Home Assistive Devices/Equipment: Grab bars around toilet, Grab bars in  shower, Hand-held shower hose, Blood pressure cuff, Scales, Wheelchair, Oxygen, Nebulizer, CBG Meter([facility] has necessary equipment for their residents) ADL Screening (condition at time of admission) Patient's cognitive ability adequate to safely complete daily activities?: No Is the patient deaf or have difficulty hearing?: No Does the patient have difficulty seeing, even when wearing glasses/contacts?: No Does the patient have difficulty concentrating, remembering, or making decisions?: Yes Patient able to express need for assistance with ADLs?: No Does the patient have difficulty dressing or bathing?: Yes Independently performs ADLs?: No Communication: Independent Dressing (OT): Dependent Is this a change from baseline?: Pre-admission baseline Grooming: Dependent Is this a change from baseline?: Pre-admission baseline Feeding: Dependent Is this a change from baseline?: Pre-admission baseline Bathing: Dependent Is this a change from baseline?: Pre-admission baseline Toileting: Dependent Is this a change from baseline?: Pre-admission baseline In/Out Bed: Dependent Is this a change from baseline?: Pre-admission baseline Walks in Home: Dependent Is this a change from baseline?: Pre-admission baseline Does the patient have difficulty walking or climbing stairs?: Yes Weakness of Legs: Both(Right>Left) Weakness of Arms/Hands: Both  Permission Sought/Granted Permission sought to share information with : Family Supports, Customer service manager    Share Information with NAME: Daughter Horris Latino [phone]  Permission granted to share info w AGENCY: [facility] Helene Kelp [phone]        Emotional Assessment       Orientation: : Oriented to Self Alcohol / Substance Use: Not Applicable  Psych Involvement: No (comment)  Admission diagnosis:  Acute hyperkalemia [E87.5] AKI (acute kidney injury) (HCC) [N17.9] Supratherapeutic INR [R79.1] Renal  failure (ARF), acute on chronic (HCC) [N17.9, N18.9] Gastrointestinal hemorrhage, unspecified gastrointestinal hemorrhage type [K92.2] Patient Active Problem List   Diagnosis Date Noted  . Acute renal failure (ARF) (HCC) 07/19/2019  . Closed bicondylar fracture of right tibial plateau 06/24/2019  . Fall at home, initial encounter 06/21/2019  . Atrial fibrillation, chronic 06/21/2019  . Essential hypertension 06/21/2019  . Alzheimer's dementia (HCC) 06/21/2019  . Type 2 diabetes mellitus (HCC) 06/21/2019   PCP:  Eloisa NorthernAmin, Saad, MD Pharmacy:  No Pharmacies Listed    Social Determinants of Health (SDOH) Interventions    Readmission Risk Interventions No flowsheet data found.

## 2019-07-20 NOTE — Consult Note (Signed)
I have been asked to see the patient by Dr. Harrie Jeans, for evaluation and management of obstructive renal failure.  History of present illness: 84 year old female who presented to the emergency department for bleeding.  In the ED she was noted to have some rectal bleeding.  She was also noted to have significantly worsened renal function from several weeks prior.  At that time she was having her leg fixed after suffering from a fall.  Nephrology was consulted given her significantly worsening renal failure and ultimately a renal ultrasound was performed which demonstrated bilateral hydronephrosis and a significantly distended bladder.  CT scan follow-up demonstrated no obstruction of the ureters.  Foley catheter was placed.  Since the catheter was placed the patient has made significant amount of urine.  The patient is unable to participate in any meaningful conversation or history because of her profound dementia.  However, she is not complaining of any significant pain today.  Review of systems: A 12 point comprehensive review of systems was obtained and is negative unless otherwise stated in the history of present illness.  Patient Active Problem List   Diagnosis Date Noted  . Acute renal failure (ARF) ([place]) 07/19/2019  . Closed bicondylar fracture of right tibial plateau 06/24/2019  . Fall at home, initial encounter 06/21/2019  . Atrial fibrillation, chronic 06/21/2019  . Essential hypertension 06/21/2019  . Alzheimer's dementia ([place]) 06/21/2019  . Type 2 diabetes mellitus ([place]) 06/21/2019    No current facility-administered medications on file prior to encounter.    Current Outpatient Medications on File Prior to Encounter  Medication Sig Dispense Refill  . acetaminophen (TYLENOL) 500 MG tablet Take 500 mg by mouth 3 (three) times daily.    Marland Kitchen BREO ELLIPTA 100-25 MCG/INH AEPB Inhale 1 puff into the lungs daily. Rinse mouth after use    . Chloroxylenol-Zinc Oxide (BAZA EX) Apply 1  application topically 3 (three) times daily. Apply after brief chance     . cholecalciferol (VITAMIN D) 25 MCG (1000 UT) tablet Take 2,000 Units by mouth daily.    Marland Kitchen diltiazem (CARDIZEM CD) 120 MG 24 hr capsule Take 120 mg by mouth daily.    Marland Kitchen donepezil (ARICEPT) 10 MG tablet Take 10 mg by mouth at bedtime.     . ferrous sulfate 325 (65 FE) MG tablet Take 325 mg by mouth 2 (two) times daily with a meal.    . furosemide (LASIX) 40 MG tablet Take 40 mg by mouth 2 (two) times a day.    Marland Kitchen HYDROcodone-acetaminophen (NORCO/VICODIN) 5-325 MG tablet Take 1 tablet by mouth every 6 (six) hours as needed for moderate pain. (Patient taking differently: Take 0.5 tablets by mouth as directed. Take 1 tablet by mouth TID for 7 Days and Take 0.5 tablet by mouth every 6 hours PRN for Pain) 12 tablet 0  . LANTUS SOLOSTAR 100 UNIT/ML Solostar Pen Inject 18 Units into the skin daily. 15 mL 11  . magnesium citrate SOLN Take 1 Bottle by mouth daily as needed for moderate constipation or severe constipation.    . memantine (NAMENDA XR) 14 MG CP24 24 hr capsule Take 14 mg by mouth every evening.    . metoprolol tartrate (LOPRESSOR) 25 MG tablet Take 25 mg by mouth 2 (two) times a day.    . mirtazapine (REMERON) 15 MG tablet Take 7.5 mg by mouth at bedtime.     . NYSTATIN powder Apply 1 ampule topically daily.    Marland Kitchen omeprazole (PRILOSEC) 20 MG capsule Take  20 mg by mouth daily.    . potassium chloride SA (K-DUR) 20 MEQ tablet Take 20 mEq by mouth 2 (two) times daily.     . Psyllium 400 MG CAPS Take 400 mg by mouth at bedtime.    . sertraline (ZOLOFT) 25 MG tablet Take 25 mg by mouth daily.    . traZODone (DESYREL) 50 MG tablet Take 50 mg by mouth at bedtime.    . trolamine salicylate (ASPERCREME) 10 % cream Apply 1 application topically 2 (two) times a day.     . warfarin (COUMADIN) 4 MG tablet Take 1 tablet (4 mg total) by mouth daily at 6 PM for 30 doses. 30 tablet 0  . ipratropium-albuterol (DUONEB) 0.5-2.5 (3) MG/3ML  SOLN Take 3 mLs by nebulization every 2 (two) hours as needed (Shortness of breath).    . Menthol-Zinc Oxide (CALMOSEPTINE) 0.44-20.6 % OINT Apply 1 application topically every 8 (eight) hours as needed (itching).    Marland Kitchen. senna-docusate (SENOKOT-S) 8.6-50 MG tablet Take 1 tablet by mouth 3 (three) times a week. MWF      Past Medical History:  Diagnosis Date  . Atrial fibrillation (HCC)   . CHF (congestive heart failure) (HCC)   . Diabetes mellitus without complication [facility](HCC)     Past Surgical History:  Procedure Laterality Date  . ORIF TIBIA FRACTURE Right 06/23/2019   Procedure: OPEN REDUCTION INTERNAL FIXATION (ORIF) PROXIMAL TIBIA FRACTURE;  Surgeon: Roby LoftsHaddix, Kevin P, MD;  Location: [facility] OR;  Service: Orthopedics;  Laterality: Right;    Social History   Tobacco Use  . Smoking status: Former Games developermoker  . Smokeless tobacco: Never Used  Substance Use Topics  . Alcohol use: Not Currently  . Drug use: Not Currently    History reviewed. No pertinent family history.  PE: Vitals:   07/20/19 1400 07/20/19 1500 07/20/19 1600 07/20/19 1700  BP: 121/62   119/75  Pulse: (!) 105 (!) 117 (!) 118 (!) 128  Resp: 18 14 18 18   Temp: 99.5 F (37.5 C)  99.5 F (37.5 C) 99.5 F (37.5 C)  TempSrc: Core  Core Core  SpO2: 98% (!) 89% 98% 94%    Intake/Output Summary (Last 24 hours) at 07/20/2019 1803 Last data filed at 07/20/2019 1400 Gross per 24 hour  Intake 926.85 ml  Output 3300 ml  Net -2373.15 ml    Patient appears to be in no acute distress  patient is alert but completely disoriented Atraumatic normocephalic head No cervical or supraclavicular lymphadenopathy appreciated No increased work of breathing, no audible wheezes/rhonchi Regular sinus rhythm/rate Abdomen is soft, nontender, nondistended, no CVA or suprapubic tenderness Patient's right lower extremity has some Steri-Strip over the proximal tibia.  Her legs do not appear to be grossly edematous. Grossly neurologically intact No  identifiable skin lesions  Recent Labs    07/19/19 1444 07/20/19 0154  WBC 8.9 11.5*  HGB 10.7* 9.4*  HCT 35.1* 31.0*   Recent Labs    07/19/19 1444 07/19/19 1859 07/19/19 2129 07/20/19 0154  NA 134*  --  134* 131*  K >7.5* 6.9* 6.3* 5.3*  CL 107  --  109 108  CO2 17*  --  14* 16*  GLUCOSE 111* 89 132* 129*  BUN 94*  --  83* 89*  CREATININE 4.38*  --  3.94* 4.03*  CALCIUM 9.2  --  9.2 9.2   Recent Labs    07/19/19 1504 07/20/19 0154  INR >10.0* 2.2*   No results for input(s): LABURIN in the  last 72 hours. Results for orders placed or performed during the hospital encounter of 07/19/19  SARS Coronavirus 2 (CEPHEID - Performed in [facility] hospital lab), Hosp Order     Status: None   Collection Time: 07/19/19  3:45 PM   Specimen: Nasopharyngeal Swab  Result Value Ref Range Status   SARS Coronavirus 2 NEGATIVE NEGATIVE Final    Comment: (NOTE) If result is NEGATIVE SARS-CoV-2 target nucleic acids are NOT DETECTED. The SARS-CoV-2 RNA is generally detectable in upper and lower  respiratory specimens during the acute phase of infection. The lowest  concentration of SARS-CoV-2 viral copies this assay can detect is 250  copies / mL. A negative result does not preclude SARS-CoV-2 infection  and should not be used as the sole basis for treatment or other  patient management decisions.  A negative result may occur with  improper specimen collection / handling, submission of specimen other  than nasopharyngeal swab, presence of viral mutation(s) within the  areas targeted by this assay, and inadequate number of viral copies  (<250 copies / mL). A negative result must be combined with clinical  observations, patient history, and epidemiological information. If result is POSITIVE SARS-CoV-2 target nucleic acids are DETECTED. The SARS-CoV-2 RNA is generally detectable in upper and lower  respiratory specimens dur ing the acute phase of infection.  Positive  results are  indicative of active infection with SARS-CoV-2.  Clinical  correlation with patient history and other diagnostic information is  necessary to determine patient infection status.  Positive results do  not rule out bacterial infection or co-infection with other viruses. If result is PRESUMPTIVE POSTIVE SARS-CoV-2 nucleic acids MAY BE PRESENT.   A presumptive positive result was obtained on the submitted specimen  and confirmed on repeat testing.  While 2019 novel coronavirus  (SARS-CoV-2) nucleic acids may be present in the submitted sample  additional confirmatory testing may be necessary for epidemiological  and / or clinical management purposes  to differentiate between  SARS-CoV-2 and other Sarbecovirus currently known to infect humans.  If clinically indicated additional testing with an alternate test  methodology [phone]([id number]) is advised. The SARS-CoV-2 RNA is generally  detectable in upper and lower respiratory sp ecimens during the acute  phase of infection. The expected result is Negative. Fact Sheet for Patients:  BoilerBrush.com.cyhttps://www.fda.gov/media/136312/download Fact Sheet for Healthcare Providers: https://pope.com/https://www.fda.gov/media/136313/download This test is not yet approved or cleared by the [place] FDA and has been authorized for detection and/or diagnosis of SARS-CoV-2 by FDA under an Emergency Use Authorization (EUA).  This EUA will remain in effect (meaning this test can be used) for the duration of the COVID-19 declaration under Section 564(b)(1) of the Act, 21 U.S.C. section 360bbb-3(b)(1), unless the authorization is terminated or revoked sooner. Performed at [facility], [address] [place], [place] [address]   Culture, blood (routine x 2)     Status: None (Preliminary result)   Collection Time: 07/19/19  5:23 PM   Specimen: BLOOD  Result Value Ref Range Status   Specimen Description   Final    BLOOD RIGHT ANTECUBITAL Performed at [facility], [address] [place], [place] [address]    Special Requests   Final    BOTTLES DRAWN AEROBIC AND ANAEROBIC Blood Culture adequate volume Performed at [facility], [address] [place], [place] [address]    Culture   Final    NO GROWTH < 12 HOURS Performed at [facility],  [address] [place], [place] [address]    Report Status PENDING  Incomplete  Culture, blood (routine x 2)     Status: None (Preliminary result)   Collection Time: 07/19/19  6:59 PM   Specimen: BLOOD RIGHT HAND  Result Value Ref Range Status   Specimen Description   Final    BLOOD RIGHT HAND Performed at [facility], [address] [place], [place] [address]    Special Requests   Final    BOTTLES DRAWN AEROBIC ONLY Blood Culture adequate volume Performed at [facility], [address] [place], [place] [address]    Culture   Final    NO GROWTH < 12 HOURS Performed at [facility], [address] [place], [place] [address]    Report Status PENDING  Incomplete  MRSA PCR Screening     Status: None   Collection Time: 07/19/19  7:45 PM   Specimen: Nasal Mucosa; Nasopharyngeal  Result Value Ref Range Status   MRSA by PCR NEGATIVE NEGATIVE Final    Comment:        The GeneXpert MRSA Assay (FDA approved for NASAL specimens only), is one component of a comprehensive MRSA colonization surveillance program. It is not intended to diagnose MRSA infection nor to guide or monitor treatment for MRSA infections. Performed at [facility], [address] [place], [place] [address]     Imaging: I have independently reviewed the patient's renal ultrasound as well as the CT scan which demonstrates bilateral hydroureteronephrosis and severe bladder distention.  Imp: The patient developed urinary retention for some unknown cause which subsequently resulted in her developing obstructive renal failure.   She does have a large stool ball in her rectum which may be contributing to her retention.  She has profound dementia and may be suffering from bladder neglect.  Since the catheter has been placed he has been making a large amount of urine, may be experiencing some postobstructive diuresis.  Recommendations: Would recommend leaving the catheter for a minimum of 1 week prior to performing a voiding trial.  Would recommend repeating the ultrasound at least 48 hours from the time of the Foley catheter being placed.  Perhaps once the patient's renal function improves her mental status will also improve and give her the opportunity to participate in a meaningful voiding trial.  If not, the patient may need an indwelling Foley for an extended period of time.  In this event, would be happy to see her in clinic for a voiding trial.  Thank you for involving me in this patient's care, I will continue to follow along.  Crist FatBenjamin W Kimberlynn Lumbra

## 2019-07-20 NOTE — Progress Notes (Signed)
  Echocardiogram 2D Echocardiogram has been performed.  Jannett Celestine 07/20/2019, 2:45 PM

## 2019-07-20 NOTE — Progress Notes (Signed)
[facility] Progress Note  Name: Grace ReichertMary Zamora MRN: [id number] DOB: 1935-06-15  Chief Complaint:  Presented with BRB per rectum from her facility   Subjective:  Foley was placed last night.  Interim renal ultrasound with moderate bilateral hydro.  No rectal bleeding since arrival per nursing.  Per nursing pt oriented to self at baseline.   Review of systems:  Limited secondary to dementia   ---- Background on consult:  Grace ReichertMary Zamora is an 84 y.o. female with h/o dementia, DM, Afib, CHF on Coumadin therapy w/ recent rt tibial plateau closed bicondylar fracture being admitted from the [facility] with BRBPR noted in her diaper. SHe was FOBT+ in the ED with  Hb 10.7 and K>7.5 with peaked t-waves and BUN/Cr of 94/4.38 and an INR of >10. BL cr is 1-1.3. No fever noted in the ED and BP was 105-122/58-68 with HR in the 70's, SARS CoV-2 swab pending. Pt given VitK and FFP, D50 + Insulin, CaGluconate and IVF. No h/o obstruction or dysuria but ROS completely not dependable given the pt's advanced dementia. She had also been treated with Lasix and KDur for CHF.   Intake/Output Summary (Last 24 hours) at 07/20/2019 1334 Last data filed at 07/20/2019 0600 Gross per 24 hour  Intake 685.33 ml  Output 3300 ml  Net -2614.67 ml    Vitals:  Vitals:   07/20/19 0900 07/20/19 1000 07/20/19 1002 07/20/19 1100  BP: (!) 130/45 (!) 114/41 (!) 114/41 (!) 97/37  Pulse:  98  94  Resp: 17 13  16   Temp:      TempSrc:      SpO2:  98%  97%     Physical Exam:  General elderly female in bed in no acute distress HEENT normocephalic atraumatic extraocular movements intact sclera anicteric Neck supple trachea midline Lungs clear to auscultation bilaterally normal work of breathing at rest  Heart S1S2; tachycardic, no run  Abdomen soft nontender nondistended Extremities trace lower extremity edema  Psych calm on arrival; agitated with questions which resolves GU foley is in  place Neuro - oriented to person; year is "2"; states "I'm done" when asked our location    Medications reviewed   Labs:  BMP Latest Ref Rng & Units 07/20/2019 07/19/2019 07/19/2019  Glucose 70 - 99 mg/dL 409(W129(H) 119(J132(H) 89  BUN 8 - 23 mg/dL 47(W89(H) 29(F83(H) -  Creatinine 0.44 - 1.00 mg/dL 6.21(H4.03(H) 0.86(V3.94(H) -  Sodium 135 - 145 mmol/L 131(L) 134(L) -  Potassium 3.5 - 5.1 mmol/L 5.3(H) 6.3(HH) 6.9(HH)  Chloride 98 - 111 mmol/L 108 109 -  CO2 22 - 32 mmol/L 16(L) 14(L) -  Calcium 8.9 - 10.3 mg/dL 9.2 9.2 -     Assessment/Plan:   # Hyperkalemia - Resolving with medical measures  - Start bicarb 650 mg TID - Changed to renal diet   # AKI  - Secondary to obstruction and pre-renal  - continue foley catheter  - Resume IV fluids  - With dementia she is not a dialysis candidate  - Improving with supportive care; nonoliguric  # Moderate bilateral hydro - Continue foley  - Consulted urology - appreciate their assistance  - Ordered a CT stone protocol to ensure no obstructing stone   # Hypotension  - Normal saline bolus now and 75 /hr x 12 hours - rate control per primary team - hx a fib and on dilt; normally on metoprolol - was held 7/28 AM for hypotension per nursing   # Dementia  -  oriented to self at baseline per nursing   # Chronic CHF  - Holding lasix for now  - For normal saline  - See that TTE is ordered   # Anemia  - report of BRB per rectum - No acute indication for PRBC's  # A fib  - per primary team   Claudia Desanctis, MD 07/20/2019 1:34 PM

## 2019-07-20 NOTE — Progress Notes (Addendum)
CRITICAL VALUE ALERT  Critical Value:  Potassium 6.3  Date & Time Notied:  07/19/19 and 2300  Provider Notified: K. Schorr  Orders Received/Actions taken: No new orders at this time

## 2019-07-20 NOTE — Progress Notes (Signed)
PT not appropriate at this time for MDI administration. RT will reassess at a later time.

## 2019-07-20 NOTE — Consult Note (Signed)
Consultation Note Date: 07/20/2019   Patient Name: Grace Zamora  DOB: 04-Sep-1935  MRN: [id number]  Age / Sex: 84 y.o., female  PCP: Eloisa NorthernAmin, Saad, MD Referring Physician: Kathlen ModyAkula, Vijaya, MD  Reason for Consultation: Establishing goals of care  HPI/Patient Profile: 84 y.o. female  with past medical history of dementia, a fib, HTN, T2DM, CHF, and CKD admitted on 07/19/2019 with rectal bleeding.  Found to have INR >10 - this has been corrected and no further bleeding. She was also found to have acute renal failure with K >7.5 and creatinine of 4.38 (was 1.41 in early July). She had a recent hospitalization for a fall and leg fracture - was d/c'd to ALF 06/26/19. This admission, renal US was performed and revealed bilateral hydronephrosis and distended bladder. No obstruction of ureters was found. Foley placed and patient now making significant amount of urine. PMT consulted for GOC as patient has multiple comorbidities.   Clinical Assessment and Goals of Care: I have reviewed medical records including EPIC notes, labs and imaging, received report from RN - RN reports patient oriented to self only, minimal PO intake, and continues to make significant amount of urine. Patient is mostly calm, resting - she does become agitated for procedures such as echo earlier today.   I then spoke with patient's daughter, Grace Zamora,  to discuss diagnosis prognosis, GOC, EOL wishes, disposition and options.  I introduced Palliative Medicine as specialized medical care for people living with serious illness. It focuses on providing relief from the symptoms and stress of a serious illness. The goal is to improve quality of life for both the patient and the family.  I attempted to gather information about patient's baseline function, nutrition, and cognition - however, Grace Zamora tells me she is unsure as she has not been able to visit her mother in ~6 months. She tells me that her mother  knows who she is when she calls her.    We discussed her current illness and what it means in the larger context of her on-going co-morbidities.  Natural disease trajectory and expectations at EOL were discussed. Grace Zamora acknowledges that her mother has multiple comorbidities and she tells me she is worried about her. Grace Zamora asks about what is being done for patient's kidneys and we discussed her plan of care. Dialysis was discussed and shared that patient does not seem to be a good candidate for dialysis - Grace Zamora seems to accept this and acknowledges that patient would not do well with this d/t her dementia. She asks about what happens if her kidneys do not improve and she cannot have dialysis - we discussed hope that they will improve and discussed that she is making urine - however, we also discussed that if she declines we would focus on her comfort/quality of life and involve hospice. Grace Zamora seems to accept this as well. We discussed seeing how the patient does in the coming days and continue to develop a plan of care. Grace Zamora is ultimately hopeful that patient can [facility].   Grace Zamora also shares that her siblings are involved with care decisions and she plans to update them - provided her with my number in case any further questions arise. We plan to speak again tomorrow about her mother's condition and plan of care.   Questions and concerns were addressed. The family was encouraged to call with questions or concerns.   Primary Decision Maker NEXT OF KIN - patient's children - it is documented that patient has an HCPOA  but no copy in chart - will discuss further with family     SUMMARY OF RECOMMENDATIONS   - initial [place] conversation - educated about patient condition -discussed many potential scenarios including recovery with return to [facility] vs decline/involving hospice - would likely benefit from palliative outpatient at discharge  Code Status/Advance Care Planning:   DNR  Palliative Prophylaxis:   Delirium Protocol, Frequent Pain Assessment and Turn Reposition  Psycho-social/Spiritual:   Desire for further Chaplaincy support:no  Additional Recommendations: Education on Hospice  Prognosis:   Unable to determine  Discharge Planning: To Be Determined - family hopeful for return to ALF     Primary Diagnoses: Present on Admission: . Acute renal failure (ARF) ([place])   I have reviewed the medical record, interviewed the patient and family, and examined the patient. The following aspects are pertinent.  Past Medical History:  Diagnosis Date  . Atrial fibrillation ([place])   . CHF (congestive heart failure) ([place])   . Diabetes mellitus without complication [facility])    Social History   Socioeconomic History  . Marital status: Widowed    Spouse name: Not on file  . Number of children: Not on file  . Years of education: Not on file  . Highest education level: Not on file  Occupational History  . Not on file  Social Needs  . Financial resource strain: Not on file  . Food insecurity    Worry: Not on file    Inability: Not on file  . Transportation needs    Medical: Not on file    Non-medical: Not on file  Tobacco Use  . Smoking status: Former Research scientist (life sciences)  . Smokeless tobacco: Never Used  Substance and Sexual Activity  . Alcohol use: Not Currently  . Drug use: Not Currently  . Sexual activity: Not Currently  Lifestyle  . Physical activity    Days per week: Not on file    Minutes per session: Not on file  . Stress: Not on file  Relationships  . Social Herbalist on phone: Not on file    Gets together: Not on file    Attends religious service: Not on file    Active member of club or organization: Not on file    Attends meetings of clubs or organizations: Not on file    Relationship status: Not on file  Other Topics Concern  . Not on file  Social History Narrative  . Not on file   History reviewed. No pertinent family  history. Scheduled Meds: . Chlorhexidine Gluconate Cloth  6 each Topical Daily  . cholecalciferol  2,000 Units Oral Daily  . diltiazem  120 mg Oral Daily  . docusate sodium  100 mg Oral BID  . ferrous sulfate  325 mg Oral BID WC  . fluticasone furoate-vilanterol  1 puff Inhalation Daily  . insulin aspart  0-5 Units Subcutaneous QHS  . insulin aspart  0-9 Units Subcutaneous TID WC  . insulin glargine  10 Units Subcutaneous QHS  . memantine  14 mg Oral QPM  . metoprolol tartrate  25 mg Oral BID  . nystatin   Topical BID  . pantoprazole  40 mg Oral Daily  . sodium bicarbonate  650 mg Oral TID  . sodium zirconium cyclosilicate  10 g Oral TID  . traZODone  50 mg Oral QHS   Continuous Infusions: . sodium chloride    . sodium chloride 75 mL/hr at 07/20/19 1421   PRN Meds:.acetaminophen, bisacodyl, camphor-menthol, ipratropium-albuterol,  liver oil-zinc oxide, ondansetron **OR** ondansetron (ZOFRAN) IV, senna-docusate Allergies  Allergen Reactions  . Lisinopril   . Niacin And Related   . Sulfa Antibiotics     Vital Signs: BP 121/62   Pulse (!) 117   Temp (!) 94.4 F (34.7 C) (Core)   Resp 14   SpO2 (!) 89%  Pain Scale: PAINAD   Pain Score: Asleep   SpO2: SpO2: (!) 89 % O2 Device:SpO2: (!) 89 % O2 Flow Rate: .   IO: Intake/output summary:   Intake/Output Summary (Last 24 hours) at 07/20/2019 1615 Last data filed at 07/20/2019 1400 Gross per 24 hour  Intake 976.85 ml  Output 3300 ml  Net -2323.15 ml    LBM: Last BM Date: (unknown) Baseline Weight:   Most recent weight:       Palliative Assessment/Data: PPS 20%    The above conversation was completed via telephone due to the visitor restrictions during the COVID-19 pandemic. Thorough chart review and discussion with necessary members of the care team was completed as part of assessment. All issues were discussed and addressed but no physical exam was performed.  Time Total: 50 minutes Greater than 50%  of this time  was spent counseling and coordinating care related to the above assessment and plan.  Gerlean RenShae Lee Aimee Heldman, DNP, AGNP-C Palliative Medicine Team [phone] Pager: [phone]

## 2019-07-21 DIAGNOSIS — N179 Acute kidney failure, unspecified: Secondary | ICD-10-CM

## 2019-07-21 DIAGNOSIS — K922 Gastrointestinal hemorrhage, unspecified: Secondary | ICD-10-CM

## 2019-07-21 LAB — RENAL FUNCTION PANEL
Albumin: 2.8 g/dL — ABNORMAL LOW (ref 3.5–5.0)
Anion gap: 11 (ref 5–15)
BUN: 59 mg/dL — ABNORMAL HIGH (ref 8–23)
CO2: 16 mmol/L — ABNORMAL LOW (ref 22–32)
Calcium: 8.4 mg/dL — ABNORMAL LOW (ref 8.9–10.3)
Chloride: 110 mmol/L (ref 98–111)
Creatinine, Ser: 2.28 mg/dL — ABNORMAL HIGH (ref 0.44–1.00)
GFR calc Af Amer: 22 mL/min — ABNORMAL LOW (ref >60)
GFR calc non Af Amer: 19 mL/min — ABNORMAL LOW (ref >60)
Glucose, Bld: 92 mg/dL (ref 70–99)
Phosphorus: 3.6 mg/dL (ref 2.5–4.6)
Potassium: 4.1 mmol/L (ref 3.5–5.1)
Sodium: 137 mmol/L (ref 135–145)

## 2019-07-21 LAB — CBC WITH DIFFERENTIAL/PLATELET
Abs Immature Granulocytes: 0.03 10*3/uL (ref 0.00–0.07)
Basophils Absolute: 0 10*3/uL (ref 0.0–0.1)
Basophils Relative: 0 %
Eosinophils Absolute: 0.2 10*3/uL (ref 0.0–0.5)
Eosinophils Relative: 4 %
HCT: 28.4 % — ABNORMAL LOW (ref 36.0–46.0)
Hemoglobin: 8.4 g/dL — ABNORMAL LOW (ref 12.0–15.0)
Immature Granulocytes: 1 %
Lymphocytes Relative: 13 %
Lymphs Abs: 0.9 10*3/uL (ref 0.7–4.0)
MCH: 29 pg (ref 26.0–34.0)
MCHC: 29.6 g/dL — ABNORMAL LOW (ref 30.0–36.0)
MCV: 97.9 fL (ref 80.0–100.0)
Monocytes Absolute: 0.7 10*3/uL (ref 0.1–1.0)
Monocytes Relative: 11 %
Neutro Abs: 4.6 10*3/uL (ref 1.7–7.7)
Neutrophils Relative %: 71 %
Platelets: 192 10*3/uL (ref 150–400)
RBC: 2.9 MIL/uL — ABNORMAL LOW (ref 3.87–5.11)
RDW: 15 % (ref 11.5–15.5)
WBC: 6.5 10*3/uL (ref 4.0–10.5)
nRBC: 0 % (ref 0.0–0.2)

## 2019-07-21 LAB — BLOOD CULTURE ID PANEL (REFLEXED)

## 2019-07-21 LAB — PROTIME-INR
INR: 1.4 — ABNORMAL HIGH (ref 0.8–1.2)
Prothrombin Time: 17.4 seconds — ABNORMAL HIGH (ref 11.4–15.2)

## 2019-07-21 LAB — GLUCOSE, CAPILLARY
Glucose-Capillary: 107 mg/dL — ABNORMAL HIGH (ref 70–99)
Glucose-Capillary: 121 mg/dL — ABNORMAL HIGH (ref 70–99)
Glucose-Capillary: 128 mg/dL — ABNORMAL HIGH (ref 70–99)

## 2019-07-21 MED ORDER — SENNOSIDES-DOCUSATE SODIUM 8.6-50 MG PO TABS
2.0000 | ORAL_TABLET | Freq: Two times a day (BID) | ORAL | Status: DC
  Administered 2019-07-21 – 2019-07-22 (×4): 2 via ORAL
  Filled 2019-07-21 (×4): qty 2

## 2019-07-21 MED ORDER — POLYETHYLENE GLYCOL 3350 17 G PO PACK
17.0000 g | PACK | Freq: Every day | ORAL | Status: DC
  Administered 2019-07-21 – 2019-07-22 (×2): 17 g via ORAL
  Filled 2019-07-21 (×2): qty 1

## 2019-07-21 MED ORDER — ORAL CARE MOUTH RINSE
15.0000 mL | Freq: Two times a day (BID) | OROMUCOSAL | Status: DC
  Administered 2019-07-21 – 2019-07-23 (×4): 15 mL via OROMUCOSAL

## 2019-07-21 MED ORDER — BISACODYL 10 MG RE SUPP
10.0000 mg | Freq: Once | RECTAL | Status: AC
  Administered 2019-07-21: 13:00:00 10 mg via RECTAL
  Filled 2019-07-21: qty 1

## 2019-07-21 MED ORDER — SODIUM CHLORIDE 0.9 % IV SOLN
INTRAVENOUS | Status: AC
  Administered 2019-07-21: 17:00:00 via INTRAVENOUS

## 2019-07-21 NOTE — Progress Notes (Signed)
[facility] Progress Note  Name: Grace Zamora MRN: [id number] DOB: 01-04-1935  Chief Complaint:  Presented with BRB per rectum from her facility   Subjective:  Note palliative care was consulted.  Urology was consulted and recommended leaving the foley in for a minimum of one week prior to a voiding trial.  CT abd with mild to moderate hydro (an improvement)  Review of systems:  Limited secondary to dementia   ---- Background on consult:  Grace Zamora is an 84 y.o. female with h/o dementia, DM, Afib, CHF on Coumadin therapy w/ recent rt tibial plateau closed bicondylar fracture being admitted from the [facility] with BRBPR noted in her diaper. SHe was FOBT+ in the ED with  Hb 10.7 and K>7.5 with peaked t-waves and BUN/Cr of 94/4.38 and an INR of >10. BL cr is 1-1.3. No fever noted in the ED and BP was 105-122/58-68 with HR in the 70's, SARS CoV-2 swab pending. Pt given VitK and FFP, D50 + Insulin, CaGluconate and IVF. No h/o obstruction or dysuria but ROS completely not dependable given the pt's advanced dementia. She had also been treated with Lasix and KDur for CHF.   Intake/Output Summary (Last 24 hours) at 07/21/2019 1540 Last data filed at 07/21/2019 6712 Gross per 24 hour  Intake 730 ml  Output 2200 ml  Net -1470 ml    Vitals:  Vitals:   07/21/19 1200 07/21/19 1300 07/21/19 1438 07/21/19 1519  BP: 112/89 (!) 152/104 (!) 132/100 128/66  Pulse: 87 (!) 101 (!) 101 91  Resp: (!) 21 20 (!) 24 16  Temp:    99.5 F (37.5 C)  TempSrc:    Oral  SpO2: 99% 100% 98% 100%     Physical Exam:  General elderly female in bed in no acute distress HEENT normocephalic atraumatic extraocular movements intact sclera anicteric Neck supple trachea midline Lungs clear to auscultation bilaterally normal work of breathing at rest  Heart S1S2; irregular and tachycardic, no rub Abdomen soft nontender nondistended Extremities no lower extremity edema   Psych calm on exam today  GU foley is in place Neuro - oriented to person only    Medications reviewed   Labs:  BMP Latest Ref Rng & Units 07/21/2019 07/20/2019 07/20/2019  Glucose 70 - 99 mg/dL 92 97 129(H)  BUN 8 - 23 mg/dL 59(H) 71(H) 89(H)  Creatinine 0.44 - 1.00 mg/dL 2.28(H) 2.94(H) 4.03(H)  Sodium 135 - 145 mmol/L 137 136 131(L)  Potassium 3.5 - 5.1 mmol/L 4.1 5.1 5.3(H)  Chloride 98 - 111 mmol/L 110 110 108  CO2 22 - 32 mmol/L 16(L) 19(L) 16(L)  Calcium 8.9 - 10.3 mg/dL 8.4(L) 9.0 9.2     Assessment/Plan:   # Hyperkalemia - Resolving with medical measures  - Discontinued lokelma - renal diet   # AKI  - Secondary to obstruction and pre-renal  - resolving with supportive care  - continue foley catheter  - repeat IV fluids - NS at 75 ml/hr x 12 hours  - With dementia she is not a dialysis candidate   # Moderate bilateral hydro - Continue foley for minimum of one week (placed 7/27 overnight) - Consulted urology - appreciate their assistance  - Will plan for repeat renal ultrasound (ordering tomorrow)   # Hypotension  - Improved  - rate control per primary team  # Dementia  - oriented to self at baseline per nursing   # reported Chronic CHF  - note LV EF 60-65%  and left ventricular diastolic function could not be evaluated - Holding lasix for now   # Anemia  - report of BRB per rectum prior to arrival  - No acute indication for PRBC's  # A fib  - per primary team   Estanislado EmmsLori C Jonahtan Manseau, MD 07/21/2019 3:40 PM

## 2019-07-21 NOTE — Progress Notes (Signed)
PROGRESS NOTE    Grace ReichertMary Barrientez  ZOX:[id number] DOB: 1935/12/07 DOA: 07/19/2019 PCP: Eloisa NorthernAmin, Saad, MD   Brief Narrative:   The patient is a 84 yr old woman who carries a past medical history for dementia, DM II, atrial fibrillation, and CHF. She was recently discharged from [facility] on 06/26/2019 after an admission for a closed bicondylar fracture of the right tibial Plateau. She was discharged to [facility].  She was admitted for evaluation of bright red blood per rectum and AKI.   Assessment & Plan:   Active Problems:   Acute renal failure (ARF) (HCC)   Goals of care, counseling/discussion   Palliative care by specialist   Bright red blood per rectum Appears to have resolved. Probably secondary to coagulopathy and  Sigmoid diverticulosis.  Patient's hemoglobin is around 10 at baseline,  Drop to 8.4 today. Repeat INR. Continue to hold coumadin.  GI consulted to see if she needs sigmoidoscopy.    Acute kidney injury with moderate bilateral hydronephrosis without any evidence of obstructing stone or lesion.   probably secondary to prerenal azotemia and obstruction secondary to neurogenic bladder. In view of her dementia she is not a candidate for dialysis. Foley catheter was placed secondary to retention. Continue with IV fluids and monitor renal parameters.    Hyperkalemia Resolved.   Moderate bilateral hydronephrosis CT of the abdomen/stone protocol showed There is mild to moderate hydronephrosis bilaterally and bilateral perinephric fat stranding, both of which appear slightly increased when compared to CT dated 05/11/2016. There is no obstructing calculus or other lesion identified by noncontrast CT. Urology consulted Plan to continue the Foley catheter for now.  Hypotension Fluid resuscitation to keep map greater than 65.    Chronic diastolic heart heart failure She appears to be compensated at this time.  Restart lasix once creatinine  improves.     Dementia She appears to be at baseline. She is able to tell her name, but is not oriented to place or time.     Insulin-dependent diabetes mellitus Start the patient on sliding scale insulin and long-acting regimen. CBG (last 3)  Recent Labs    07/20/19 0154 07/20/19 1730 07/20/19 2122  GLUCAP 113* 87 94     Chronic atrial fibrillation Rate controlled with diltiazem.  Holding Coumadin for now due to persistent rectal bleeding. Will need further discussion with family regarding anti coagulation with h/o of falls, and GI bleed.   ? Constipation, large stool ball in the rectum  Stool softeners, dulcolax suppository. Manual disimpaction.    COPD No wheezing heard currently on room air.   Right bicondylar tibial plateau fracture status post ORIF on July 1 Weightbearing as tolerated. PT/OT eval will be ordered.  In view of multiple comorbidities and acute renal failure palliative care consulted for goals of care.   DVT prophylaxis: SCDs Code Status: DNR Family Communication: None at bedside, discussed with daughter on the phone on 07/21/2019/  Disposition Plan: Pending clinical improvement  Consultants:   Nephrology  Urology  Gastroenterology Dr Bosie ClosSchooler.    Procedures: CT abdomen/stone protocol  Antimicrobials: None  Subjective: Alert and able to say her name.  Reports not feeling good.  No chest pain or sob.   Objective: Vitals:   07/21/19 0900 07/21/19 0910 07/21/19 0911 07/21/19 1000  BP:  (!) 128/54  (!) 139/113  Pulse: (!) 106 67 (!) 106 72  Resp: 20 18  20   Temp:      TempSrc:  SpO2: 98% 99%  100%    Intake/Output Summary (Last 24 hours) at 07/21/2019 1042 Last data filed at 07/21/2019 6213 Gross per 24 hour  Intake 1021.52 ml  Output 2200 ml  Net -1178.48 ml   There were no vitals filed for this visit.  Examination:  General exam: Appears calm and comfortable , no distress noted.  Respiratory system: Clear to  auscultation. Respiratory effort normal. No wheezing or rhonchi.  Cardiovascular system: S1 & S2 heard, irregular  gastrointestinal system: Abdomen is nondistended, soft and nontender  Normal bowel sounds heard. Central nervous system: alert  And oriented to person only.  Extremities: Right leg externally rotated and tender.  Psychiatry: Cannot be assessed    Data Reviewed: I have personally reviewed following labs and imaging studies  CBC: Recent Labs  Lab 07/19/19 1444 07/20/19 0154 07/21/19 0233  WBC 8.9 11.5* 6.5  NEUTROABS  --  9.7* 4.6  HGB 10.7* 9.4* 8.4*  HCT 35.1* 31.0* 28.4*  MCV 97.2 98.4 97.9  PLT 275 217 192   Basic Metabolic Panel: Recent Labs  Lab 07/19/19 1444 07/19/19 1859 07/19/19 2129 07/20/19 0154 07/20/19 1715 07/21/19 0233  NA 134*  --  134* 131* 136 137  K >7.5* 6.9* 6.3* 5.3* 5.1 4.1  CL 107  --  109 108 110 110  CO2 17*  --  14* 16* 19* 16*  GLUCOSE 111* 89 132* 129* 97 92  BUN 94*  --  83* 89* 71* 59*  CREATININE 4.38*  --  3.94* 4.03* 2.94* 2.28*  CALCIUM 9.2  --  9.2 9.2 9.0 8.4*  MG  --  2.2  --   --   --   --   PHOS  --   --   --   --   --  3.6   GFR: CrCl cannot be calculated (Unknown ideal weight.). Liver Function Tests: Recent Labs  Lab 07/19/19 1444 07/20/19 0154 07/21/19 0233  AST 18 17  --   ALT 10 11  --   ALKPHOS 117 97  --   BILITOT 0.7 0.7  --   PROT 7.8 6.6  --   ALBUMIN 3.7 3.1* 2.8*   No results for input(s): LIPASE, AMYLASE in the last 168 hours. No results for input(s): AMMONIA in the last 168 hours. Coagulation Profile: Recent Labs  Lab 07/19/19 1504 07/20/19 0154  INR >10.0* 2.2*   Cardiac Enzymes: No results for input(s): CKTOTAL, CKMB, CKMBINDEX, TROPONINI in the last 168 hours. BNP (last 3 results) No results for input(s): PROBNP in the last 8760 hours. HbA1C: No results for input(s): HGBA1C in the last 72 hours. CBG: Recent Labs  Lab 07/20/19 0132 07/20/19 0154 07/20/19 1730 07/20/19  2122  GLUCAP <10* 113* 87 94   Lipid Profile: No results for input(s): CHOL, HDL, LDLCALC, TRIG, CHOLHDL, LDLDIRECT in the last 72 hours. Thyroid Function Tests: No results for input(s): TSH, T4TOTAL, FREET4, T3FREE, THYROIDAB in the last 72 hours. Anemia Panel: No results for input(s): VITAMINB12, FOLATE, FERRITIN, TIBC, IRON, RETICCTPCT in the last 72 hours. Sepsis Labs: No results for input(s): PROCALCITON, LATICACIDVEN in the last 168 hours.  Recent Results (from the past 240 hour(s))  Urine culture     Status: Abnormal (Preliminary result)   Collection Time: 07/19/19  3:15 PM   Specimen: Urine, Random  Result Value Ref Range Status   Specimen Description   Final    URINE, RANDOM Performed at [facility], [address] [place],  [place] [address]    Special Requests   Final    NONE Performed at [facility], [place] [address] [place], [place] [address]    Culture (A)  Final    10,000 COLONIES/mL GRAM NEGATIVE RODS IDENTIFICATION AND SUSCEPTIBILITIES TO FOLLOW Performed at [facility], [place] [address], [place], [place] [address]    Report Status PENDING  Incomplete  SARS Coronavirus 2 (CEPHEID - Performed in [facility] lab), Hosp Order     Status: None   Collection Time: 07/19/19  3:45 PM   Specimen: Nasopharyngeal Swab  Result Value Ref Range Status   SARS Coronavirus 2 NEGATIVE NEGATIVE Final    Comment: (NOTE) If result is NEGATIVE SARS-CoV-2 target nucleic acids are NOT DETECTED. The SARS-CoV-2 RNA is generally detectable in upper and lower  respiratory specimens during the acute phase of infection. The lowest  concentration of SARS-CoV-2 viral copies this assay can detect is 250  copies / mL. A negative result does not preclude SARS-CoV-2 infection  and should not be used as the sole basis for treatment or other  patient management decisions.  A negative result may occur with  improper specimen collection /  handling, submission of specimen other  than nasopharyngeal swab, presence of viral mutation(s) within the  areas targeted by this assay, and inadequate number of viral copies  (<250 copies / mL). A negative result must be combined with clinical  observations, patient history, and epidemiological information. If result is POSITIVE SARS-CoV-2 target nucleic acids are DETECTED. The SARS-CoV-2 RNA is generally detectable in upper and lower  respiratory specimens dur ing the acute phase of infection.  Positive  results are indicative of active infection with SARS-CoV-2.  Clinical  correlation with patient history and other diagnostic information is  necessary to determine patient infection status.  Positive results do  not rule out bacterial infection or co-infection with other viruses. If result is PRESUMPTIVE POSTIVE SARS-CoV-2 nucleic acids MAY BE PRESENT.   A presumptive positive result was obtained on the submitted specimen  and confirmed on repeat testing.  While 2019 novel coronavirus  (SARS-CoV-2) nucleic acids may be present in the submitted sample  additional confirmatory testing may be necessary for epidemiological  and / or clinical management purposes  to differentiate between  SARS-CoV-2 and other Sarbecovirus currently known to infect humans.  If clinically indicated additional testing with an alternate test  methodology [phone]) is advised. The SARS-CoV-2 RNA is generally  detectable in upper and lower respiratory sp ecimens during the acute  phase of infection. The expected result is Negative. Fact Sheet for Patients:  StrictlyIdeas.no Fact Sheet for Healthcare Providers: BankingDealers.co.za This test is not yet approved or cleared by the [place] FDA and has been authorized for detection and/or diagnosis of SARS-CoV-2 by FDA under an Emergency Use Authorization (EUA).  This EUA will remain in effect (meaning this  test can be used) for the duration of the COVID-19 declaration under Section 564(b)(1) of the Act, 21 U.S.C. section 360bbb-3(b)(1), unless the authorization is terminated or revoked sooner. Performed at [facility], [place] [address] [place], [place] [address]   Culture, blood (routine x 2)     Status: None (Preliminary result)   Collection Time: 07/19/19  5:23 PM   Specimen: BLOOD  Result Value Ref Range Status   Specimen Description   Final    BLOOD RIGHT ANTECUBITAL Performed at [facility] [address] [place], [place] [address]  Special Requests   Final    BOTTLES DRAWN AEROBIC AND ANAEROBIC Blood Culture adequate volume Performed at [facility], [address], [place], [place] [address]    Culture   Final    NO GROWTH 2 DAYS Performed at [facility], [address] [place], [place] [address]    Report Status PENDING  Incomplete  Culture, blood (routine x 2)     Status: None (Preliminary result)   Collection Time: 07/19/19  6:59 PM   Specimen: BLOOD RIGHT HAND  Result Value Ref Range Status   Specimen Description   Final    BLOOD RIGHT HAND Performed at [facility], [address] [place], [place] [address]    Special Requests   Final    BOTTLES DRAWN AEROBIC ONLY Blood Culture adequate volume Performed at [facility], [address] [place], [place] [address]    Culture  Setup Time   Final    AEROBIC BOTTLE ONLY GRAM POSITIVE COCCI Organism ID to follow CRITICAL RESULT CALLED TO, READ BACK BY AND VERIFIED WITH: Peggyann Juba [facility] 07/21/19 0206 JDW Performed at [facility], [address], [place], [place] [address]    Culture GRAM POSITIVE COCCI  Final   Report Status PENDING  Incomplete  Blood Culture ID Panel (Reflexed)     Status: Abnormal   Collection Time: 07/19/19  6:59 PM  Result Value Ref Range Status   Enterococcus species NOT DETECTED  NOT DETECTED Final   Listeria monocytogenes NOT DETECTED NOT DETECTED Final   Staphylococcus species DETECTED (A) NOT DETECTED Final    Comment: Methicillin (oxacillin) susceptible coagulase negative staphylococcus. Possible blood culture contaminant (unless isolated from more than one blood culture draw or clinical case suggests pathogenicity). No antibiotic treatment is indicated for blood  culture contaminants. CRITICAL RESULT CALLED TO, READ BACK BY AND VERIFIED WITH: Peggyann Juba PHARMD 07/21/19 0206 JDW    Staphylococcus aureus (BCID) NOT DETECTED NOT DETECTED Final   Methicillin resistance NOT DETECTED NOT DETECTED Final   Streptococcus species NOT DETECTED NOT DETECTED Final   Streptococcus agalactiae NOT DETECTED NOT DETECTED Final   Streptococcus pneumoniae NOT DETECTED NOT DETECTED Final   Streptococcus pyogenes NOT DETECTED NOT DETECTED Final   Acinetobacter baumannii NOT DETECTED NOT DETECTED Final   Enterobacteriaceae species NOT DETECTED NOT DETECTED Final   Enterobacter cloacae complex NOT DETECTED NOT DETECTED Final   Escherichia coli NOT DETECTED NOT DETECTED Final   Klebsiella oxytoca NOT DETECTED NOT DETECTED Final   Klebsiella pneumoniae NOT DETECTED NOT DETECTED Final   Proteus species NOT DETECTED NOT DETECTED Final   Serratia marcescens NOT DETECTED NOT DETECTED Final   Haemophilus influenzae NOT DETECTED NOT DETECTED Final   Neisseria meningitidis NOT DETECTED NOT DETECTED Final   Pseudomonas aeruginosa NOT DETECTED NOT DETECTED Final   Candida albicans NOT DETECTED NOT DETECTED Final   Candida glabrata NOT DETECTED NOT DETECTED Final   Candida krusei NOT DETECTED NOT DETECTED Final   Candida parapsilosis NOT DETECTED NOT DETECTED Final   Candida tropicalis NOT DETECTED NOT DETECTED Final    Comment: Performed at [facility], [address], [place], [place] [address]  MRSA PCR Screening     Status: None   Collection Time: 07/19/19  7:45 PM   Specimen:  Nasal Mucosa; Nasopharyngeal  Result Value Ref Range Status   MRSA by PCR NEGATIVE NEGATIVE Final    Comment:  The GeneXpert MRSA Assay (FDA approved for NASAL specimens only), is one component of a comprehensive MRSA colonization surveillance program. It is not intended to diagnose MRSA infection nor to guide or monitor treatment for MRSA infections. Performed at [facility], [address] [place], [place] [id number]          Radiology Studies: [place] Renal  Result Date: 07/19/2019 CLINICAL DATA:  Acute on chronic renal failure EXAM: RENAL / URINARY TRACT ULTRASOUND COMPLETE COMPARISON:  None. FINDINGS: Right Kidney: Renal measurements: 11.4 x 5.0 x 5.8 cm = volume: 173 mL. Poorly visualized. Echogenicity within normal limits. Moderate hydronephrosis. Left Kidney: Renal measurements: 11.2 x 6.3 x 6.5 cm = volume: 239 mL. Poorly visualized. Echogenicity within normal limits. Moderate hydronephrosis. Bladder: Within normal limits.  Bilateral bladder jets are visualized. IMPRESSION: Moderate hydronephrosis bilaterally. Correlate for neurogenic bladder/bladder outlet obstruction. Electronically Signed   By: Charline BillsSriyesh  Krishnan M.D.   On: 07/19/2019 19:25   Ct Renal Stone Study  Result Date: 07/20/2019 CLINICAL DATA:  Confusion, agitation, dementia, acute renal failure, rectal bleeding EXAM: CT ABDOMEN AND PELVIS WITHOUT CONTRAST TECHNIQUE: Multidetector CT imaging of the abdomen and pelvis was performed following the standard protocol without IV contrast. COMPARISON:  Renal ultrasound, 07/19/2019 FINDINGS: Lower chest: Bibasilar scarring or atelectasis. Coronary artery calcifications. Hepatobiliary: No solid liver abnormality is seen. No gallstones, gallbladder wall thickening, or biliary dilatation. Pancreas: Unremarkable. No pancreatic ductal dilatation or surrounding inflammatory changes. Spleen: Normal in size without significant abnormality. Adrenals/Urinary Tract:  Adrenal glands are unremarkable. There is mild to moderate hydronephrosis bilaterally and bilateral perinephric fat stranding, both of which appear slightly increased when compared to CT dated 05/11/2016. There is no obstructing calculus or other lesion identified by noncontrast CT. The urinary bladder is decompressed by a Foley catheter. Stomach/Bowel: Stomach is within normal limits. Sigmoid diverticulosis. Large stool ball in the rectum. Vascular/Lymphatic: Severe aortic atherosclerosis. No enlarged abdominal or pelvic lymph nodes. Reproductive: Status post cholecystectomy. Other: No abdominal wall hernia or abnormality. No abdominopelvic ascites. Musculoskeletal: Left hip total arthroplasty. IMPRESSION: 1. There is mild to moderate hydronephrosis bilaterally and bilateral perinephric fat stranding, both of which appear slightly increased when compared to CT dated 05/11/2016. There is no obstructing calculus or other lesion identified by noncontrast CT. The urinary bladder is decompressed by a Foley catheter. 2. Sigmoid diverticulosis. Large stool ball in the rectum. Correlate for stercoral ulceration given reported history of rectal bleeding. 3. Other chronic, incidental, and postoperative findings as detailed above. Electronically Signed   By: Lauralyn PrimesAlex  Bibbey M.D.   On: 07/20/2019 14:24        Scheduled Meds: . Chlorhexidine Gluconate Cloth  6 each Topical Daily  . cholecalciferol  2,000 Units Oral Daily  . diltiazem  120 mg Oral Daily  . docusate sodium  100 mg Oral BID  . ferrous sulfate  325 mg Oral BID WC  . fluticasone furoate-vilanterol  1 puff Inhalation Daily  . insulin aspart  0-5 Units Subcutaneous QHS  . insulin aspart  0-9 Units Subcutaneous TID WC  . insulin glargine  10 Units Subcutaneous QHS  . mouth rinse  15 mL Mouth Rinse BID  . memantine  14 mg Oral QPM  . metoprolol tartrate  25 mg Oral BID  . nystatin   Topical BID  . pantoprazole  40 mg Oral Daily  . sodium bicarbonate   650 mg Oral TID  . sodium zirconium cyclosilicate  10 g Oral TID  . traZODone  50  mg Oral QHS   Continuous Infusions: . sodium chloride       LOS: 2 days    Time spent: 836 MINUTES    Kathlen ModyVijaya Ahna Konkle, MD [facility] Pager [phone]  If 7PM-7AM, please contact night-coverage [email or web address] Password [email or web address] 07/21/2019, 10:42 AM

## 2019-07-21 NOTE — Consult Note (Signed)
Referring Provider: Dr. Blake DivineAkula Primary Care Physician:  Eloisa NorthernAmin, Saad, MD Primary Gastroenterologist:  UNASSIGNED  Reason for Consultation:  GI bleed  HPI: Grace ReichertMary Zamora is a 84 y.o. female with multiple medical problems including severe dementia, CHF, AFib and DM who was admitted for rectal bleeding reportedly as red blood found in her diaper at her nursing home. She is on chronic Coumadin for Afib and was found to have a supratherapeutic INR greater than 10 on admit that was corrected to 1.4. Hgb 10.7 on admit (07/19/19) and now 8.4. Patient is demented and only able to tell me her name and DOB and is not oriented to place or time. She is not able to give me any history. No rectal bleeding reported since admit. Unknown whether she has had a previous colonoscopy.  Past Medical History:  Diagnosis Date  . Atrial fibrillation (HCC)   . CHF (congestive heart failure) (HCC)   . Diabetes mellitus without complication [facility](HCC)     Past Surgical History:  Procedure Laterality Date  . ORIF TIBIA FRACTURE Right 06/23/2019   Procedure: OPEN REDUCTION INTERNAL FIXATION (ORIF) PROXIMAL TIBIA FRACTURE;  Surgeon: Roby LoftsHaddix, Kevin P, MD;  Location: [facility] OR;  Service: Orthopedics;  Laterality: Right;    Prior to Admission medications   Medication Sig Start Date End Date Taking? Authorizing Provider  acetaminophen (TYLENOL) 500 MG tablet Take 500 mg by mouth 3 (three) times daily.   Yes [provider]  BREO ELLIPTA 100-25 MCG/INH AEPB Inhale 1 puff into the lungs daily. Rinse mouth after use 04/11/19  Yes [provider]  Chloroxylenol-Zinc Oxide (BAZA EX) Apply 1 application topically 3 (three) times daily. Apply after brief chance    Yes [provider]  cholecalciferol (VITAMIN D) 25 MCG (1000 UT) tablet Take 2,000 Units by mouth daily.   Yes [provider]  diltiazem (CARDIZEM CD) 120 MG 24 hr capsule Take 120 mg by mouth daily. 04/17/19  Yes [provider]  donepezil  (ARICEPT) 10 MG tablet Take 10 mg by mouth at bedtime.  04/11/19  Yes [provider]  ferrous sulfate 325 (65 FE) MG tablet Take 325 mg by mouth 2 (two) times daily with a meal.   Yes [provider]  furosemide (LASIX) 40 MG tablet Take 40 mg by mouth 2 (two) times a day. 04/07/19  Yes [provider]  HYDROcodone-acetaminophen (NORCO/VICODIN) 5-325 MG tablet Take 1 tablet by mouth every 6 (six) hours as needed for moderate pain. Patient taking differently: Take 0.5 tablets by mouth as directed. Take 1 tablet by mouth TID for 7 Days and Take 0.5 tablet by mouth every 6 hours PRN for Pain 06/24/19  Yes Despina HiddenYacobi, Sarah A, PA-C  LANTUS SOLOSTAR 100 UNIT/ML Solostar Pen Inject 18 Units into the skin daily. 06/26/19  Yes Almon HerculesGonfa, Taye T, MD  magnesium citrate SOLN Take 1 Bottle by mouth daily as needed for moderate constipation or severe constipation.   Yes [provider]  memantine (NAMENDA XR) 14 MG CP24 24 hr capsule Take 14 mg by mouth every evening.   Yes [provider]  metoprolol tartrate (LOPRESSOR) 25 MG tablet Take 25 mg by mouth 2 (two) times a day. 04/17/19  Yes [provider]  mirtazapine (REMERON) 15 MG tablet Take 7.5 mg by mouth at bedtime.  03/01/19  Yes [provider]  NYSTATIN powder Apply 1 ampule topically daily. 04/12/19  Yes [provider]  omeprazole (PRILOSEC) 20 MG capsule Take 20 mg by  mouth daily. 04/14/19  Yes [provider]  potassium chloride SA (K-DUR) 20 MEQ tablet Take 20 mEq by mouth 2 (two) times daily.  04/17/19  Yes [provider]  Psyllium 400 MG CAPS Take 400 mg by mouth at bedtime.   Yes [provider]  sertraline (ZOLOFT) 25 MG tablet Take 25 mg by mouth daily. 04/07/19  Yes [provider]  traZODone (DESYREL) 50 MG tablet Take 50 mg by mouth at bedtime. 04/17/19  Yes [provider]  trolamine salicylate (ASPERCREME) 10 % cream Apply 1 application  topically 2 (two) times a day.    Yes [provider]  warfarin (COUMADIN) 4 MG tablet Take 1 tablet (4 mg total) by mouth daily at 6 PM for 30 doses. 06/27/19 07/27/19 Yes Gonfa, Charlesetta Ivory, MD  ipratropium-albuterol (DUONEB) 0.5-2.5 (3) MG/3ML SOLN Take 3 mLs by nebulization every 2 (two) hours as needed (Shortness of breath).    [provider]  Menthol-Zinc Oxide (CALMOSEPTINE) 0.44-20.6 % OINT Apply 1 application topically every 8 (eight) hours as needed (itching).    [provider]  senna-docusate (SENOKOT-S) 8.6-50 MG tablet Take 1 tablet by mouth 3 (three) times a week. MWF    [provider]    Scheduled Meds: . Chlorhexidine Gluconate Cloth  6 each Topical Daily  . cholecalciferol  2,000 Units Oral Daily  . diltiazem  120 mg Oral Daily  . docusate sodium  100 mg Oral BID  . ferrous sulfate  325 mg Oral BID WC  . fluticasone furoate-vilanterol  1 puff Inhalation Daily  . insulin aspart  0-5 Units Subcutaneous QHS  . insulin aspart  0-9 Units Subcutaneous TID WC  . insulin glargine  10 Units Subcutaneous QHS  . mouth rinse  15 mL Mouth Rinse BID  . memantine  14 mg Oral QPM  . metoprolol tartrate  25 mg Oral BID  . nystatin   Topical BID  . pantoprazole  40 mg Oral Daily  . polyethylene glycol  17 g Oral Daily  . senna-docusate  2 tablet Oral BID  . sodium bicarbonate  650 mg Oral TID  . traZODone  50 mg Oral QHS   Continuous Infusions: . sodium chloride    . sodium chloride     PRN Meds:.acetaminophen, camphor-menthol, ipratropium-albuterol, liver oil-zinc oxide, ondansetron **OR** ondansetron (ZOFRAN) IV  Allergies as of 07/19/2019 - Review Complete 07/19/2019  Allergen Reaction Noted  . Lisinopril  04/25/2019  . Niacin and related  04/25/2019  . Sulfa antibiotics  04/25/2019    History reviewed. No pertinent family history.  Social History   Socioeconomic History  . Marital status: Widowed    Spouse name: Not on file  . Number of  children: Not on file  . Years of education: Not on file  . Highest education level: Not on file  Occupational History  . Not on file  Social Needs  . Financial resource strain: Not on file  . Food insecurity    Worry: Not on file    Inability: Not on file  . Transportation needs    Medical: Not on file    Non-medical: Not on file  Tobacco Use  . Smoking status: Former Research scientist (life sciences)  . Smokeless tobacco: Never Used  Substance and Sexual Activity  . Alcohol use: Not Currently  . Drug use: Not Currently  . Sexual activity: Not Currently  Lifestyle  . Physical activity    Days per week: Not on file  Minutes per session: Not on file  . Stress: Not on file  Relationships  . Social Musicianconnections    Talks on phone: Not on file    Gets together: Not on file    Attends religious service: Not on file    Active member of club or organization: Not on file    Attends meetings of clubs or organizations: Not on file    Relationship status: Not on file  . Intimate partner violence    Fear of current or ex partner: Not on file    Emotionally abused: Not on file    Physically abused: Not on file    Forced sexual activity: Not on file  Other Topics Concern  . Not on file  Social History Narrative  . Not on file    Review of Systems: All negative except as stated above in HPI.  Physical Exam: Vital signs: Vitals:   07/21/19 1438 07/21/19 1519  BP: (!) 132/100 128/66  Pulse: (!) 101 91  Resp: (!) 24 16  Temp:  99.5 F (37.5 C)  SpO2: 98% 100%   Last BM Date: 07/21/19 General:   Demented, lethargic, elderly, frail, well-nourished, no acute distress  Head: normocephalic, atraumatic Eyes: anicteric sclera ENT: oropharynx clear Neck: supple, nontender Lungs:  Clear throughout to auscultation.   No wheezes, crackles, or rhonchi. No acute distress. Heart:  Regular rate and rhythm; no murmurs, clicks, rubs,  or gallops. Abdomen: diffuse tenderness with guarding, soft, nondistended, +BS   Rectal:  Deferred Ext: no edema  GI:  Lab Results: Recent Labs    07/19/19 1444 07/20/19 0154 07/21/19 0233  WBC 8.9 11.5* 6.5  HGB 10.7* 9.4* 8.4*  HCT 35.1* 31.0* 28.4*  PLT 275 217 192   BMET Recent Labs    07/20/19 0154 07/20/19 1715 07/21/19 0233  NA 131* 136 137  K 5.3* 5.1 4.1  CL 108 110 110  CO2 16* 19* 16*  GLUCOSE 129* 97 92  BUN 89* 71* 59*  CREATININE 4.03* 2.94* 2.28*  CALCIUM 9.2 9.0 8.4*   LFT Recent Labs    07/20/19 0154 07/21/19 0233  PROT 6.6  --   ALBUMIN 3.1* 2.8*  AST 17  --   ALT 11  --   ALKPHOS 97  --   BILITOT 0.7  --    PT/INR Recent Labs    07/20/19 0154 07/21/19 1107  LABPROT 24.2* 17.4*  INR 2.2* 1.4*     Studies/Results: [place] Renal  Result Date: 07/19/2019 CLINICAL DATA:  Acute on chronic renal failure EXAM: RENAL / URINARY TRACT ULTRASOUND COMPLETE COMPARISON:  None. FINDINGS: Right Kidney: Renal measurements: 11.4 x 5.0 x 5.8 cm = volume: 173 mL. Poorly visualized. Echogenicity within normal limits. Moderate hydronephrosis. Left Kidney: Renal measurements: 11.2 x 6.3 x 6.5 cm = volume: 239 mL. Poorly visualized. Echogenicity within normal limits. Moderate hydronephrosis. Bladder: Within normal limits.  Bilateral bladder jets are visualized. IMPRESSION: Moderate hydronephrosis bilaterally. Correlate for neurogenic bladder/bladder outlet obstruction. Electronically Signed   By: Charline BillsSriyesh  Krishnan M.D.   On: 07/19/2019 19:25   Ct Renal Stone Study  Result Date: 07/20/2019 CLINICAL DATA:  Confusion, agitation, dementia, acute renal failure, rectal bleeding EXAM: CT ABDOMEN AND PELVIS WITHOUT CONTRAST TECHNIQUE: Multidetector CT imaging of the abdomen and pelvis was performed following the standard protocol without IV contrast. COMPARISON:  Renal ultrasound, 07/19/2019 FINDINGS: Lower chest: Bibasilar scarring or atelectasis. Coronary artery calcifications. Hepatobiliary: No solid liver abnormality is seen. No gallstones,  gallbladder wall  thickening, or biliary dilatation. Pancreas: Unremarkable. No pancreatic ductal dilatation or surrounding inflammatory changes. Spleen: Normal in size without significant abnormality. Adrenals/Urinary Tract: Adrenal glands are unremarkable. There is mild to moderate hydronephrosis bilaterally and bilateral perinephric fat stranding, both of which appear slightly increased when compared to CT dated 05/11/2016. There is no obstructing calculus or other lesion identified by noncontrast CT. The urinary bladder is decompressed by a Foley catheter. Stomach/Bowel: Stomach is within normal limits. Sigmoid diverticulosis. Large stool ball in the rectum. Vascular/Lymphatic: Severe aortic atherosclerosis. No enlarged abdominal or pelvic lymph nodes. Reproductive: Status post cholecystectomy. Other: No abdominal wall hernia or abnormality. No abdominopelvic ascites. Musculoskeletal: Left hip total arthroplasty. IMPRESSION: 1. There is mild to moderate hydronephrosis bilaterally and bilateral perinephric fat stranding, both of which appear slightly increased when compared to CT dated 05/11/2016. There is no obstructing calculus or other lesion identified by noncontrast CT. The urinary bladder is decompressed by a Foley catheter. 2. Sigmoid diverticulosis. Large stool ball in the rectum. Correlate for stercoral ulceration given reported history of rectal bleeding. 3. Other chronic, incidental, and postoperative findings as detailed above. Electronically Signed   By: Lauralyn PrimesAlex  Bibbey M.D.   On: 07/20/2019 14:24    Impression/Plan: 84 yo with CHF, DM, AFib and advanced dementia seen for a consult due to an episode of rectal bleeding in the setting of a supratherapeutic INR. Since correction of the INR no rectal bleeding since admit. Due to her multiple comorbidities would NOT recommend any invasive GI endoscopic procedures and would manage conservatively. Hopefully, anticoagulation is not needed at discharge with  her high fall risk but defer to cardiology and hospitalist. Will sign off. Call if questions.    LOS: 2 days   Shirley FriarVincent C Ainsleigh Kakos  07/21/2019, 5:07 PM  Questions please call [phone]

## 2019-07-21 NOTE — Progress Notes (Signed)
PHARMACY - PHYSICIAN COMMUNICATION CRITICAL VALUE ALERT - BLOOD CULTURE IDENTIFICATION (BCID)  Grace Zamora is an 85 y.o. female who presented to [facility] on 07/19/2019 with a chief complaint of BRBPR and AKI  Assessment:  Pt now with possible contamination noted in BCID (include suspected source if known)  Name of physician (or Provider) Contacted: K. Schorr  Current antibiotics: none  Changes to prescribed antibiotics recommended:  Consider redraw cultures, await final results for current cultures,  and other measures as AM rounding team wishes to address.  Results for orders placed or performed during the hospital encounter of 07/19/19  Blood Culture ID Panel (Reflexed) (Collected: 07/19/2019  6:59 PM)  Result Value Ref Range   Enterococcus species NOT DETECTED NOT DETECTED   Listeria monocytogenes NOT DETECTED NOT DETECTED   Staphylococcus species DETECTED (A) NOT DETECTED   Staphylococcus aureus (BCID) NOT DETECTED NOT DETECTED   Methicillin resistance NOT DETECTED NOT DETECTED   Streptococcus species NOT DETECTED NOT DETECTED   Streptococcus agalactiae NOT DETECTED NOT DETECTED   Streptococcus pneumoniae NOT DETECTED NOT DETECTED   Streptococcus pyogenes NOT DETECTED NOT DETECTED   Acinetobacter baumannii NOT DETECTED NOT DETECTED   Enterobacteriaceae species NOT DETECTED NOT DETECTED   Enterobacter cloacae complex NOT DETECTED NOT DETECTED   Escherichia coli NOT DETECTED NOT DETECTED   Klebsiella oxytoca NOT DETECTED NOT DETECTED   Klebsiella pneumoniae NOT DETECTED NOT DETECTED   Proteus species NOT DETECTED NOT DETECTED   Serratia marcescens NOT DETECTED NOT DETECTED   Haemophilus influenzae NOT DETECTED NOT DETECTED   Neisseria meningitidis NOT DETECTED NOT DETECTED   Pseudomonas aeruginosa NOT DETECTED NOT DETECTED   Candida albicans NOT DETECTED NOT DETECTED   Candida glabrata NOT DETECTED NOT DETECTED   Candida krusei NOT DETECTED NOT DETECTED   Candida  parapsilosis NOT DETECTED NOT DETECTED   Candida tropicalis NOT DETECTED NOT DETECTED    Grace Zamora 07/21/2019  5:11 AM

## 2019-07-21 NOTE — Progress Notes (Signed)
Daily Progress Note   Patient Name: Grace Zamora       Date: 07/21/2019 DOB: 07-Mar-1935  Age: 84 y.o. MRN#: [id number] Attending Physician: Hosie Poisson, MD Primary Care Physician: Garwin Brothers, MD Admit Date: 07/19/2019  Reason for Consultation/Follow-up: Establishing goals of care  Subjective: Drop in Hgb, GI consulted - remains confused  Length of Stay: 2  Current Medications: Scheduled Meds:  . Chlorhexidine Gluconate Cloth  6 each Topical Daily  . cholecalciferol  2,000 Units Oral Daily  . diltiazem  120 mg Oral Daily  . docusate sodium  100 mg Oral BID  . ferrous sulfate  325 mg Oral BID WC  . fluticasone furoate-vilanterol  1 puff Inhalation Daily  . insulin aspart  0-5 Units Subcutaneous QHS  . insulin aspart  0-9 Units Subcutaneous TID WC  . insulin glargine  10 Units Subcutaneous QHS  . mouth rinse  15 mL Mouth Rinse BID  . memantine  14 mg Oral QPM  . metoprolol tartrate  25 mg Oral BID  . nystatin   Topical BID  . pantoprazole  40 mg Oral Daily  . polyethylene glycol  17 g Oral Daily  . senna-docusate  2 tablet Oral BID  . sodium bicarbonate  650 mg Oral TID  . sodium zirconium cyclosilicate  10 g Oral TID  . traZODone  50 mg Oral QHS    Continuous Infusions: . sodium chloride      PRN Meds: acetaminophen, camphor-menthol, ipratropium-albuterol, liver oil-zinc oxide, ondansetron **OR** ondansetron (ZOFRAN) IV     Vital Signs: BP (!) 139/113   Pulse 72   Temp 98.7 F (37.1 C) (Oral)   Resp 20   SpO2 100%  SpO2: SpO2: 100 % O2 Device: O2 Device: Room Air O2 Flow Rate:    Intake/output summary:   Intake/Output Summary (Last 24 hours) at 07/21/2019 1405 Last data filed at 07/21/2019 6063 Gross per 24 hour  Intake 730 ml  Output 2200 ml  Net -1470 ml   LBM: Last BM  Date: (PTA) Baseline Weight:   Most recent weight:         Palliative Assessment/Data: PPS 20%    Flowsheet Rows     Most Recent Value  Intake Tab  Referral Department  Hospitalist  Unit at Time of Referral  Intermediate Care Unit  Palliative Care Primary Diagnosis  Nephrology  Date Notified  07/20/19  Palliative Care Type  New Palliative care  Reason for referral  Clarify Goals of Care  Date of Admission  07/19/19  Date first seen by Palliative Care  07/20/19  # of days Palliative referral response time  0 Day(s)  # of days IP prior to Palliative referral  1  Clinical Assessment  Palliative Performance Scale Score  20%  Psychosocial & Spiritual Assessment  Palliative Care Outcomes  Patient/Family meeting held?  Yes  Who was at the meeting?  daughter  [place] goals of care, Counseled regarding hospice, Provided psychosocial or spiritual support      Patient Active Problem List   Diagnosis Date Noted  . Goals of care, counseling/discussion   . Palliative care by specialist   . Acute renal failure (ARF) ([facility])  07/19/2019  . Closed bicondylar fracture of right tibial plateau 06/24/2019  . Fall at home, initial encounter 06/21/2019  . Atrial fibrillation, chronic 06/21/2019  . Essential hypertension 06/21/2019  . Alzheimer's dementia (HCC) 06/21/2019  . Type 2 diabetes mellitus (HCC) 06/21/2019    Palliative Care Assessment & Plan   HPI: 84 y.o. female  with past medical history of dementia, a fib, HTN, T2DM, CHF, and CKD admitted on 07/19/2019 with rectal bleeding.  Found to have INR >10 - this has been corrected and no further bleeding. She was also found to have acute renal failure with K >7.5 and creatinine of 4.38 (was 1.41 in early July). She had a recent hospitalization for a fall and leg fracture - was d/c'd to ALF 06/26/19. This admission, renal US was performed and revealed bilateral hydronephrosis and distended bladder. No obstruction of  ureters was found. Foley placed and patient now making significant amount of urine. PMT consulted for GOC as patient has multiple comorbidities.   Assessment: Follow up call with patient's daughter, Kendal HymenBonnie - she was overwhelmed and tearful at the start of our conversation - she tells me she spoke with MD this morning and understands concerns about GI bleed and involving GI doctor. She is also concerned about patient's mental status.   She also tells me patient's kidneys are worse - we discussed that her creatinine/BUN had improved and patient continues to make urine - she is slightly encouraged by this.  We also discussed delirium in the hospital setting is likely contributing to her AMS. We discussed that this is reversible - also discussed that her dementia is not, it is progressive and only time will tell what her new baseline will be. Kendal HymenBonnie expresses understanding to this.  She has included her siblings in the conversations and they have no additional questions. Kendal HymenBonnie tells me that she is HCPOA and has dcumentation. She wanted to confirm that patient is listed as DNR - confirmed this.  Lastly we discussed patient's long term plan - again discussed progressive, chronic nature of dementia - discussed involving palliative at discharge to continue to support [facility] throughout patient's illness - discussed at some point may be appropriate to involve hospice in patient's care - Kendal HymenBonnie tells me she is overwhelmed by this and tells me she needs time to think about it and would like to ask further questions about this tomorrow.  Recommendations/Plan:  Updates provided, questions addressed  Clarified that daughter Kendal HymenBonnie is HCPOA - tells me she has documentation, this is not on file  Discussed involving palliative at discharge and transition to hospice as patient declines  Will f/u again tomorrow  Code Status:  DNR  Prognosis:   Unable to determine  Discharge Planning:  To Be  Determined - depends on hospital course - [facility] to [facility]  Care plan was discussed with patient's daughter, Kendal HymenBonnie  Thank you for allowing the Palliative Medicine Team to assist in the care of this patient.   Total Time 35 minutes Prolonged Time Billed  no    The above conversation was completed via telephone due to the visitor restrictions during the COVID-19 pandemic. Thorough chart review and discussion with necessary members of the care team was completed as part of assessment. All issues were discussed and addressed but no physical exam was performed.    Greater than 50%  of this time was spent counseling and coordinating care related to the above assessment and plan.  Juel Burrow, DNP, [facility] Palliative Medicine Team Team Phone # [phone]  Pager [phone]

## 2019-07-21 NOTE — Evaluation (Signed)
Physical Therapy Evaluation Patient Details Name: Carlyn ReichertMary Levay MRN: [id number] DOB: 07/11/1935 Today's Date: 07/21/2019   History of Present Illness  84 yo female admitted with acute renal failure. Recent hx of ORIF R tibial plateau fx 06/23/19. Past medical hx: dementia, Afib, CHF, DM  Clinical Impression  Bed level eval only. Pt follows 1 step commands inconsistently. She is resistant to movement unless it is of her own volition. Per chart, pt is from an ALF. Unsure of level of assistance that was provided at the facility. At this time, recommendation is for SNF unless her ALF can manage pt's care in her current condition. Will follow.     Follow Up Recommendations SNF    Equipment Recommendations  None recommended by PT    Recommendations for Other Services       Precautions / Restrictions Precautions Precautions: Fall Restrictions Weight Bearing Restrictions: No Other Position/Activity Restrictions: WBAT R LE per prior PT progress note      Mobility  Bed Mobility Overal bed mobility: Needs Assistance Bed Mobility: Rolling Rolling: Total assist;+2 for physical assistance;+2 for safety/equipment         General bed mobility comments: Rolling towards L side partially. Pt refused to roll to R.  Transfers                    Ambulation/Gait                Stairs            Wheelchair Mobility    Modified Rankin (Stroke Patients Only)       Balance Overall balance assessment: History of Falls                                           Pertinent Vitals/Pain Pain Assessment: Faces Faces Pain Scale: No hurt    Home Living Family/patient expects to be discharged to:: Unsure     Type of Home: Assisted living                Prior Function           Comments: unsure - patient is a poor historian     Hand Dominance        Extremity/Trunk Assessment   Upper Extremity Assessment Upper Extremity Assessment:  Generalized weakness;Difficult to assess due to impaired cognition    Lower Extremity Assessment Lower Extremity Assessment: Difficult to assess due to impaired cognition       Communication   Communication: No difficulties  Cognition Arousal/Alertness: Awake/alert Behavior During Therapy: Restless Overall Cognitive Status: History of cognitive impairments - at baseline                                 General Comments: dementia      General Comments      Exercises     Assessment/Plan    PT Assessment Patient needs continued PT services  PT Problem List Decreased strength;Decreased mobility;Decreased activity tolerance;Decreased balance;Decreased range of motion;Decreased cognition;Decreased knowledge of use of DME;Pain;Decreased knowledge of precautions       PT Treatment Interventions DME instruction;Therapeutic activities;Therapeutic exercise;Patient/family education;Functional mobility training;Balance training    PT Goals (Current goals can be found in the Care Plan section)  Acute Rehab PT Goals Patient Stated Goal: none stated-pt unable PT Goal Formulation:  Patient unable to participate in goal setting Time For Goal Achievement: 08/04/19 Potential to Achieve Goals: Poor    Frequency Min 2X/week   Barriers to discharge        Co-evaluation               AM-PAC PT "6 Clicks" Mobility  Outcome Measure Help needed turning from your back to your side while in a flat bed without using bedrails?: Total Help needed moving from lying on your back to sitting on the side of a flat bed without using bedrails?: Total Help needed moving to and from a bed to a chair (including a wheelchair)?: Total Help needed standing up from a chair using your arms (e.g., wheelchair or bedside chair)?: Total Help needed to walk in hospital room?: Total Help needed climbing 3-5 steps with a railing? : Total 6 Click Score: 6    End of Session   Activity  Tolerance: Patient tolerated treatment well Patient left: in bed;with call bell/phone within reach;with bed alarm set   PT Visit Diagnosis: History of falling (Z91.81);Difficulty in walking, not elsewhere classified (R26.2);Muscle weakness (generalized) (M62.81)    Time: 1287-8676 PT Time Calculation (min) (ACUTE ONLY): 8 min   Charges:   PT Evaluation $PT Eval Moderate Complexity: [place], PT Acute Rehabilitation Services Pager: [phone] Office: [phone]

## 2019-07-22 ENCOUNTER — Other Ambulatory Visit: Payer: Self-pay

## 2019-07-22 LAB — URINE CULTURE: Culture: 10000 — AB

## 2019-07-22 LAB — RENAL FUNCTION PANEL
Albumin: 2.8 g/dL — ABNORMAL LOW (ref 3.5–5.0)
Anion gap: 8 (ref 5–15)
BUN: 31 mg/dL — ABNORMAL HIGH (ref 8–23)
CO2: 19 mmol/L — ABNORMAL LOW (ref 22–32)
Calcium: 8.3 mg/dL — ABNORMAL LOW (ref 8.9–10.3)
Chloride: 114 mmol/L — ABNORMAL HIGH (ref 98–111)
Creatinine, Ser: 1.37 mg/dL — ABNORMAL HIGH (ref 0.44–1.00)
GFR calc Af Amer: 42 mL/min — ABNORMAL LOW (ref >60)
GFR calc non Af Amer: 36 mL/min — ABNORMAL LOW (ref >60)
Glucose, Bld: 101 mg/dL — ABNORMAL HIGH (ref 70–99)
Phosphorus: 2.6 mg/dL (ref 2.5–4.6)
Potassium: 3.2 mmol/L — ABNORMAL LOW (ref 3.5–5.1)
Sodium: 141 mmol/L (ref 135–145)

## 2019-07-22 LAB — GLUCOSE, CAPILLARY
Glucose-Capillary: 90 mg/dL (ref 70–99)
Glucose-Capillary: 97 mg/dL (ref 70–99)
Glucose-Capillary: 103 mg/dL — ABNORMAL HIGH (ref 70–99)
Glucose-Capillary: 97 mg/dL (ref 70–99)
Glucose-Capillary: 94 mg/dL (ref 70–99)

## 2019-07-22 LAB — PREPARE FRESH FROZEN PLASMA
Unit division: 0
Unit division: 0

## 2019-07-22 LAB — CBC WITH DIFFERENTIAL/PLATELET
Abs Immature Granulocytes: 0.03 10*3/uL (ref 0.00–0.07)
Basophils Absolute: 0 10*3/uL (ref 0.0–0.1)
Basophils Relative: 0 %
Eosinophils Absolute: 0.1 10*3/uL (ref 0.0–0.5)
Eosinophils Relative: 3 %
HCT: 26.9 % — ABNORMAL LOW (ref 36.0–46.0)
Hemoglobin: 8.3 g/dL — ABNORMAL LOW (ref 12.0–15.0)
Immature Granulocytes: 1 %
Lymphocytes Relative: 16 %
Lymphs Abs: 0.8 10*3/uL (ref 0.7–4.0)
MCH: 29.9 pg (ref 26.0–34.0)
MCHC: 30.9 g/dL (ref 30.0–36.0)
MCV: 96.8 fL (ref 80.0–100.0)
Monocytes Absolute: 0.6 10*3/uL (ref 0.1–1.0)
Monocytes Relative: 12 %
Neutro Abs: 3.4 10*3/uL (ref 1.7–7.7)
Neutrophils Relative %: 68 %
Platelets: 184 10*3/uL (ref 150–400)
RBC: 2.78 MIL/uL — ABNORMAL LOW (ref 3.87–5.11)
RDW: 14.9 % (ref 11.5–15.5)
WBC: 5 10*3/uL (ref 4.0–10.5)
nRBC: 0 % (ref 0.0–0.2)

## 2019-07-22 LAB — CULTURE, BLOOD (ROUTINE X 2): Special Requests: ADEQUATE

## 2019-07-22 LAB — HEMOGLOBIN AND HEMATOCRIT, BLOOD
HCT: 27.6 % — ABNORMAL LOW (ref 36.0–46.0)
Hemoglobin: 8.4 g/dL — ABNORMAL LOW (ref 12.0–15.0)

## 2019-07-22 LAB — BPAM FFP
Blood Product Expiration Date: 202008012359
Blood Product Expiration Date: 202008012359
ISSUE DATE / TIME: 202007272133
Unit Type and Rh: 5100
Unit Type and Rh: 5100

## 2019-07-22 MED ORDER — POTASSIUM CHLORIDE CRYS ER 20 MEQ PO TBCR
40.0000 meq | EXTENDED_RELEASE_TABLET | Freq: Two times a day (BID) | ORAL | Status: DC
  Administered 2019-07-22: 40 meq via ORAL
  Filled 2019-07-22: qty 2

## 2019-07-22 MED ORDER — SODIUM CHLORIDE 0.9 % IV SOLN
INTRAVENOUS | Status: AC
  Administered 2019-07-22: 16:00:00 via INTRAVENOUS

## 2019-07-22 NOTE — Care Management Important Message (Signed)
Important Message  Patient Details IM Letter given to Rhea Pink RN to present to the Patient Name: Grace Zamora MRN: [id number] Date of Birth: [birth year]   Medicare Important Message Given:  Yes     Kerin Salen 07/22/2019, 12:14 PM

## 2019-07-22 NOTE — Progress Notes (Signed)
Daily Progress Note   Patient Name: Grace Zamora       Date: 07/22/2019 DOB: 01-Apr-1935  Age: 84 y.o. MRN#: [id number] Attending Physician: Hosie Poisson, MD Primary Care Physician: Garwin Brothers, MD Admit Date: 07/19/2019  Reason for Consultation/Follow-up: Establishing goals of care  Subjective: Lg dk stool this AM, eating some, remains confused, minimal work with PT  Length of Stay: 3  Current Medications: Scheduled Meds:  . Chlorhexidine Gluconate Cloth  6 each Topical Daily  . cholecalciferol  2,000 Units Oral Daily  . diltiazem  120 mg Oral Daily  . docusate sodium  100 mg Oral BID  . ferrous sulfate  325 mg Oral BID WC  . fluticasone furoate-vilanterol  1 puff Inhalation Daily  . insulin aspart  0-5 Units Subcutaneous QHS  . insulin aspart  0-9 Units Subcutaneous TID WC  . insulin glargine  10 Units Subcutaneous QHS  . mouth rinse  15 mL Mouth Rinse BID  . memantine  14 mg Oral QPM  . metoprolol tartrate  25 mg Oral BID  . nystatin   Topical BID  . pantoprazole  40 mg Oral Daily  . polyethylene glycol  17 g Oral Daily  . potassium chloride  40 mEq Oral BID  . senna-docusate  2 tablet Oral BID  . sodium bicarbonate  650 mg Oral TID  . traZODone  50 mg Oral QHS    Continuous Infusions: . sodium chloride      PRN Meds: acetaminophen, camphor-menthol, ipratropium-albuterol, liver oil-zinc oxide, ondansetron **OR** ondansetron (ZOFRAN) IV     Vital Signs: BP 129/75   Pulse 67   Temp 99.9 F (37.7 C) (Oral)   Resp 14   Ht 5\' 4"  (1.626 m)   Wt 79.1 kg   SpO2 100%   BMI 29.93 kg/m  SpO2: SpO2: 100 % O2 Device: O2 Device: Room Air O2 Flow Rate:    Intake/output summary:   Intake/Output Summary (Last 24 hours) at 07/22/2019 1441 Last data filed at 07/22/2019 0955 Gross per 24  hour  Intake 1695.46 ml  Output 1225 ml  Net 470.46 ml   LBM: Last BM Date: 07/22/19 Baseline Weight: Weight: 75.6 kg Most recent weight: Weight: 79.1 kg       Palliative Assessment/Data: PPS 20%    Flowsheet Rows     Most Recent Value  Intake Tab  Referral Department  Hospitalist  Unit at Time of Referral  Intermediate Care Unit  Palliative Care Primary Diagnosis  Nephrology  Date Notified  07/20/19  Palliative Care Type  New Palliative care  Reason for referral  Clarify Goals of Care  Date of Admission  07/19/19  Date first seen by Palliative Care  07/20/19  # of days Palliative referral response time  0 Day(s)  # of days IP prior to Palliative referral  1  Clinical Assessment  Palliative Performance Scale Score  20%  Psychosocial & Spiritual Assessment  Palliative Care Outcomes  Patient/Family meeting held?  Yes  Who was at the meeting?  daughter  [place] goals of care, Counseled regarding hospice, Provided psychosocial or spiritual support      Patient Active Problem List   Diagnosis Date  Noted  . AKI (acute kidney injury) (HCC)   . Gastrointestinal hemorrhage   . Goals of care, counseling/discussion   . Palliative care by specialist   . Acute renal failure (ARF) (HCC) 07/19/2019  . Closed bicondylar fracture of right tibial plateau 06/24/2019  . Fall at home, initial encounter 06/21/2019  . Atrial fibrillation, chronic 06/21/2019  . Essential hypertension 06/21/2019  . Alzheimer's dementia (HCC) 06/21/2019  . Type 2 diabetes mellitus (HCC) 06/21/2019    Palliative Care Assessment & Plan   HPI: 84 y.o. female  with past medical history of dementia, a fib, HTN, T2DM, CHF, and CKD admitted on 07/19/2019 with rectal bleeding.  Found to have INR >10 - this has been corrected and no further bleeding. She was also found to have acute renal failure with K >7.5 and creatinine of 4.38 (was 1.41 in early July). She had a recent  hospitalization for a fall and leg fracture - was d/c'd to ALF 06/26/19. This admission, renal US was performed and revealed bilateral hydronephrosis and distended bladder. No obstruction of ureters was found. Foley placed and patient now making significant amount of urine. PMT consulted for GOC as patient has multiple comorbidities.   Assessment: Called and spoke with daughter Kendal HymenBonnie again today - discussed clinical condition - Kendal HymenBonnie concerned about dk stool this morning. Discussed anticoagulation - Kendal HymenBonnie agrees with MD that stopping anticoagulation is best. Discussed improvement in kidney function.   Discussed broader condition of patient - advanced dementia with multiple comorbidities - Kendal HymenBonnie has good understanding that her mother is ill and frail. We discussed her goals of care moving forward including hospice care. At this point, Kendal HymenBonnie asked to include her brother Trey PaulaJeff in conversation.  Trey PaulaJeff was merged into the call - we reviewed clinical condition again and all questions addressed. We this discussed overall goals of care - Trey PaulaJeff and Kendal HymenBonnie both agree they would like to transition to focus more on quality of life/comfort and avoid aggressive medical interventions. Discussed hospice philosophy of care - they both agree they would like to pursue hospice care moving forward. [facility].  Discussed with social work and Dr. Blake DivineAkula.   Recommendations/Plan:  Updates provided, questions addressed  [facility] - social work and Dr. Blake DivineAkula aware  Code Status:  DNR  Prognosis:   < 6 months - dementia FAST 7c  Discharge Planning:  ALF with hospice  Care plan was discussed with patient's daughter, Kendal HymenBonnie  Thank you for allowing the Palliative Medicine Team to assist in the care of this patient.   Total Time 50 minutes Prolonged Time Billed  no    The above conversation was  completed via telephone due to the visitor restrictions during the COVID-19 pandemic. Thorough chart review and discussion with necessary members of the care team was completed as part of assessment. All issues were discussed and addressed but no physical exam was performed.    Greater than 50%  of this time was spent counseling and coordinating care related to the above assessment and plan.  Gerlean RenShae Lee Melaya Hoselton, DNP, [facility] Palliative Medicine Team Team Phone # [phone]  Pager [phone]

## 2019-07-22 NOTE — Progress Notes (Signed)
PROGRESS NOTE    Grace ReichertMary Zamora  WUJ:[id number] DOB: 11/16/1935 DOA: 07/19/2019 PCP: Grace NorthernAmin, Saad, MD   Brief Narrative:   The patient is a 84 yr old woman who carries a past medical history for dementia, DM II, atrial fibrillation, and CHF. She was recently discharged from [facility] on 06/26/2019 after an admission for a closed bicondylar fracture of the right tibial Plateau. [facility].  She was admitted for evaluation of bright red blood per rectum and AKI.   Assessment & Plan:   Active Problems:   Acute renal failure (ARF) (HCC)   Goals of care, counseling/discussion   Palliative care by specialist   AKI (acute kidney injury) (HCC)   Gastrointestinal hemorrhage   Bright red blood per rectum Appears to have resolved. Probably secondary to coagulopathy and  Sigmoid diverticulosis.  Patient's hemoglobin is around 10 at baseline,  Drop to 8.3 today. Repeat INR today. Continue to hold coumadin. Pt had a huge melanotic bloody bowel movement and GI consulted, suggested not a candidate for invasive endoscopic procedures.  Repeat H&H and transfuse if patient's daughter wants aggressive management.    Acute kidney injury with moderate bilateral hydronephrosis without any evidence of obstructing stone or lesion.   probably secondary to prerenal azotemia and obstruction secondary to neurogenic bladder. In view of her dementia she is not a candidate for dialysis. Foley catheter was placed secondary to retention. Continue with IV fluids and monitor renal parameters. Improving with IV fluids.     Hyperkalemia Resolved.   Moderate bilateral hydronephrosis CT of the abdomen/stone protocol showed There is mild to moderate hydronephrosis bilaterally and bilateral perinephric fat stranding, both of which appear slightly increased when compared to CT dated 05/11/2016. There is no obstructing calculus or other lesion identified by noncontrast CT.  Urology consulted Plan to continue the Foley catheter for now.  Hypotension Fluid resuscitation to keep map greater than 65.    Chronic diastolic heart heart failure She appears to be compensated at this time.  Restart lasix once creatinine improves.     Dementia She appears to be at baseline. She is able to tell her name, but is not oriented to place or time.     Insulin-dependent diabetes mellitus Start the patient on sliding scale insulin and long-acting regimen. CBG (last 3)  Recent Labs    07/21/19 2222 07/22/19 0732 07/22/19 1154  GLUCAP 128* 97 103*     Chronic atrial fibrillation Rate controlled with diltiazem.  Holding Coumadin for now due to persistent rectal bleeding. Will need further discussion with family regarding anti coagulation with h/o of falls, and GI bleed.   ? Constipation, large stool ball in the rectum  Stool softeners, dulcolax suppository. Manual disimpaction.    COPD No wheezing heard currently on room air.   Right bicondylar tibial plateau fracture status post ORIF on July 1 Weightbearing as tolerated. PT/OT eval will be ordered.  In view of multiple comorbidities and acute renal failure palliative care consulted for goals of care. Daughter at this time plan to transition to ALF with hospice services.    DVT prophylaxis: SCDs Code Status: DNR Family Communication: None at bedside, discussed with daughter on the phone on 07/22/2019/  Disposition Plan: Pending clinical improvement  Consultants:   Nephrology  Urology  Gastroenterology Dr Bosie ClosSchooler.    Procedures: CT abdomen/stone protocol  Antimicrobials: None  Subjective: Alert and able to say her name.  Bloody melanotic stool today.   Objective:  Vitals:   07/22/19 0509 07/22/19 0600 07/22/19 1018 07/22/19 1338  BP: 126/67  125/68 129/75  Pulse: 88  86 67  Resp: 14     Temp: (!) 97.5 F (36.4 C)   99.9 F (37.7 C)  TempSrc: Oral   Oral  SpO2: 95%   100%  Weight:   79.1 kg    Height:  5\' 4"  (1.626 m)      Intake/Output Summary (Last 24 hours) at 07/22/2019 1555 Last data filed at 07/22/2019 0955 Gross per 24 hour  Intake 1695.46 ml  Output 1225 ml  Net 470.46 ml   Filed Weights   07/21/19 1717 07/22/19 0600  Weight: 75.6 kg 79.1 kg    Examination:  General exam: Appears calm and comfortable , no distress noted.  Respiratory system: Clear to auscultation. Respiratory effort normal. No wheezing or rhonchi.  Cardiovascular system: S1 & S2 heard, irregular  gastrointestinal system: Abdomen is nondistended, soft and nontender  Normal bowel sounds heard. Central nervous system: alert  And oriented to person only.  Extremities: Right leg externally rotated and tender.  Psychiatry: Cannot be assessed    Data Reviewed: I have personally reviewed following labs and imaging studies  CBC: Recent Labs  Lab 07/19/19 1444 07/20/19 0154 07/21/19 0233 07/22/19 0520  WBC 8.9 11.5* 6.5 5.0  NEUTROABS  --  9.7* 4.6 3.4  HGB 10.7* 9.4* 8.4* 8.3*  HCT 35.1* 31.0* 28.4* 26.9*  MCV 97.2 98.4 97.9 96.8  PLT 275 217 192 184   Basic Metabolic Panel: Recent Labs  Lab 07/19/19 1859 07/19/19 2129 07/20/19 0154 07/20/19 1715 07/21/19 0233 07/22/19 0520  NA  --  134* 131* 136 137 141  K 6.9* 6.3* 5.3* 5.1 4.1 3.2*  CL  --  109 108 110 110 114*  CO2  --  14* 16* 19* 16* 19*  GLUCOSE 89 132* 129* 97 92 101*  BUN  --  83* 89* 71* 59* 31*  CREATININE  --  3.94* 4.03* 2.94* 2.28* 1.37*  CALCIUM  --  9.2 9.2 9.0 8.4* 8.3*  MG 2.2  --   --   --   --   --   PHOS  --   --   --   --  3.6 2.6   GFR: Estimated Creatinine Clearance: 32.2 mL/min (A) (by C-G formula based on SCr of 1.37 mg/dL (H)). Liver Function Tests: Recent Labs  Lab 07/19/19 1444 07/20/19 0154 07/21/19 0233 07/22/19 0520  AST 18 17  --   --   ALT 10 11  --   --   ALKPHOS 117 97  --   --   BILITOT 0.7 0.7  --   --   PROT 7.8 6.6  --   --   ALBUMIN 3.7 3.1* 2.8* 2.8*   No  results for input(s): LIPASE, AMYLASE in the last 168 hours. No results for input(s): AMMONIA in the last 168 hours. Coagulation Profile: Recent Labs  Lab 07/19/19 1504 07/20/19 0154 07/21/19 1107  INR >10.0* 2.2* 1.4*   Cardiac Enzymes: No results for input(s): CKTOTAL, CKMB, CKMBINDEX, TROPONINI in the last 168 hours. BNP (last 3 results) No results for input(s): PROBNP in the last 8760 hours. HbA1C: No results for input(s): HGBA1C in the last 72 hours. CBG: Recent Labs  Lab 07/21/19 1135 07/21/19 1609 07/21/19 2222 07/22/19 0732 07/22/19 1154  GLUCAP 107* 121* 128* 97 103*   Lipid Profile: No results for input(s): CHOL, HDL, LDLCALC, TRIG, CHOLHDL, LDLDIRECT in  the last 72 hours. Thyroid Function Tests: No results for input(s): TSH, T4TOTAL, FREET4, T3FREE, THYROIDAB in the last 72 hours. Anemia Panel: No results for input(s): VITAMINB12, FOLATE, FERRITIN, TIBC, IRON, RETICCTPCT in the last 72 hours. Sepsis Labs: No results for input(s): PROCALCITON, LATICACIDVEN in the last 168 hours.  Recent Results (from the past 240 hour(s))  Urine culture     Status: Abnormal   Collection Time: 07/19/19  3:15 PM   Specimen: Urine, Random  Result Value Ref Range Status   Specimen Description   Final    URINE, RANDOM Performed at [facility] [address] [place], [place] [address]    Special Requests   Final    NONE Performed at [facility], [place] [address], [place], [place] [address]    Culture 10,000 COLONIES/mL PROTEUS MIRABILIS (A)  Final   Report Status 07/22/2019 FINAL  Final   Organism ID, Bacteria PROTEUS MIRABILIS (A)  Final      Susceptibility   Proteus mirabilis - MIC*    AMPICILLIN <=2 SENSITIVE Sensitive     CEFAZOLIN 8 SENSITIVE Sensitive     CEFTRIAXONE <=1 SENSITIVE Sensitive     CIPROFLOXACIN >=4 RESISTANT Resistant     GENTAMICIN 8 INTERMEDIATE Intermediate     IMIPENEM 2 SENSITIVE Sensitive      NITROFURANTOIN 128 RESISTANT Resistant     TRIMETH/SULFA >=320 RESISTANT Resistant     AMPICILLIN/SULBACTAM <=2 SENSITIVE Sensitive     PIP/TAZO <=4 SENSITIVE Sensitive     * 10,000 COLONIES/mL PROTEUS MIRABILIS  SARS Coronavirus 2 (CEPHEID - Performed in [place] hospital lab), Hosp Order     Status: None   Collection Time: 07/19/19  3:45 PM   Specimen: Nasopharyngeal Swab  Result Value Ref Range Status   SARS Coronavirus 2 NEGATIVE NEGATIVE Final    Comment: (NOTE) If result is NEGATIVE SARS-CoV-2 target nucleic acids are NOT DETECTED. The SARS-CoV-2 RNA is generally detectable in upper and lower  respiratory specimens during the acute phase of infection. The lowest  concentration of SARS-CoV-2 viral copies this assay can detect is 250  copies / mL. A negative result does not preclude SARS-CoV-2 infection  and should not be used as the sole basis for treatment or other  patient management decisions.  A negative result may occur with  improper specimen collection / handling, submission of specimen other  than nasopharyngeal swab, presence of viral mutation(s) within the  areas targeted by this assay, and inadequate number of viral copies  (<250 copies / mL). A negative result must be combined with clinical  observations, patient history, and epidemiological information. If result is POSITIVE SARS-CoV-2 target nucleic acids are DETECTED. The SARS-CoV-2 RNA is generally detectable in upper and lower  respiratory specimens dur ing the acute phase of infection.  Positive  results are indicative of active infection with SARS-CoV-2.  Clinical  correlation with patient history and other diagnostic information is  necessary to determine patient infection status.  Positive results do  not rule out bacterial infection or co-infection with other viruses. If result is PRESUMPTIVE POSTIVE SARS-CoV-2 nucleic acids MAY BE PRESENT.   A presumptive positive result was obtained on the submitted  specimen  and confirmed on repeat testing.  While 2019 novel coronavirus  (SARS-CoV-2) nucleic acids may be present in the submitted sample  additional confirmatory testing may be necessary for epidemiological  and / or clinical management purposes  to differentiate between  SARS-CoV-2 and other Sarbecovirus currently known  to infect humans.  If clinically indicated additional testing with an alternate test  methodology [phone]) is advised. The SARS-CoV-2 RNA is generally  detectable in upper and lower respiratory sp ecimens during the acute  phase of infection. The expected result is Negative. Fact Sheet for Patients:  BoilerBrush.com.cy Fact Sheet for Healthcare Providers: https://pope.com/ This test is not yet approved or cleared by the [place] FDA and has been authorized for detection and/or diagnosis of SARS-CoV-2 by FDA under an Emergency Use Authorization (EUA).  This EUA will remain in effect (meaning this test can be used) for the duration of the COVID-19 declaration under Section 564(b)(1) of the Act, 21 U.S.C. section 360bbb-3(b)(1), unless the authorization is terminated or revoked sooner. Performed at [facility], [address], [place], [place] [address]   Culture, blood (routine x 2)     Status: None (Preliminary result)   Collection Time: 07/19/19  5:23 PM   Specimen: BLOOD  Result Value Ref Range Status   Specimen Description   Final    BLOOD RIGHT ANTECUBITAL Performed at [facility], [address], [place], [place] [address]    Special Requests   Final    BOTTLES DRAWN AEROBIC AND ANAEROBIC Blood Culture adequate volume Performed at [facility], [address], [place], [place] [address]    Culture   Final    NO GROWTH 3 DAYS Performed at [facility], [address] [place], [place] [address]    Report Status PENDING  Incomplete   Culture, blood (routine x 2)     Status: Abnormal   Collection Time: 07/19/19  6:59 PM   Specimen: BLOOD RIGHT HAND  Result Value Ref Range Status   Specimen Description   Final    BLOOD RIGHT HAND Performed at [facility], [address] [place], [place] [address]    Special Requests   Final    BOTTLES DRAWN AEROBIC ONLY Blood Culture adequate volume Performed at [facility], [address] [place], [place] [address]    Culture  Setup Time   Final    AEROBIC BOTTLE ONLY GRAM POSITIVE COCCI CRITICAL RESULT CALLED TO, READ BACK BY AND VERIFIED WITH: J GRIMSLEY [facility] 07/21/19 0206 JDW    Culture (A)  Final    STAPHYLOCOCCUS SPECIES (COAGULASE NEGATIVE) THE SIGNIFICANCE OF ISOLATING THIS ORGANISM FROM A SINGLE SET OF BLOOD CULTURES WHEN MULTIPLE SETS ARE DRAWN IS UNCERTAIN. PLEASE NOTIFY THE MICROBIOLOGY DEPARTMENT WITHIN ONE WEEK IF SPECIATION AND SENSITIVITIES ARE REQUIRED. Performed at [facility], [address], [place], [place] [address]    Report Status 07/22/2019 FINAL  Final  Blood Culture ID Panel (Reflexed)     Status: Abnormal   Collection Time: 07/19/19  6:59 PM  Result Value Ref Range Status   Enterococcus species NOT DETECTED NOT DETECTED Final   Listeria monocytogenes NOT DETECTED NOT DETECTED Final   Staphylococcus species DETECTED (A) NOT DETECTED Final    Comment: Methicillin (oxacillin) susceptible coagulase negative staphylococcus. Possible blood culture contaminant (unless isolated from more than one blood culture draw or clinical case suggests pathogenicity). No antibiotic treatment is indicated for blood  culture contaminants. CRITICAL RESULT CALLED TO, READ BACK BY AND VERIFIED WITH: J GRIMSLEY PHARMD 07/21/19 0206 JDW    Staphylococcus aureus (BCID) NOT DETECTED NOT DETECTED Final   Methicillin resistance NOT DETECTED NOT DETECTED Final   Streptococcus species NOT DETECTED NOT DETECTED Final   Streptococcus  agalactiae NOT DETECTED NOT DETECTED Final   Streptococcus pneumoniae NOT DETECTED NOT DETECTED Final   Streptococcus pyogenes NOT DETECTED NOT DETECTED Final   Acinetobacter baumannii NOT DETECTED NOT DETECTED Final   Enterobacteriaceae species NOT DETECTED NOT DETECTED Final   Enterobacter cloacae complex NOT DETECTED NOT DETECTED Final   Escherichia coli NOT DETECTED NOT DETECTED Final   Klebsiella oxytoca NOT DETECTED NOT DETECTED Final   Klebsiella pneumoniae NOT DETECTED NOT DETECTED Final   Proteus species NOT DETECTED NOT DETECTED Final   Serratia marcescens NOT DETECTED NOT DETECTED Final   Haemophilus influenzae NOT DETECTED NOT DETECTED Final   Neisseria meningitidis NOT DETECTED NOT DETECTED Final   Pseudomonas aeruginosa NOT DETECTED NOT DETECTED Final   Candida albicans NOT DETECTED NOT DETECTED Final   Candida glabrata NOT DETECTED NOT DETECTED Final   Candida krusei NOT DETECTED NOT DETECTED Final   Candida parapsilosis NOT DETECTED NOT DETECTED Final   Candida tropicalis NOT DETECTED NOT DETECTED Final    Comment: Performed at [facility], [address] [place], [place] [address]  MRSA PCR Screening     Status: None   Collection Time: 07/19/19  7:45 PM   Specimen: Nasal Mucosa; Nasopharyngeal  Result Value Ref Range Status   MRSA by PCR NEGATIVE NEGATIVE Final    Comment:        The GeneXpert MRSA Assay (FDA approved for NASAL specimens only), is one component of a comprehensive MRSA colonization surveillance program. It is not intended to diagnose MRSA infection nor to guide or monitor treatment for MRSA infections. Performed at [facility], [address] [place], [place] [address]          Radiology Studies: No results found.      Scheduled Meds: . Chlorhexidine Gluconate Cloth  6 each Topical Daily  . cholecalciferol  2,000 Units Oral Daily  . diltiazem  120 mg Oral Daily  . docusate sodium  100 mg Oral BID  .  ferrous sulfate  325 mg Oral BID WC  . fluticasone furoate-vilanterol  1 puff Inhalation Daily  . insulin aspart  0-5 Units Subcutaneous QHS  . insulin aspart  0-9 Units Subcutaneous TID WC  . insulin glargine  10 Units Subcutaneous QHS  . mouth rinse  15 mL Mouth Rinse BID  . memantine  14 mg Oral QPM  . metoprolol tartrate  25 mg Oral BID  . nystatin   Topical BID  . pantoprazole  40 mg Oral Daily  . polyethylene glycol  17 g Oral Daily  . senna-docusate  2 tablet Oral BID  . traZODone  50 mg Oral QHS   Continuous Infusions: . sodium chloride    . sodium chloride       LOS: 3 days    Time spent: 8138 MINUTES    Kathlen ModyVijaya Roslind Michaux, MD [facility] Pager [phone]  If 7PM-7AM, please contact night-coverage [email or web address] Password [email or web address] 07/22/2019, 3:55 PM

## 2019-07-22 NOTE — Progress Notes (Signed)
[facility] Progress Note  Name: Grace ReichertMary Zamora MRN: [id number] DOB: Dec 20, 1935  Chief Complaint:  Presented with BRB per rectum from her facility   Subjective:  Palliative care has followed and her son and daughter have elected to avoid aggressive interventions and proceed with a more comfort focused approach.  Spoke with her nurse - she had a large black stool today.   Review of systems:  Limited secondary to dementia   ---- Background on consult:  Grace ReichertMary Zamora is an 84 y.o. female with h/o dementia, DM, Afib, CHF on Coumadin therapy w/ recent rt tibial plateau closed bicondylar fracture being admitted from the [facility] with BRBPR noted in her diaper. SHe was FOBT+ in the ED with  Hb 10.7 and K>7.5 with peaked t-waves and BUN/Cr of 94/4.38 and an INR of >10. BL cr is 1-1.3. No fever noted in the ED and BP was 105-122/58-68 with HR in the 70's, SARS CoV-2 swab pending. Pt given VitK and FFP, D50 + Insulin, CaGluconate and IVF. No h/o obstruction or dysuria but ROS completely not dependable given the pt's advanced dementia. She had also been treated with Lasix and KDur for CHF.   Intake/Output Summary (Last 24 hours) at 07/22/2019 1507 Last data filed at 07/22/2019 0955 Gross per 24 hour  Intake 1695.46 ml  Output 1225 ml  Net 470.46 ml    Vitals:  Vitals:   07/22/19 0509 07/22/19 0600 07/22/19 1018 07/22/19 1338  BP: 126/67  125/68 129/75  Pulse: 88  86 67  Resp: 14     Temp: (!) 97.5 F (36.4 C)   99.9 F (37.7 C)  TempSrc: Oral   Oral  SpO2: 95%   100%  Weight:  79.1 kg    Height:  5\' 4"  (1.626 m)       Physical Exam:  General elderly female in bed in no acute distress  HEENT normocephalic atraumatic extraocular movements intact sclera anicteric Neck supple trachea midline Lungs clear to auscultation bilaterally normal work of breathing at rest  Heart S1S2; irregular and tachycardic, no rub Abdomen soft nontender  nondistended Extremities no lower extremity edema  Psych calm on exam today  GU foley is in place Neuro - oriented to person only, not location or time   Medications reviewed   Labs:  BMP Latest Ref Rng & Units 07/22/2019 07/21/2019 07/20/2019  Glucose 70 - 99 mg/dL 409(W101(H) 92 97  BUN 8 - 23 mg/dL 11(B31(H) 14(N59(H) 82(N71(H)  Creatinine 0.44 - 1.00 mg/dL 5.62(Z1.37(H) 3.08(M2.28(H) 5.78(I2.94(H)  Sodium 135 - 145 mmol/L 141 137 136  Potassium 3.5 - 5.1 mmol/L 3.2(L) 4.1 5.1  Chloride 98 - 111 mmol/L 114(H) 110 110  CO2 22 - 32 mmol/L 19(L) 16(L) 19(L)  Calcium 8.9 - 10.3 mg/dL 8.3(L) 8.4(L) 9.0     Assessment/Plan:   # Hyperkalemia - Resolved with medical measures  - Discontinue bicarb   # AKI  - Secondary to obstruction and pre-renal  - resolving with supportive care  - continue foley catheter as below - With dementia she is not a dialysis candidate   # Moderate bilateral hydro - Continue foley for minimum of one week (placed 7/27 overnight) per urology consult.  She may ultimately need an indwelling foley  - Called mild to moderate on repeat CT imaging  - Repeat renal ultrasound per primary team discretion - note goals of care.  Urology recommended waiting at least 48 hours from foley placement 7/27    #  Hypotension  - resolved   # Dementia  - oriented to self at baseline per nursing   # reported Chronic CHF  - note LV EF 60-65% and left ventricular diastolic function could not be evaluated - lasix has been held  - Compensated on exam.   # Anemia  - report of BRB per rectum prior to arrival  - No acute indication for PRBC's  # A fib  - per primary team   AKI resolving and hyperkalemia resolved. Will sign off.  Please do not hesitate to contact me with any questions   Claudia Desanctis, MD 07/22/2019 3:07 PM

## 2019-07-22 NOTE — TOC Progression Note (Signed)
Transition of Care [facility]) - Progression Note    Patient Details  Name: Grace Zamora MRN: [id number] Date of Birth: 1935-01-26  [facility]) CM/SW [place], LCSW Phone Number: 07/22/2019, 2:35 PM  Clinical Narrative:   CSW received phone call from Palliative team stating that patient will need to dc back to [facility] with Hospice following. CSW relayed information to [facility] and they stated  as long as orders for Hospice is on discharge summary they will be able to accommodate patient at facility.    Expected Discharge Plan: Assisted Living Barriers to Discharge: Continued Medical Work up  Expected Discharge Plan and Services Expected Discharge Plan: Assisted Living In-house Referral: Clinical Social Work     Living arrangements for the past 2 months: [place]                                       Social Determinants of Health (SDOH) Interventions    Readmission Risk Interventions No flowsheet data found.

## 2019-07-22 NOTE — NC FL2 (Signed)
[place] LEVEL OF CARE SCREENING TOOL     IDENTIFICATION  Patient Name: Grace Zamora Birthdate: [birth year] Sex: female Admission Date (Current Location): 07/19/2019  [facility] and [place] Number:  Herbalist and Address:  [facility],  [place] [address] [place]      Provider Number: [id number]  Attending Physician Name and Address:  Hosie Poisson, MD  Relative Name and Phone Number:  Horris Latino XNATF,[phone]    Current Level of Care: Hospital Recommended Level of Care: Assisted Living Facility([facility]) Prior Approval Number:    Date Approved/Denied:   PASRR Number:    Discharge Plan: Other (Comment)([facility])    Current Diagnoses: Patient Active Problem List   Diagnosis Date Noted  . AKI (acute kidney injury) ([place])   . Gastrointestinal hemorrhage   . Goals of care, counseling/discussion   . Palliative care by specialist   . Acute renal failure (ARF) ([place]) 07/19/2019  . Closed bicondylar fracture of right tibial plateau 06/24/2019  . Fall at home, initial encounter 06/21/2019  . Atrial fibrillation, chronic 06/21/2019  . Essential hypertension 06/21/2019  . Alzheimer's dementia ([place]) 06/21/2019  . Type 2 diabetes mellitus ([place]) 06/21/2019    Orientation RESPIRATION BLADDER Height & Weight     (responds to voice)  Normal Continent, Indwelling catheter Weight: 174 lb 6.1 oz (79.1 kg) Height:  5\' 4"  (162.6 cm)  BEHAVIORAL SYMPTOMS/MOOD NEUROLOGICAL BOWEL NUTRITION STATUS      Incontinent Diet(soft)  AMBULATORY STATUS COMMUNICATION OF NEEDS Skin   Extensive Assist Verbally                         Personal Care Assistance Level of Assistance  Bathing, Feeding, Dressing Bathing Assistance: Maximum assistance Feeding assistance: Limited assistance Dressing Assistance: Maximum assistance     Functional Limitations Info  Sight, Hearing, Speech Sight Info: Adequate Hearing Info:  Adequate Speech Info: Impaired    SPECIAL CARE FACTORS FREQUENCY  PT (By licensed PT), OT (By licensed OT)     PT Frequency: [facility]x [facility] OT Frequency: 3x [facility]            Contractures Contractures Info: Not present    Additional Factors Info  Code Status, Allergies Code Status Info: DNR Allergies Info: Lisinopril Niacin And Related Sulfa Antibiotics           Current Medications (07/22/2019):  This is the current hospital active medication list Current Facility-Administered Medications  Medication Dose Route Frequency Provider Last Rate Last Dose  . 0.9 %  sodium chloride infusion  10 mL/hr Intravenous Once Hosie Poisson, MD      . acetaminophen (TYLENOL) tablet 650 mg  650 mg Oral Q4H PRN Hosie Poisson, MD   650 mg at 07/20/19 2146  . camphor-menthol (SARNA) lotion   Topical Q8H PRN Hosie Poisson, MD      . Chlorhexidine Gluconate Cloth 2 % PADS 6 each  6 each Topical Daily Hosie Poisson, MD   6 each at 07/22/19 1019  . cholecalciferol (VITAMIN D) tablet 2,000 Units  2,000 Units Oral Daily Hosie Poisson, MD   2,000 Units at 07/22/19 1018  . diltiazem (CARDIZEM CD) 24 hr capsule 120 mg  120 mg Oral Daily Hosie Poisson, MD   120 mg at 07/22/19 1018  . docusate sodium (COLACE) capsule 100 mg  100 mg Oral BID Hosie Poisson, MD   100 mg at 07/22/19 1018  .  ferrous sulfate tablet 325 mg  325 mg Oral BID WC Kathlen ModyAkula, Vijaya, MD   325 mg at 07/22/19 0751  . fluticasone furoate-vilanterol (BREO ELLIPTA) 100-25 MCG/INH 1 puff  1 puff Inhalation Daily Kathlen ModyAkula, Vijaya, MD   1 puff at 07/21/19 0851  . insulin aspart (novoLOG) injection 0-5 Units  0-5 Units Subcutaneous QHS Kathlen ModyAkula, Vijaya, MD      . insulin aspart (novoLOG) injection 0-9 Units  0-9 Units Subcutaneous TID WC Kathlen ModyAkula, Vijaya, MD   1 Units at 07/21/19 1722  . insulin glargine (LANTUS) injection 10 Units  10 Units Subcutaneous QHS Kathlen ModyAkula, Vijaya, MD   10 Units at 07/21/19 2250  . ipratropium-albuterol (DUONEB)  0.5-2.5 (3) MG/3ML nebulizer solution 3 mL  3 mL Nebulization Q2H PRN Kathlen ModyAkula, Vijaya, MD      . liver oil-zinc oxide (DESITIN) 40 % ointment   Topical PRN Kathlen ModyAkula, Vijaya, MD      . MEDLINE mouth rinse  15 mL Mouth Rinse BID Kathlen ModyAkula, Vijaya, MD   15 mL at 07/22/19 1019  . memantine (NAMENDA XR) 24 hr capsule 14 mg  14 mg Oral QPM Kathlen ModyAkula, Vijaya, MD   14 mg at 07/21/19 1733  . metoprolol tartrate (LOPRESSOR) tablet 25 mg  25 mg Oral BID Kathlen ModyAkula, Vijaya, MD   25 mg at 07/22/19 1018  . nystatin (MYCOSTATIN/NYSTOP) topical powder   Topical BID Kathlen ModyAkula, Vijaya, MD      . ondansetron (ZOFRAN) tablet 4 mg  4 mg Oral Q6H PRN Kathlen ModyAkula, Vijaya, MD       Or  . ondansetron (ZOFRAN) injection 4 mg  4 mg Intravenous Q6H PRN Kathlen ModyAkula, Vijaya, MD      . pantoprazole (PROTONIX) EC tablet 40 mg  40 mg Oral Daily Kathlen ModyAkula, Vijaya, MD   40 mg at 07/22/19 1018  . polyethylene glycol (MIRALAX / GLYCOLAX) packet 17 g  17 g Oral Daily Kathlen ModyAkula, Vijaya, MD   17 g at 07/22/19 1018  . potassium chloride SA (K-DUR) CR tablet 40 mEq  40 mEq Oral BID Kathlen ModyAkula, Vijaya, MD   40 mEq at 07/22/19 1019  . senna-docusate (Senokot-S) tablet 2 tablet  2 tablet Oral BID Kathlen ModyAkula, Vijaya, MD   2 tablet at 07/22/19 1018  . sodium bicarbonate tablet 650 mg  650 mg Oral TID Kathlen ModyAkula, Vijaya, MD   650 mg at 07/22/19 1018  . traZODone (DESYREL) tablet 50 mg  50 mg Oral QHS Kathlen ModyAkula, Vijaya, MD   50 mg at 07/21/19 2123     Discharge Medications: Please see discharge summary for a list of discharge medications.  Relevant Imaging Results:  Relevant Lab Results:   Additional Information SS# [id number] hospice to follow at facility  Althea CharonAshley C Kohle Winner, LCSW

## 2019-07-23 LAB — CBC WITH DIFFERENTIAL/PLATELET
Abs Immature Granulocytes: 0.05 10*3/uL (ref 0.00–0.07)
Basophils Absolute: 0 10*3/uL (ref 0.0–0.1)
Basophils Relative: 0 %
Eosinophils Absolute: 0.2 10*3/uL (ref 0.0–0.5)
Eosinophils Relative: 3 %
HCT: 27 % — ABNORMAL LOW (ref 36.0–46.0)
Hemoglobin: 8.2 g/dL — ABNORMAL LOW (ref 12.0–15.0)
Immature Granulocytes: 1 %
Lymphocytes Relative: 15 %
Lymphs Abs: 0.9 10*3/uL (ref 0.7–4.0)
MCH: 30.1 pg (ref 26.0–34.0)
MCHC: 30.4 g/dL (ref 30.0–36.0)
MCV: 99.3 fL (ref 80.0–100.0)
Monocytes Absolute: 0.7 10*3/uL (ref 0.1–1.0)
Monocytes Relative: 12 %
Neutro Abs: 4.1 10*3/uL (ref 1.7–7.7)
Neutrophils Relative %: 69 %
Platelets: 173 10*3/uL (ref 150–400)
RBC: 2.72 MIL/uL — ABNORMAL LOW (ref 3.87–5.11)
RDW: 15.2 % (ref 11.5–15.5)
WBC: 5.9 10*3/uL (ref 4.0–10.5)
nRBC: 0 % (ref 0.0–0.2)

## 2019-07-23 LAB — RENAL FUNCTION PANEL
Albumin: 2.7 g/dL — ABNORMAL LOW (ref 3.5–5.0)
Anion gap: 7 (ref 5–15)
BUN: 19 mg/dL (ref 8–23)
CO2: 22 mmol/L (ref 22–32)
Calcium: 8.4 mg/dL — ABNORMAL LOW (ref 8.9–10.3)
Chloride: 111 mmol/L (ref 98–111)
Creatinine, Ser: 1.13 mg/dL — ABNORMAL HIGH (ref 0.44–1.00)
GFR calc Af Amer: 52 mL/min — ABNORMAL LOW (ref >60)
GFR calc non Af Amer: 45 mL/min — ABNORMAL LOW (ref >60)
Glucose, Bld: 83 mg/dL (ref 70–99)
Phosphorus: 2.4 mg/dL — ABNORMAL LOW (ref 2.5–4.6)
Potassium: 3.6 mmol/L (ref 3.5–5.1)
Sodium: 140 mmol/L (ref 135–145)

## 2019-07-23 LAB — GLUCOSE, CAPILLARY
Glucose-Capillary: 79 mg/dL (ref 70–99)
Glucose-Capillary: 85 mg/dL (ref 70–99)

## 2019-07-23 LAB — HEMOGLOBIN AND HEMATOCRIT, BLOOD
HCT: 28.5 % — ABNORMAL LOW (ref 36.0–46.0)
HCT: 28.9 % — ABNORMAL LOW (ref 36.0–46.0)
Hemoglobin: 8.7 g/dL — ABNORMAL LOW (ref 12.0–15.0)
Hemoglobin: 8.7 g/dL — ABNORMAL LOW (ref 12.0–15.0)

## 2019-07-23 MED ORDER — DOCUSATE SODIUM 100 MG PO CAPS: 100.0000 mg | ORAL_CAPSULE | Freq: Two times a day (BID) | ORAL | 0 refills | Status: AC

## 2019-07-23 MED ORDER — FUROSEMIDE 40 MG PO TABS: 40.0000 mg | ORAL_TABLET | Freq: Every day | ORAL | Status: AC

## 2019-07-23 MED ORDER — POLYETHYLENE GLYCOL 3350 17 G PO PACK: 17.0000 g | PACK | Freq: Every day | ORAL | 0 refills | Status: AC

## 2019-07-23 MED ORDER — INSULIN GLARGINE 100 UNIT/ML ~~LOC~~ SOLN: 10.0000 [IU] | Freq: Every day | SUBCUTANEOUS | 11 refills | Status: AC

## 2019-07-23 MED ORDER — PANTOPRAZOLE SODIUM 40 MG PO TBEC: 40.0000 mg | DELAYED_RELEASE_TABLET | Freq: Every day | ORAL | Status: AC

## 2019-07-23 NOTE — Plan of Care (Addendum)
Pt had two Lg, dark green BM's back to back. Type 7

## 2019-07-23 NOTE — Consult Note (Signed)
   [facility] CM Inpatient Consult   07/23/2019  Grace Zamora 1935/10/12 [id number]   Patient chart has been reviewed for readmissions less than 30 days and for high risk score, 32%, for unplanned readmissions.  Patient assessed for community [place] Management follow up needs.    Chart review reveals disposition plan is for ALF with hospice. No [facility] CM needs.  [name], MSN, [facility] Liaison Nurse Mobile Phone [phone]  Toll free office [phone]

## 2019-07-23 NOTE — Progress Notes (Signed)
Daily Progress Note   Patient Name: Grace ReichertMary Caffey       Date: 07/23/2019 DOB: 1935/03/11  Age: 84 y.o. MRN#: [id number] Attending Physician: Kathlen ModyAkula, Vijaya, MD Primary Care Physician: Eloisa NorthernAmin, Saad, MD Admit Date: 07/19/2019  Reason for Consultation/Follow-up: Establishing goals of care  Subjective: Continues with dk liquid stools, hgb stable. Asking to go home. Remains confused - some agitation when being cleaned up.  Length of Stay: 4  Current Medications: Scheduled Meds:  . Chlorhexidine Gluconate Cloth  6 each Topical Daily  . cholecalciferol  2,000 Units Oral Daily  . diltiazem  120 mg Oral Daily  . docusate sodium  100 mg Oral BID  . ferrous sulfate  325 mg Oral BID WC  . fluticasone furoate-vilanterol  1 puff Inhalation Daily  . insulin aspart  0-5 Units Subcutaneous QHS  . insulin aspart  0-9 Units Subcutaneous TID WC  . insulin glargine  10 Units Subcutaneous QHS  . mouth rinse  15 mL Mouth Rinse BID  . memantine  14 mg Oral QPM  . metoprolol tartrate  25 mg Oral BID  . nystatin   Topical BID  . pantoprazole  40 mg Oral Daily  . polyethylene glycol  17 g Oral Daily  . senna-docusate  2 tablet Oral BID  . traZODone  50 mg Oral QHS    Continuous Infusions: . sodium chloride      PRN Meds: acetaminophen, camphor-menthol, ipratropium-albuterol, liver oil-zinc oxide, ondansetron **OR** ondansetron (ZOFRAN) IV     Vital Signs: BP 134/77 (BP Location: Left Arm)   Pulse 77   Temp 98.5 F (36.9 C) (Oral)   Resp 19   Ht 5\' 4"  (1.626 m)   Wt 79.1 kg   SpO2 99%   BMI 29.93 kg/m  SpO2: SpO2: 99 % O2 Device: O2 Device: Room Air O2 Flow Rate:    Intake/output summary:   Intake/Output Summary (Last 24 hours) at 07/23/2019 1257 Last data filed at 07/23/2019 1200 Gross per 24 hour   Intake 900.34 ml  Output 950 ml  Net -49.66 ml   LBM: Last BM Date: 07/23/19 Baseline Weight: Weight: 75.6 kg Most recent weight: Weight: 79.1 kg       Palliative Assessment/Data: PPS 20%    Flowsheet Rows     Most Recent Value  Intake Tab  Referral Department  Hospitalist  Unit at Time of Referral  Intermediate Care Unit  Palliative Care Primary Diagnosis  Nephrology  Date Notified  07/20/19  Palliative Care Type  New Palliative care  Reason for referral  Clarify Goals of Care  Date of Admission  07/19/19  Date first seen by Palliative Care  07/20/19  # of days Palliative referral response time  0 Day(s)  # of days IP prior to Palliative referral  1  Clinical Assessment  Palliative Performance Scale Score  20%  Psychosocial & Spiritual Assessment  Palliative Care Outcomes  Patient/Family meeting held?  Yes  Who was at the meeting?  daughter  Palliative Care Outcomes  Clarified goals of care, Counseled regarding hospice, Provided psychosocial or spiritual support      Patient Active Problem List   Diagnosis Date Noted  . AKI (acute kidney injury) (  HCC)   . Gastrointestinal hemorrhage   . Goals of care, counseling/discussion   . Palliative care by specialist   . Acute renal failure (ARF) ([place]) 07/19/2019  . Closed bicondylar fracture of right tibial plateau 06/24/2019  . Fall at home, initial encounter 06/21/2019  . Atrial fibrillation, chronic 06/21/2019  . Essential hypertension 06/21/2019  . Alzheimer's dementia ([place]) 06/21/2019  . Type 2 diabetes mellitus ([place]) 06/21/2019    Palliative Care Assessment & Plan   HPI: 84 y.o. female  with past medical history of dementia, a fib, HTN, T2DM, CHF, and CKD admitted on 07/19/2019 with rectal bleeding.  Found to have INR >10 - this has been corrected and no further bleeding. She was also found to have acute renal failure with K >7.5 and creatinine of 4.38 (was 1.41 in early July). She had a recent hospitalization for a  fall and leg fracture - was d/c'd to ALF 06/26/19. This admission, renal US was performed and revealed bilateral hydronephrosis and distended bladder. No obstruction of ureters was found. Foley placed and patient now making significant amount of urine. PMT consulted for [place] as patient has multiple comorbidities.   Assessment: Spoke with Horris Latino again this AM - update given and all questions addressed. She asked about continued BMs, we discussed that her hgb remains stable. Discussed appetite and improved kidney function. Again discussed discontinuation of anticoagulation and Horris Latino agrees with this.  Discussed that patient is ready for discharge - asking to go home. Horris Latino agrees with this and acknowledges that hospice will be setup once patient returns to [facility]. She does bring up therapy her mom was receiving prior to hospital admission - we discussed that insurance would not cover continued therapy and hospice. She expresses understanding.    Discussed plan with Dr. Karleen Hampshire  Recommendations/Plan:  Probable discharge today - hospice care at [facility] - family agrees to plan  Family agrees to d/c anticoagulation  Code Status:  DNR  Prognosis:   < 6 months - dementia FAST 7c  Discharge Planning:  ALF with hospice  Care plan was discussed with patient's daughter, Horris Latino  Thank you for allowing the Palliative Medicine Team to assist in the care of this patient.   Total Time 25 minutes Prolonged Time Billed  no    The above conversation was completed via telephone due to the visitor restrictions during the COVID-19 pandemic. Thorough chart review and discussion with necessary members of the care team was completed as part of assessment. All issues were discussed and addressed but no physical exam was performed.    Greater than 50%  of this time was spent counseling and coordinating care related to the above assessment and plan.  Juel Burrow, DNP, [facility] Palliative  Medicine Team Team Phone # [phone]  Pager [phone]

## 2019-07-23 NOTE — TOC Transition Note (Signed)
Transition of Care [facility]) - CM/SW Discharge Note   Patient Details  Name: Grace Zamora MRN: [id number] Date of Birth: [birth year]  Transition of Care [facility]) CM/SW Contact:  Wende Neighbors, LCSW Phone Number: 07/23/2019, 3:06 PM   Clinical Narrative:   Patient to discharge back to [facility] via [place]. RN to call [place] at [phone] for report     Final next level of care: Memory Care Barriers to Discharge: No Barriers Identified   Patient Goals and CMS Choice Patient states their goals for this hospitalization and ongoing recovery are:: pt with Alzheimer's not oriented or able to meaningfully participate      Discharge Placement              Patient chooses bed at: [facility] Patient to be transferred to facility by: ptar Name of family member notified: daughter Patient and family notified of of transfer: 07/23/19  Discharge Plan and Services In-house Referral: Clinical Social Work                                   Social Determinants of Health ([place]) Interventions     Readmission Risk Interventions No flowsheet data found.

## 2019-07-23 NOTE — Plan of Care (Signed)
  Problem: Health Behavior/Discharge Planning: Goal: Ability to manage health-related needs will improve 07/23/2019 1440 by Timoteo Gaul, RN Outcome: Completed/Met 07/23/2019 1022 by Timoteo Gaul, RN Outcome: Progressing   Problem: Clinical Measurements: Goal: Ability to maintain clinical measurements within normal limits will improve 07/23/2019 1440 by Timoteo Gaul, RN Outcome: Completed/Met 07/23/2019 1022 by Timoteo Gaul, RN Outcome: Progressing Goal: Will remain free from infection 07/23/2019 1440 by Timoteo Gaul, RN Outcome: Completed/Met 07/23/2019 1022 by Timoteo Gaul, RN Outcome: Progressing Goal: Diagnostic test results will improve 07/23/2019 1440 by Timoteo Gaul, RN Outcome: Completed/Met 07/23/2019 1022 by Timoteo Gaul, RN Outcome: Progressing Goal: Respiratory complications will improve 07/23/2019 1440 by Timoteo Gaul, RN Outcome: Completed/Met 07/23/2019 1022 by Timoteo Gaul, RN Outcome: Progressing Goal: Cardiovascular complication will be avoided 07/23/2019 1440 by Timoteo Gaul, RN Outcome: Completed/Met 07/23/2019 1022 by Timoteo Gaul, RN Outcome: Progressing   Problem: Activity: Goal: Risk for activity intolerance will decrease 07/23/2019 1440 by Timoteo Gaul, RN Outcome: Completed/Met 07/23/2019 1022 by Timoteo Gaul, RN Outcome: Progressing   Problem: Nutrition: Goal: Adequate nutrition will be maintained 07/23/2019 1440 by Timoteo Gaul, RN Outcome: Completed/Met 07/23/2019 1022 by Timoteo Gaul, RN Outcome: Progressing   Problem: Coping: Goal: Level of anxiety will decrease 07/23/2019 1440 by Timoteo Gaul, RN Outcome: Completed/Met 07/23/2019 1022 by Timoteo Gaul, RN Outcome: Progressing   Problem: Elimination: Goal: Will not experience complications related to bowel motility 07/23/2019 1440 by Timoteo Gaul, RN Outcome: Completed/Met 07/23/2019 1022 by Timoteo Gaul, RN Outcome: Progressing Goal:  Will not experience complications related to urinary retention 07/23/2019 1440 by Timoteo Gaul, RN Outcome: Completed/Met 07/23/2019 1022 by Timoteo Gaul, RN Outcome: Progressing   Problem: Pain Managment: Goal: General experience of comfort will improve 07/23/2019 1440 by Timoteo Gaul, RN Outcome: Completed/Met 07/23/2019 1022 by Timoteo Gaul, RN Outcome: Progressing   Problem: Safety: Goal: Ability to remain free from injury will improve 07/23/2019 1440 by Timoteo Gaul, RN Outcome: Completed/Met 07/23/2019 1022 by Timoteo Gaul, RN Outcome: Progressing   Problem: Skin Integrity: Goal: Risk for impaired skin integrity will decrease 07/23/2019 1440 by Timoteo Gaul, RN Outcome: Completed/Met 07/23/2019 1022 by Timoteo Gaul, RN Outcome: Progressing   Problem: Acute Rehab PT Goals(only PT should resolve) Goal: Pt Will Go Supine/Side To Sit Outcome: Completed/Met Goal: Pt Will Go Sit To Supine/Side Outcome: Completed/Met Goal: Pt Will Transfer Bed To Chair/Chair To Bed Outcome: Completed/Met

## 2019-07-23 NOTE — Plan of Care (Signed)

## 2019-07-23 NOTE — Discharge Summary (Signed)
Physician Discharge Summary  Grace ReichertMary Verbeke ZOX:[id number] DOB: 10-Mar-1935 DOA: 07/19/2019  PCP: Eloisa NorthernAmin, Saad, MD  Admit date: 07/19/2019 Discharge date: 07/23/2019  Admitted From: ALF Disposition:  Hospice at ALF.   Recommendations for Outpatient Follow-up:  1. Follow up with PCP in 1-2 weeks 2. Please obtain BMP/CBC in one week Please follow up with hospice services on discharge.    Discharge Condition:hospice.  CODE STATUS:DNR.  Diet recommendation: SOFT DIET.   Brief/Interim Summary: The patient is a 84 yr old woman who carries a past medical history for dementia, DM II, atrial fibrillation, and CHF. She was recently discharged from [facility] on 06/26/2019 after an admission for a closed bicondylar fracture of the right tibial Plateau. She was [facility] to [facility]. She was admitted for evaluation of bright red blood per rectum and AKI.   Discharge Diagnoses:  Active Problems:   Acute renal failure (ARF) (HCC)   Goals of care, counseling/discussion   Palliative care by specialist   AKI (acute kidney injury) (HCC)   Gastrointestinal hemorrhage   Bright red blood per rectum Appears to have resolved. Probably secondary to coagulopathy and  Sigmoid diverticulosis.  Patient's hemoglobin is around 10 at baseline, dropped to 8.  Continue to hold coumadin. ot had multiple melanotic bloody bowel movements and GI consulted, suggested not a candidate for invasive endoscopic procedures. Patients' hemoglobin is around 8 and discussed with daughter and we will continue to hold coumadin on discharge.     Acute kidney injury with moderate bilateral hydronephrosis without any evidence of obstructing stone or lesion.   probably secondary to prerenal azotemia and obstruction secondary to neurogenic bladder. In view of her dementia she is not a candidate for dialysis. Foley catheter was placed secondary to retention. Recommend foley for another week and outpatient  follow up with urology for voiding trial in the clinic.      Hyperkalemia Resolved.   Moderate bilateral hydronephrosis CT of the abdomen/stone protocol showed There is mild to moderate hydronephrosis bilaterally and bilateral perinephric fat stranding, both of which appear slightly increased when compared to CT dated 05/11/2016. There is no obstructing calculus or other lesion identified by noncontrast CT. Urology consulted Plan to continue the Foley catheter for now and follow up with Urology for voiding trial in the clinic.   Hypotension Resolved.     Chronic diastolic heart heart failure She appears to be compensated at this time.  Restart lasix  40 MG daily instead of BID.     Dementia She appears to be at baseline. She is able to tell her name, but is not oriented to place or time.  Insulin-dependent diabetes mellitus RESUME lantus.     Chronic atrial fibrillation Rate controlled with diltiazem.  Holding Coumadin for now due to persistent rectal bleeding. Discussed with family, in view of her bleeding and fall risk , and her transition to hospice, holding coumadin on discharge.   ? Constipation, large stool ball in the rectum  Stool softeners, dulcolax suppository. Manual disimpaction.    COPD No wheezing heard currently on room air.   Right bicondylar tibial plateau fracture status post ORIF on July 1 Weightbearing as tolerated.   In view of multiple comorbidities and acute renal failure palliative care consulted for goals of care. Daughter at this time plan to transition to ALF with hospice services.    Discharge Instructions  Discharge Instructions    Diet - low sodium heart healthy   Complete by: As directed  Discharge instructions   Complete by: As directed    Follow up with Hospice services on discharge .     Allergies as of 07/23/2019      Reactions   Lisinopril    Niacin And Related    Sulfa Antibiotics        Medication List    STOP taking these medications   HYDROcodone-acetaminophen 5-325 MG tablet Commonly known as: NORCO/VICODIN   Lantus SoloStar 100 UNIT/ML Solostar Pen Generic drug: Insulin Glargine Replaced by: insulin glargine 100 UNIT/ML injection   omeprazole 20 MG capsule Commonly known as: PRILOSEC Replaced by: pantoprazole 40 MG tablet   warfarin 4 MG tablet Commonly known as: Coumadin     TAKE these medications   acetaminophen 500 MG tablet Commonly known as: TYLENOL Take 500 mg by mouth 3 (three) times daily.   BAZA EX Apply 1 application topically 3 (three) times daily. Apply after brief chance   Breo Ellipta 100-25 MCG/INH Aepb Generic drug: fluticasone furoate-vilanterol Inhale 1 puff into the lungs daily. Rinse mouth after use   Calmoseptine 0.44-20.6 % Oint Generic drug: Menthol-Zinc Oxide Apply 1 application topically every 8 (eight) hours as needed (itching).   cholecalciferol 25 MCG (1000 UT) tablet Commonly known as: VITAMIN D Take 2,000 Units by mouth daily.   diltiazem 120 MG 24 hr capsule Commonly known as: CARDIZEM CD Take 120 mg by mouth daily.   docusate sodium 100 MG capsule Commonly known as: COLACE Take 1 capsule (100 mg total) by mouth 2 (two) times daily.   donepezil 10 MG tablet Commonly known as: ARICEPT Take 10 mg by mouth at bedtime.   ferrous sulfate 325 (65 FE) MG tablet Take 325 mg by mouth 2 (two) times daily with a meal.   furosemide 40 MG tablet Commonly known as: LASIX Take 1 tablet (40 mg total) by mouth daily. What changed: when to take this   insulin glargine 100 UNIT/ML injection Commonly known as: LANTUS Inject 0.1 mLs (10 Units total) into the skin at bedtime. Replaces: Lantus SoloStar 100 UNIT/ML Solostar Pen   ipratropium-albuterol 0.5-2.5 (3) MG/3ML Soln Commonly known as: DUONEB Take 3 mLs by nebulization every 2 (two) hours as needed (Shortness of breath).   magnesium citrate Soln Take 1 Bottle  by mouth daily as needed for moderate constipation or severe constipation.   metoprolol tartrate 25 MG tablet Commonly known as: LOPRESSOR Take 25 mg by mouth 2 (two) times a day.   mirtazapine 15 MG tablet Commonly known as: REMERON Take 7.5 mg by mouth at bedtime.   Namenda XR 14 MG Cp24 24 hr capsule Generic drug: memantine Take 14 mg by mouth every evening.   nystatin powder Generic drug: nystatin Apply 1 ampule topically daily.   pantoprazole 40 MG tablet Commonly known as: PROTONIX Take 1 tablet (40 mg total) by mouth daily. Start taking on: July 24, 2019 Replaces: omeprazole 20 MG capsule   polyethylene glycol 17 g packet Commonly known as: MIRALAX / GLYCOLAX Take 17 g by mouth daily. Start taking on: July 24, 2019   potassium chloride SA 20 MEQ tablet Commonly known as: K-DUR Take 20 mEq by mouth 2 (two) times daily.   Psyllium 400 MG Caps Take 400 mg by mouth at bedtime.   senna-docusate 8.6-50 MG tablet Commonly known as: Senokot-S Take 1 tablet by mouth 3 (three) times a week. MWF   sertraline 25 MG tablet Commonly known as: ZOLOFT Take 25 mg by mouth daily.   traZODone  50 MG tablet Commonly known as: DESYREL Take 50 mg by mouth at bedtime.   trolamine salicylate 10 % cream Commonly known as: ASPERCREME Apply 1 application topically 2 (two) times a day.      Follow-up Information    [name], MD. Schedule an appointment as soon as possible for a visit in 1 week(s).   Specialty: Internal Medicine Contact information: [address] [place] [address] [phone]          Allergies  Allergen Reactions  . Lisinopril   . Niacin And Related   . Sulfa Antibiotics     Consultations:  Palliative.   Renal .    Procedures/Studies: US Renal  Result Date: 07/19/2019 CLINICAL DATA:  Acute on chronic renal failure EXAM: RENAL / URINARY TRACT ULTRASOUND COMPLETE COMPARISON:  None. FINDINGS: Right Kidney: Renal measurements: 11.4  x 5.0 x 5.8 cm = volume: 173 mL. Poorly visualized. Echogenicity within normal limits. Moderate hydronephrosis. Left Kidney: Renal measurements: 11.2 x 6.3 x 6.5 cm = volume: 239 mL. Poorly visualized. Echogenicity within normal limits. Moderate hydronephrosis. Bladder: Within normal limits.  Bilateral bladder jets are visualized. IMPRESSION: Moderate hydronephrosis bilaterally. Correlate for neurogenic bladder/bladder outlet obstruction. Electronically Signed   By: Charline Bills M.D.   On: 07/19/2019 19:25   Dg Tibia/fibula Right Port  Result Date: 06/23/2019 CLINICAL DATA:  Proximal tibial fracture EXAM: PORTABLE RIGHT TIBIA AND FIBULA - 2 VIEW COMPARISON:  Intraoperative films from earlier in the same day. FINDINGS: Lateral fixation sideplate is noted along the proximal tibia with multiple fixation screws. Fracture fragments are in near anatomic alignment. Proximal fibular fracture is again noted. IMPRESSION: Status post ORIF of proximal tibial fracture. Electronically Signed   By: Alcide Clever M.D.   On: 06/23/2019 15:42   Ct Renal Stone Study  Result Date: 07/20/2019 CLINICAL DATA:  Confusion, agitation, dementia, acute renal failure, rectal bleeding EXAM: CT ABDOMEN AND PELVIS WITHOUT CONTRAST TECHNIQUE: Multidetector CT imaging of the abdomen and pelvis was performed following the standard protocol without IV contrast. COMPARISON:  Renal ultrasound, 07/19/2019 FINDINGS: Lower chest: Bibasilar scarring or atelectasis. Coronary artery calcifications. Hepatobiliary: No solid liver abnormality is seen. No gallstones, gallbladder wall thickening, or biliary dilatation. Pancreas: Unremarkable. No pancreatic ductal dilatation or surrounding inflammatory changes. Spleen: Normal in size without significant abnormality. Adrenals/Urinary Tract: Adrenal glands are unremarkable. There is mild to moderate hydronephrosis bilaterally and bilateral perinephric fat stranding, both of which appear slightly increased  when compared to CT dated 05/11/2016. There is no obstructing calculus or other lesion identified by noncontrast CT. The urinary bladder is decompressed by a Foley catheter. Stomach/Bowel: Stomach is within normal limits. Sigmoid diverticulosis. Large stool ball in the rectum. Vascular/Lymphatic: Severe aortic atherosclerosis. No enlarged abdominal or pelvic lymph nodes. Reproductive: Status post cholecystectomy. Other: No abdominal wall hernia or abnormality. No abdominopelvic ascites. Musculoskeletal: Left hip total arthroplasty. IMPRESSION: 1. There is mild to moderate hydronephrosis bilaterally and bilateral perinephric fat stranding, both of which appear slightly increased when compared to CT dated 05/11/2016. There is no obstructing calculus or other lesion identified by noncontrast CT. The urinary bladder is decompressed by a Foley catheter. 2. Sigmoid diverticulosis. Large stool ball in the rectum. Correlate for stercoral ulceration given reported history of rectal bleeding. 3. Other chronic, incidental, and postoperative findings as detailed above. Electronically Signed   By: Lauralyn Primes M.D.   On: 07/20/2019 14:24       Subjective: No new complaints. Wants to go home.  Discharge Exam: Vitals:   07/23/19 0609 07/23/19 1154  BP: (!) 160/58 134/77  Pulse: 76 77  Resp:  19  Temp: 99.8 F (37.7 C) 98.5 F (36.9 C)  SpO2: 97% 99%   Vitals:   07/22/19 1338 07/22/19 2040 07/23/19 0609 07/23/19 1154  BP: 129/75 (!) 153/74 (!) 160/58 134/77  Pulse: 67 (!) 124 76 77  Resp:  16  19  Temp: 99.9 F (37.7 C) (!) 97.5 F (36.4 C) 99.8 F (37.7 C) 98.5 F (36.9 C)  TempSrc: Oral Oral Oral Oral  SpO2: 100% 97% 97% 99%  Weight:      Height:        General: Pt is alert, awake, not in acute distress Cardiovascular: RRR, S1/S2 +, no rubs, no gallops Respiratory: CTA bilaterally, no wheezing, no rhonchi Abdominal: Soft, NT, ND, bowel sounds + Extremities: no edema, no cyanosis    The  results of significant diagnostics from this hospitalization (including imaging, microbiology, ancillary and laboratory) are listed below for reference.     Microbiology: Recent Results (from the past 240 hour(s))  Urine culture     Status: Abnormal   Collection Time: 07/19/19  3:15 PM   Specimen: Urine, Random  Result Value Ref Range Status   Specimen Description   Final    URINE, RANDOM Performed at [facility], [address] [place], [place] [address]    Special Requests   Final    NONE Performed at [facility], [address], [place], [place] [address]    Culture 10,000 COLONIES/mL PROTEUS MIRABILIS (A)  Final   Report Status 07/22/2019 FINAL  Final   Organism ID, Bacteria PROTEUS MIRABILIS (A)  Final      Susceptibility   Proteus mirabilis - MIC*    AMPICILLIN <=2 SENSITIVE Sensitive     CEFAZOLIN 8 SENSITIVE Sensitive     CEFTRIAXONE <=1 SENSITIVE Sensitive     CIPROFLOXACIN >=4 RESISTANT Resistant     GENTAMICIN 8 INTERMEDIATE Intermediate     IMIPENEM 2 SENSITIVE Sensitive     NITROFURANTOIN 128 RESISTANT Resistant     TRIMETH/SULFA >=320 RESISTANT Resistant     AMPICILLIN/SULBACTAM <=2 SENSITIVE Sensitive     PIP/TAZO <=4 SENSITIVE Sensitive     * 10,000 COLONIES/mL PROTEUS MIRABILIS  SARS Coronavirus 2 (CEPHEID - Performed in [facility] hospital lab), Hosp Order     Status: None   Collection Time: 07/19/19  3:45 PM   Specimen: Nasopharyngeal Swab  Result Value Ref Range Status   SARS Coronavirus 2 NEGATIVE NEGATIVE Final    Comment: (NOTE) If result is NEGATIVE SARS-CoV-2 target nucleic acids are NOT DETECTED. The SARS-CoV-2 RNA is generally detectable in upper and lower  respiratory specimens during the acute phase of infection. The lowest  concentration of SARS-CoV-2 viral copies this assay can detect is 250  copies / mL. A negative result does not preclude SARS-CoV-2 infection  and should not be used as the sole  basis for treatment or other  patient management decisions.  A negative result may occur with  improper specimen collection / handling, submission of specimen other  than nasopharyngeal swab, presence of viral mutation(s) within the  areas targeted by this assay, and inadequate number of viral copies  (<250 copies / mL). A negative result must be combined with clinical  observations, patient history, and epidemiological information. If result is POSITIVE SARS-CoV-2 target nucleic acids are DETECTED. The SARS-CoV-2 RNA is generally detectable in upper and lower  respiratory specimens dur ing the acute phase of infection.  Positive  results are indicative of active infection with SARS-CoV-2.  Clinical  correlation with patient history and other diagnostic information is  necessary to determine patient infection status.  Positive results do  not rule out bacterial infection or co-infection with other viruses. If result is PRESUMPTIVE POSTIVE SARS-CoV-2 nucleic acids MAY BE PRESENT.   A presumptive positive result was obtained on the submitted specimen  and confirmed on repeat testing.  While 2019 novel coronavirus  (SARS-CoV-2) nucleic acids may be present in the submitted sample  additional confirmatory testing may be necessary for epidemiological  and / or clinical management purposes  to differentiate between  SARS-CoV-2 and other Sarbecovirus currently known to infect humans.  If clinically indicated additional testing with an alternate test  methodology [phone]) is advised. The SARS-CoV-2 RNA is generally  detectable in upper and lower respiratory sp ecimens during the acute  phase of infection. The expected result is Negative. Fact Sheet for Patients:  BoilerBrush.com.cy Fact Sheet for Healthcare Providers: https://pope.com/ This test is not yet approved or cleared by the [place] FDA and has been authorized for detection  and/or diagnosis of SARS-CoV-2 by FDA under an Emergency Use Authorization (EUA).  This EUA will remain in effect (meaning this test can be used) for the duration of the COVID-19 declaration under Section 564(b)(1) of the Act, 21 U.S.C. section 360bbb-3(b)(1), unless the authorization is terminated or revoked sooner. Performed at [facility], [address] [place], [place] [address]   Culture, blood (routine x 2)     Status: None (Preliminary result)   Collection Time: 07/19/19  5:23 PM   Specimen: BLOOD  Result Value Ref Range Status   Specimen Description   Final    BLOOD RIGHT ANTECUBITAL Performed at [facility], [address], [place], [place] [address]    Special Requests   Final    BOTTLES DRAWN AEROBIC AND ANAEROBIC Blood Culture adequate volume Performed at [facility], [address], [place], [place] [address]    Culture   Final    NO GROWTH 3 DAYS Performed at [facility], [address], [place], [place] [address]    Report Status PENDING  Incomplete  Culture, blood (routine x 2)     Status: Abnormal   Collection Time: 07/19/19  6:59 PM   Specimen: BLOOD RIGHT HAND  Result Value Ref Range Status   Specimen Description   Final    BLOOD RIGHT HAND Performed at [facility], [address] [place], [place] [address]    Special Requests   Final    BOTTLES DRAWN AEROBIC ONLY Blood Culture adequate volume Performed at [facility], [address] [place], [place] [address]    Culture  Setup Time   Final    AEROBIC BOTTLE ONLY GRAM POSITIVE COCCI CRITICAL RESULT CALLED TO, READ BACK BY AND VERIFIED WITH: J GRIMSLEY [facility] 07/21/19 0206 JDW    Culture (A)  Final    STAPHYLOCOCCUS SPECIES (COAGULASE NEGATIVE) THE SIGNIFICANCE OF ISOLATING THIS ORGANISM FROM A SINGLE SET OF BLOOD CULTURES WHEN MULTIPLE SETS ARE DRAWN IS UNCERTAIN. PLEASE NOTIFY THE MICROBIOLOGY  DEPARTMENT WITHIN ONE WEEK IF SPECIATION AND SENSITIVITIES ARE REQUIRED. Performed at [facility], [address] [place], [place] [address]    Report Status 07/22/2019 FINAL  Final  Blood Culture ID Panel (Reflexed)     Status: Abnormal  Collection Time: 07/19/19  6:59 PM  Result Value Ref Range Status   Enterococcus species NOT DETECTED NOT DETECTED Final   Listeria monocytogenes NOT DETECTED NOT DETECTED Final   Staphylococcus species DETECTED (A) NOT DETECTED Final    Comment: Methicillin (oxacillin) susceptible coagulase negative staphylococcus. Possible blood culture contaminant (unless isolated from more than one blood culture draw or clinical case suggests pathogenicity). No antibiotic treatment is indicated for blood  culture contaminants. CRITICAL RESULT CALLED TO, READ BACK BY AND VERIFIED WITH: Peggyann Juba PHARMD 07/21/19 0206 JDW    Staphylococcus aureus (BCID) NOT DETECTED NOT DETECTED Final   Methicillin resistance NOT DETECTED NOT DETECTED Final   Streptococcus species NOT DETECTED NOT DETECTED Final   Streptococcus agalactiae NOT DETECTED NOT DETECTED Final   Streptococcus pneumoniae NOT DETECTED NOT DETECTED Final   Streptococcus pyogenes NOT DETECTED NOT DETECTED Final   Acinetobacter baumannii NOT DETECTED NOT DETECTED Final   Enterobacteriaceae species NOT DETECTED NOT DETECTED Final   Enterobacter cloacae complex NOT DETECTED NOT DETECTED Final   Escherichia coli NOT DETECTED NOT DETECTED Final   Klebsiella oxytoca NOT DETECTED NOT DETECTED Final   Klebsiella pneumoniae NOT DETECTED NOT DETECTED Final   Proteus species NOT DETECTED NOT DETECTED Final   Serratia marcescens NOT DETECTED NOT DETECTED Final   Haemophilus influenzae NOT DETECTED NOT DETECTED Final   Neisseria meningitidis NOT DETECTED NOT DETECTED Final   Pseudomonas aeruginosa NOT DETECTED NOT DETECTED Final   Candida albicans NOT DETECTED NOT DETECTED Final   Candida glabrata NOT DETECTED  NOT DETECTED Final   Candida krusei NOT DETECTED NOT DETECTED Final   Candida parapsilosis NOT DETECTED NOT DETECTED Final   Candida tropicalis NOT DETECTED NOT DETECTED Final    Comment: Performed at [facility], [address], [place], [place] [address]  MRSA PCR Screening     Status: None   Collection Time: 07/19/19  7:45 PM   Specimen: Nasal Mucosa; Nasopharyngeal  Result Value Ref Range Status   MRSA by PCR NEGATIVE NEGATIVE Final    Comment:        The GeneXpert MRSA Assay (FDA approved for NASAL specimens only), is one component of a comprehensive MRSA colonization surveillance program. It is not intended to diagnose MRSA infection nor to guide or monitor treatment for MRSA infections. Performed at [facility], [address], [place], [place] [address]      Labs: BNP (last 3 results) No results for input(s): BNP in the last 8760 hours. Basic Metabolic Panel: Recent Labs  Lab 07/19/19 1859  07/20/19 0154 07/20/19 1715 07/21/19 0233 07/22/19 0520 07/23/19 0508  NA  --    < > 131* 136 137 141 140  K 6.9*   < > 5.3* 5.1 4.1 3.2* 3.6  CL  --    < > 108 110 110 114* 111  CO2  --    < > 16* 19* 16* 19* 22  GLUCOSE 89   < > 129* 97 92 101* 83  BUN  --    < > 89* 71* 59* 31* 19  CREATININE  --    < > 4.03* 2.94* 2.28* 1.37* 1.13*  CALCIUM  --    < > 9.2 9.0 8.4* 8.3* 8.4*  MG 2.2  --   --   --   --   --   --   PHOS  --   --   --   --  3.6 2.6 2.4*   < > =  values in this interval not displayed.   Liver Function Tests: Recent Labs  Lab 07/19/19 1444 07/20/19 0154 07/21/19 0233 07/22/19 0520 07/23/19 0508  AST 18 17  --   --   --   ALT 10 11  --   --   --   ALKPHOS 117 97  --   --   --   BILITOT 0.7 0.7  --   --   --   PROT 7.8 6.6  --   --   --   ALBUMIN 3.7 3.1* 2.8* 2.8* 2.7*   No results for input(s): LIPASE, AMYLASE in the last 168 hours. No results for input(s): AMMONIA in the last 168 hours. CBC: Recent Labs  Lab  07/19/19 1444 07/20/19 0154 07/21/19 0233 07/22/19 0520 07/22/19 1609 07/23/19 0001 07/23/19 0508  WBC 8.9 11.5* 6.5 5.0  --   --  5.9  NEUTROABS  --  9.7* 4.6 3.4  --   --  4.1  HGB 10.7* 9.4* 8.4* 8.3* 8.4* 8.7* 8.2*  HCT 35.1* 31.0* 28.4* 26.9* 27.6* 28.5* 27.0*  MCV 97.2 98.4 97.9 96.8  --   --  99.3  PLT 275 217 192 184  --   --  173   Cardiac Enzymes: No results for input(s): CKTOTAL, CKMB, CKMBINDEX, TROPONINI in the last 168 hours. BNP: Invalid input(s): POCBNP CBG: Recent Labs  Lab 07/22/19 1154 07/22/19 1714 07/22/19 2237 07/23/19 0748 07/23/19 1150  GLUCAP 103* 97 94 79 85   D-Dimer No results for input(s): DDIMER in the last 72 hours. Hgb A1c No results for input(s): HGBA1C in the last 72 hours. Lipid Profile No results for input(s): CHOL, HDL, LDLCALC, TRIG, CHOLHDL, LDLDIRECT in the last 72 hours. Thyroid function studies No results for input(s): TSH, T4TOTAL, T3FREE, THYROIDAB in the last 72 hours.  Invalid input(s): FREET3 Anemia work up No results for input(s): VITAMINB12, FOLATE, FERRITIN, TIBC, IRON, RETICCTPCT in the last 72 hours. Urinalysis    Component Value Date/Time   COLORURINE YELLOW 07/19/2019 1515   APPEARANCEUR HAZY (A) 07/19/2019 1515   LABSPEC 1.013 07/19/2019 1515   PHURINE 5.0 07/19/2019 1515   GLUCOSEU NEGATIVE 07/19/2019 1515   HGBUR LARGE (A) 07/19/2019 1515   BILIRUBINUR NEGATIVE 07/19/2019 1515   KETONESUR NEGATIVE 07/19/2019 1515   PROTEINUR NEGATIVE 07/19/2019 1515   NITRITE NEGATIVE 07/19/2019 1515   LEUKOCYTESUR LARGE (A) 07/19/2019 1515   Sepsis Labs Invalid input(s): PROCALCITONIN,  WBC,  LACTICIDVEN Microbiology Recent Results (from the past 240 hour(s))  Urine culture     Status: Abnormal   Collection Time: 07/19/19  3:15 PM   Specimen: Urine, Random  Result Value Ref Range Status   Specimen Description   Final    URINE, RANDOM Performed at [facility], [address]  [place], [place] [address]    Special Requests   Final    NONE Performed at [facility], [address] [place], [place] [address]    Culture 10,000 COLONIES/mL PROTEUS MIRABILIS (A)  Final   Report Status 07/22/2019 FINAL  Final   Organism ID, Bacteria PROTEUS MIRABILIS (A)  Final      Susceptibility   Proteus mirabilis - MIC*    AMPICILLIN <=2 SENSITIVE Sensitive     CEFAZOLIN 8 SENSITIVE Sensitive     CEFTRIAXONE <=1 SENSITIVE Sensitive     CIPROFLOXACIN >=4 RESISTANT Resistant     GENTAMICIN 8 INTERMEDIATE Intermediate     IMIPENEM 2 SENSITIVE Sensitive  NITROFURANTOIN 128 RESISTANT Resistant     TRIMETH/SULFA >=320 RESISTANT Resistant     AMPICILLIN/SULBACTAM <=2 SENSITIVE Sensitive     PIP/TAZO <=4 SENSITIVE Sensitive     * 10,000 COLONIES/mL PROTEUS MIRABILIS  SARS Coronavirus 2 (CEPHEID - Performed in [place] hospital lab), Hosp Order     Status: None   Collection Time: 07/19/19  3:45 PM   Specimen: Nasopharyngeal Swab  Result Value Ref Range Status   SARS Coronavirus 2 NEGATIVE NEGATIVE Final    Comment: (NOTE) If result is NEGATIVE SARS-CoV-2 target nucleic acids are NOT DETECTED. The SARS-CoV-2 RNA is generally detectable in upper and lower  respiratory specimens during the acute phase of infection. The lowest  concentration of SARS-CoV-2 viral copies this assay can detect is 250  copies / mL. A negative result does not preclude SARS-CoV-2 infection  and should not be used as the sole basis for treatment or other  patient management decisions.  A negative result may occur with  improper specimen collection / handling, submission of specimen other  than nasopharyngeal swab, presence of viral mutation(s) within the  areas targeted by this assay, and inadequate number of viral copies  (<250 copies / mL). A negative result must be combined with clinical  observations, patient history, and epidemiological information. If result is  POSITIVE SARS-CoV-2 target nucleic acids are DETECTED. The SARS-CoV-2 RNA is generally detectable in upper and lower  respiratory specimens dur ing the acute phase of infection.  Positive  results are indicative of active infection with SARS-CoV-2.  Clinical  correlation with patient history and other diagnostic information is  necessary to determine patient infection status.  Positive results do  not rule out bacterial infection or co-infection with other viruses. If result is PRESUMPTIVE POSTIVE SARS-CoV-2 nucleic acids MAY BE PRESENT.   A presumptive positive result was obtained on the submitted specimen  and confirmed on repeat testing.  While 2019 novel coronavirus  (SARS-CoV-2) nucleic acids may be present in the submitted sample  additional confirmatory testing may be necessary for epidemiological  and / or clinical management purposes  to differentiate between  SARS-CoV-2 and other Sarbecovirus currently known to infect humans.  If clinically indicated additional testing with an alternate test  methodology [phone]) is advised. The SARS-CoV-2 RNA is generally  detectable in upper and lower respiratory sp ecimens during the acute  phase of infection. The expected result is Negative. Fact Sheet for Patients:  StrictlyIdeas.no Fact Sheet for Healthcare Providers: BankingDealers.co.za This test is not yet approved or cleared by the [place] FDA and has been authorized for detection and/or diagnosis of SARS-CoV-2 by FDA under an Emergency Use Authorization (EUA).  This EUA will remain in effect (meaning this test can be used) for the duration of the COVID-19 declaration under Section 564(b)(1) of the Act, 21 U.S.C. section 360bbb-3(b)(1), unless the authorization is terminated or revoked sooner. Performed at [facility], [place] [address] [place], [place] [address]   Culture, blood (routine x 2)     Status:  None (Preliminary result)   Collection Time: 07/19/19  5:23 PM   Specimen: BLOOD  Result Value Ref Range Status   Specimen Description   Final    BLOOD RIGHT ANTECUBITAL Performed at [facility] [address] [place], [place] [address]    Special Requests   Final    BOTTLES DRAWN AEROBIC AND ANAEROBIC Blood Culture adequate volume Performed at [facility] [address] [place], [place]  [address]    Culture   Final    NO GROWTH 3 DAYS Performed at [facility], [address] [place], [place] [address]    Report Status PENDING  Incomplete  Culture, blood (routine x 2)     Status: Abnormal   Collection Time: 07/19/19  6:59 PM   Specimen: BLOOD RIGHT HAND  Result Value Ref Range Status   Specimen Description   Final    BLOOD RIGHT HAND Performed at [facility], [address] [place], [place] [address]    Special Requests   Final    BOTTLES DRAWN AEROBIC ONLY Blood Culture adequate volume Performed at [facility], [address] [place], [place] [address]    Culture  Setup Time   Final    AEROBIC BOTTLE ONLY GRAM POSITIVE COCCI CRITICAL RESULT CALLED TO, READ BACK BY AND VERIFIED WITH: J GRIMSLEY [facility] 07/21/19 0206 JDW    Culture (A)  Final    STAPHYLOCOCCUS SPECIES (COAGULASE NEGATIVE) THE SIGNIFICANCE OF ISOLATING THIS ORGANISM FROM A SINGLE SET OF BLOOD CULTURES WHEN MULTIPLE SETS ARE DRAWN IS UNCERTAIN. PLEASE NOTIFY THE MICROBIOLOGY DEPARTMENT WITHIN ONE WEEK IF SPECIATION AND SENSITIVITIES ARE REQUIRED. Performed at [facility], [address], [place], [place] [address]    Report Status 07/22/2019 FINAL  Final  Blood Culture ID Panel (Reflexed)     Status: Abnormal   Collection Time: 07/19/19  6:59 PM  Result Value Ref Range Status   Enterococcus species NOT DETECTED NOT DETECTED Final   Listeria monocytogenes NOT DETECTED NOT DETECTED Final   Staphylococcus species  DETECTED (A) NOT DETECTED Final    Comment: Methicillin (oxacillin) susceptible coagulase negative staphylococcus. Possible blood culture contaminant (unless isolated from more than one blood culture draw or clinical case suggests pathogenicity). No antibiotic treatment is indicated for blood  culture contaminants. CRITICAL RESULT CALLED TO, READ BACK BY AND VERIFIED WITH: Peggyann Juba PHARMD 07/21/19 0206 JDW    Staphylococcus aureus (BCID) NOT DETECTED NOT DETECTED Final   Methicillin resistance NOT DETECTED NOT DETECTED Final   Streptococcus species NOT DETECTED NOT DETECTED Final   Streptococcus agalactiae NOT DETECTED NOT DETECTED Final   Streptococcus pneumoniae NOT DETECTED NOT DETECTED Final   Streptococcus pyogenes NOT DETECTED NOT DETECTED Final   Acinetobacter baumannii NOT DETECTED NOT DETECTED Final   Enterobacteriaceae species NOT DETECTED NOT DETECTED Final   Enterobacter cloacae complex NOT DETECTED NOT DETECTED Final   Escherichia coli NOT DETECTED NOT DETECTED Final   Klebsiella oxytoca NOT DETECTED NOT DETECTED Final   Klebsiella pneumoniae NOT DETECTED NOT DETECTED Final   Proteus species NOT DETECTED NOT DETECTED Final   Serratia marcescens NOT DETECTED NOT DETECTED Final   Haemophilus influenzae NOT DETECTED NOT DETECTED Final   Neisseria meningitidis NOT DETECTED NOT DETECTED Final   Pseudomonas aeruginosa NOT DETECTED NOT DETECTED Final   Candida albicans NOT DETECTED NOT DETECTED Final   Candida glabrata NOT DETECTED NOT DETECTED Final   Candida krusei NOT DETECTED NOT DETECTED Final   Candida parapsilosis NOT DETECTED NOT DETECTED Final   Candida tropicalis NOT DETECTED NOT DETECTED Final    Comment: Performed at [facility], [address] [place], [place] [address]  MRSA PCR Screening     Status: None   Collection Time: 07/19/19  7:45 PM   Specimen: Nasal Mucosa; Nasopharyngeal  Result Value Ref Range Status   MRSA by PCR NEGATIVE NEGATIVE Final     Comment:  The GeneXpert MRSA Assay (FDA approved for NASAL specimens only), is one component of a comprehensive MRSA colonization surveillance program. It is not intended to diagnose MRSA infection nor to guide or monitor treatment for MRSA infections. Performed at [facility], [address], [place], [place] [address]      Time coordinating discharge: 32 minutes  SIGNED:   Kathlen Mody, MD  [facility] 07/23/2019, 1:08 PM Pager   If 7PM-7AM, please contact night-coverage [email or web address] Password [email or web address]

## 2019-07-24 LAB — CULTURE, BLOOD (ROUTINE X 2)
Culture: NO GROWTH
Special Requests: ADEQUATE

## 2019-07-26 DIAGNOSIS — Z978 Presence of other specified devices: Secondary | ICD-10-CM | POA: Diagnosis not present

## 2019-07-26 DIAGNOSIS — K921 Melena: Secondary | ICD-10-CM | POA: Diagnosis not present

## 2019-07-26 DIAGNOSIS — Z7901 Long term (current) use of anticoagulants: Secondary | ICD-10-CM | POA: Diagnosis not present

## 2019-07-26 DIAGNOSIS — L89611 Pressure ulcer of right heel, stage 1: Secondary | ICD-10-CM | POA: Diagnosis not present

## 2019-07-26 DIAGNOSIS — E876 Hypokalemia: Secondary | ICD-10-CM | POA: Diagnosis not present

## 2019-07-26 DIAGNOSIS — D500 Iron deficiency anemia secondary to blood loss (chronic): Secondary | ICD-10-CM | POA: Diagnosis not present

## 2019-07-26 DIAGNOSIS — Z9889 Other specified postprocedural states: Secondary | ICD-10-CM | POA: Diagnosis not present

## 2019-07-30 DIAGNOSIS — E876 Hypokalemia: Secondary | ICD-10-CM | POA: Diagnosis not present

## 2019-07-30 DIAGNOSIS — K921 Melena: Secondary | ICD-10-CM | POA: Diagnosis not present

## 2019-08-02 DIAGNOSIS — R5381 Other malaise: Secondary | ICD-10-CM | POA: Diagnosis not present

## 2019-08-02 DIAGNOSIS — R630 Anorexia: Secondary | ICD-10-CM | POA: Diagnosis not present

## 2019-08-02 DIAGNOSIS — K921 Melena: Secondary | ICD-10-CM | POA: Diagnosis not present

## 2019-08-02 DIAGNOSIS — G301 Alzheimer's disease with late onset: Secondary | ICD-10-CM | POA: Diagnosis not present

## 2019-08-02 DIAGNOSIS — L89610 Pressure ulcer of right heel, unstageable: Secondary | ICD-10-CM | POA: Diagnosis not present

## 2019-08-03 DIAGNOSIS — F0151 Vascular dementia with behavioral disturbance: Secondary | ICD-10-CM | POA: Diagnosis not present

## 2019-08-03 DIAGNOSIS — M6281 Muscle weakness (generalized): Secondary | ICD-10-CM | POA: Diagnosis not present

## 2019-08-03 DIAGNOSIS — R296 Repeated falls: Secondary | ICD-10-CM | POA: Diagnosis not present

## 2019-08-05 DIAGNOSIS — N170 Acute kidney failure with tubular necrosis: Secondary | ICD-10-CM | POA: Diagnosis not present

## 2019-08-05 DIAGNOSIS — R338 Other retention of urine: Secondary | ICD-10-CM | POA: Diagnosis not present

## 2019-08-05 DIAGNOSIS — N130 Hydronephrosis with ureteropelvic junction obstruction: Secondary | ICD-10-CM | POA: Diagnosis not present

## 2019-08-06 DIAGNOSIS — F0151 Vascular dementia with behavioral disturbance: Secondary | ICD-10-CM | POA: Diagnosis not present

## 2019-08-06 DIAGNOSIS — R296 Repeated falls: Secondary | ICD-10-CM | POA: Diagnosis not present

## 2019-08-06 DIAGNOSIS — M6281 Muscle weakness (generalized): Secondary | ICD-10-CM | POA: Diagnosis not present

## 2019-08-09 DIAGNOSIS — F0151 Vascular dementia with behavioral disturbance: Secondary | ICD-10-CM | POA: Diagnosis not present

## 2019-08-09 DIAGNOSIS — R296 Repeated falls: Secondary | ICD-10-CM | POA: Diagnosis not present

## 2019-08-09 DIAGNOSIS — M6281 Muscle weakness (generalized): Secondary | ICD-10-CM | POA: Diagnosis not present

## 2019-08-10 DIAGNOSIS — F0151 Vascular dementia with behavioral disturbance: Secondary | ICD-10-CM | POA: Diagnosis not present

## 2019-08-10 DIAGNOSIS — R296 Repeated falls: Secondary | ICD-10-CM | POA: Diagnosis not present

## 2019-08-10 DIAGNOSIS — S82141D Displaced bicondylar fracture of right tibia, subsequent encounter for closed fracture with routine healing: Secondary | ICD-10-CM | POA: Diagnosis not present

## 2019-08-10 DIAGNOSIS — M6281 Muscle weakness (generalized): Secondary | ICD-10-CM | POA: Diagnosis not present

## 2019-08-11 DIAGNOSIS — R296 Repeated falls: Secondary | ICD-10-CM | POA: Diagnosis not present

## 2019-08-11 DIAGNOSIS — M6281 Muscle weakness (generalized): Secondary | ICD-10-CM | POA: Diagnosis not present

## 2019-08-11 DIAGNOSIS — F0151 Vascular dementia with behavioral disturbance: Secondary | ICD-10-CM | POA: Diagnosis not present

## 2019-08-12 DIAGNOSIS — R296 Repeated falls: Secondary | ICD-10-CM | POA: Diagnosis not present

## 2019-08-12 DIAGNOSIS — F0151 Vascular dementia with behavioral disturbance: Secondary | ICD-10-CM | POA: Diagnosis not present

## 2019-08-12 DIAGNOSIS — M6281 Muscle weakness (generalized): Secondary | ICD-10-CM | POA: Diagnosis not present

## 2019-08-16 DIAGNOSIS — L89302 Pressure ulcer of unspecified buttock, stage 2: Secondary | ICD-10-CM | POA: Diagnosis not present

## 2019-08-16 DIAGNOSIS — K921 Melena: Secondary | ICD-10-CM | POA: Diagnosis not present

## 2019-08-16 DIAGNOSIS — F0151 Vascular dementia with behavioral disturbance: Secondary | ICD-10-CM | POA: Diagnosis not present

## 2019-08-16 DIAGNOSIS — L89610 Pressure ulcer of right heel, unstageable: Secondary | ICD-10-CM | POA: Diagnosis not present

## 2019-08-16 DIAGNOSIS — R296 Repeated falls: Secondary | ICD-10-CM | POA: Diagnosis not present

## 2019-08-16 DIAGNOSIS — I509 Heart failure, unspecified: Secondary | ICD-10-CM | POA: Diagnosis not present

## 2019-08-16 DIAGNOSIS — R5381 Other malaise: Secondary | ICD-10-CM | POA: Diagnosis not present

## 2019-08-16 DIAGNOSIS — M6281 Muscle weakness (generalized): Secondary | ICD-10-CM | POA: Diagnosis not present

## 2019-08-17 DIAGNOSIS — R338 Other retention of urine: Secondary | ICD-10-CM | POA: Diagnosis not present

## 2019-08-17 DIAGNOSIS — N130 Hydronephrosis with ureteropelvic junction obstruction: Secondary | ICD-10-CM | POA: Diagnosis not present

## 2019-08-18 DIAGNOSIS — N183 Chronic kidney disease, stage 3 (moderate): Secondary | ICD-10-CM | POA: Diagnosis not present

## 2019-08-18 DIAGNOSIS — M6281 Muscle weakness (generalized): Secondary | ICD-10-CM | POA: Diagnosis not present

## 2019-08-18 DIAGNOSIS — F0151 Vascular dementia with behavioral disturbance: Secondary | ICD-10-CM | POA: Diagnosis not present

## 2019-08-18 DIAGNOSIS — R296 Repeated falls: Secondary | ICD-10-CM | POA: Diagnosis not present

## 2019-08-18 DIAGNOSIS — E0842 Diabetes mellitus due to underlying condition with diabetic polyneuropathy: Secondary | ICD-10-CM | POA: Diagnosis not present

## 2019-08-18 DIAGNOSIS — I739 Peripheral vascular disease, unspecified: Secondary | ICD-10-CM | POA: Diagnosis not present

## 2019-08-18 DIAGNOSIS — B351 Tinea unguium: Secondary | ICD-10-CM | POA: Diagnosis not present

## 2019-08-19 DIAGNOSIS — Z20828 Contact with and (suspected) exposure to other viral communicable diseases: Secondary | ICD-10-CM | POA: Diagnosis not present

## 2019-08-24 DIAGNOSIS — F0281 Dementia in other diseases classified elsewhere with behavioral disturbance: Secondary | ICD-10-CM | POA: Diagnosis not present

## 2019-08-24 DIAGNOSIS — I5032 Chronic diastolic (congestive) heart failure: Secondary | ICD-10-CM | POA: Diagnosis not present

## 2019-08-24 DIAGNOSIS — Z436 Encounter for attention to other artificial openings of urinary tract: Secondary | ICD-10-CM | POA: Diagnosis not present

## 2019-08-24 DIAGNOSIS — L89610 Pressure ulcer of right heel, unstageable: Secondary | ICD-10-CM | POA: Diagnosis not present

## 2019-08-24 DIAGNOSIS — E119 Type 2 diabetes mellitus without complications: Secondary | ICD-10-CM | POA: Diagnosis not present

## 2019-08-24 DIAGNOSIS — Z9181 History of falling: Secondary | ICD-10-CM | POA: Diagnosis not present

## 2019-08-24 DIAGNOSIS — R634 Abnormal weight loss: Secondary | ICD-10-CM | POA: Diagnosis not present

## 2019-08-24 DIAGNOSIS — I482 Chronic atrial fibrillation, unspecified: Secondary | ICD-10-CM | POA: Diagnosis not present

## 2019-08-24 DIAGNOSIS — G301 Alzheimer's disease with late onset: Secondary | ICD-10-CM | POA: Diagnosis not present

## 2019-08-24 DIAGNOSIS — K5750 Diverticulosis of both small and large intestine without perforation or abscess without bleeding: Secondary | ICD-10-CM | POA: Diagnosis not present

## 2019-08-24 DIAGNOSIS — I110 Hypertensive heart disease with heart failure: Secondary | ICD-10-CM | POA: Diagnosis not present

## 2019-08-24 DIAGNOSIS — Z87891 Personal history of nicotine dependence: Secondary | ICD-10-CM | POA: Diagnosis not present

## 2019-08-24 DIAGNOSIS — S82201D Unspecified fracture of shaft of right tibia, subsequent encounter for closed fracture with routine healing: Secondary | ICD-10-CM | POA: Diagnosis not present

## 2019-08-25 DIAGNOSIS — R634 Abnormal weight loss: Secondary | ICD-10-CM | POA: Diagnosis not present

## 2019-08-25 DIAGNOSIS — Z87891 Personal history of nicotine dependence: Secondary | ICD-10-CM | POA: Diagnosis not present

## 2019-08-25 DIAGNOSIS — Z9181 History of falling: Secondary | ICD-10-CM | POA: Diagnosis not present

## 2019-08-25 DIAGNOSIS — F0281 Dementia in other diseases classified elsewhere with behavioral disturbance: Secondary | ICD-10-CM | POA: Diagnosis not present

## 2019-08-25 DIAGNOSIS — G301 Alzheimer's disease with late onset: Secondary | ICD-10-CM | POA: Diagnosis not present

## 2019-08-25 DIAGNOSIS — Z436 Encounter for attention to other artificial openings of urinary tract: Secondary | ICD-10-CM | POA: Diagnosis not present

## 2019-08-26 DIAGNOSIS — Z87891 Personal history of nicotine dependence: Secondary | ICD-10-CM | POA: Diagnosis not present

## 2019-08-26 DIAGNOSIS — Z9181 History of falling: Secondary | ICD-10-CM | POA: Diagnosis not present

## 2019-08-26 DIAGNOSIS — G301 Alzheimer's disease with late onset: Secondary | ICD-10-CM | POA: Diagnosis not present

## 2019-08-26 DIAGNOSIS — R634 Abnormal weight loss: Secondary | ICD-10-CM | POA: Diagnosis not present

## 2019-08-26 DIAGNOSIS — Z436 Encounter for attention to other artificial openings of urinary tract: Secondary | ICD-10-CM | POA: Diagnosis not present

## 2019-08-26 DIAGNOSIS — F0281 Dementia in other diseases classified elsewhere with behavioral disturbance: Secondary | ICD-10-CM | POA: Diagnosis not present

## 2019-08-27 DIAGNOSIS — Z436 Encounter for attention to other artificial openings of urinary tract: Secondary | ICD-10-CM | POA: Diagnosis not present

## 2019-08-27 DIAGNOSIS — Z87891 Personal history of nicotine dependence: Secondary | ICD-10-CM | POA: Diagnosis not present

## 2019-08-27 DIAGNOSIS — F0281 Dementia in other diseases classified elsewhere with behavioral disturbance: Secondary | ICD-10-CM | POA: Diagnosis not present

## 2019-08-27 DIAGNOSIS — R634 Abnormal weight loss: Secondary | ICD-10-CM | POA: Diagnosis not present

## 2019-08-27 DIAGNOSIS — Z9181 History of falling: Secondary | ICD-10-CM | POA: Diagnosis not present

## 2019-08-27 DIAGNOSIS — G301 Alzheimer's disease with late onset: Secondary | ICD-10-CM | POA: Diagnosis not present

## 2019-09-01 DIAGNOSIS — Z9181 History of falling: Secondary | ICD-10-CM | POA: Diagnosis not present

## 2019-09-01 DIAGNOSIS — Z87891 Personal history of nicotine dependence: Secondary | ICD-10-CM | POA: Diagnosis not present

## 2019-09-01 DIAGNOSIS — F0281 Dementia in other diseases classified elsewhere with behavioral disturbance: Secondary | ICD-10-CM | POA: Diagnosis not present

## 2019-09-01 DIAGNOSIS — G301 Alzheimer's disease with late onset: Secondary | ICD-10-CM | POA: Diagnosis not present

## 2019-09-01 DIAGNOSIS — R634 Abnormal weight loss: Secondary | ICD-10-CM | POA: Diagnosis not present

## 2019-09-01 DIAGNOSIS — Z436 Encounter for attention to other artificial openings of urinary tract: Secondary | ICD-10-CM | POA: Diagnosis not present

## 2019-09-02 DIAGNOSIS — Z436 Encounter for attention to other artificial openings of urinary tract: Secondary | ICD-10-CM | POA: Diagnosis not present

## 2019-09-02 DIAGNOSIS — G301 Alzheimer's disease with late onset: Secondary | ICD-10-CM | POA: Diagnosis not present

## 2019-09-02 DIAGNOSIS — Z87891 Personal history of nicotine dependence: Secondary | ICD-10-CM | POA: Diagnosis not present

## 2019-09-02 DIAGNOSIS — R634 Abnormal weight loss: Secondary | ICD-10-CM | POA: Diagnosis not present

## 2019-09-02 DIAGNOSIS — Z9181 History of falling: Secondary | ICD-10-CM | POA: Diagnosis not present

## 2019-09-02 DIAGNOSIS — F0281 Dementia in other diseases classified elsewhere with behavioral disturbance: Secondary | ICD-10-CM | POA: Diagnosis not present

## 2019-09-09 DIAGNOSIS — R634 Abnormal weight loss: Secondary | ICD-10-CM | POA: Diagnosis not present

## 2019-09-09 DIAGNOSIS — F0281 Dementia in other diseases classified elsewhere with behavioral disturbance: Secondary | ICD-10-CM | POA: Diagnosis not present

## 2019-09-09 DIAGNOSIS — Z9181 History of falling: Secondary | ICD-10-CM | POA: Diagnosis not present

## 2019-09-09 DIAGNOSIS — Z436 Encounter for attention to other artificial openings of urinary tract: Secondary | ICD-10-CM | POA: Diagnosis not present

## 2019-09-09 DIAGNOSIS — Z87891 Personal history of nicotine dependence: Secondary | ICD-10-CM | POA: Diagnosis not present

## 2019-09-09 DIAGNOSIS — G301 Alzheimer's disease with late onset: Secondary | ICD-10-CM | POA: Diagnosis not present

## 2019-09-13 DIAGNOSIS — Z9181 History of falling: Secondary | ICD-10-CM | POA: Diagnosis not present

## 2019-09-13 DIAGNOSIS — R634 Abnormal weight loss: Secondary | ICD-10-CM | POA: Diagnosis not present

## 2019-09-13 DIAGNOSIS — F0281 Dementia in other diseases classified elsewhere with behavioral disturbance: Secondary | ICD-10-CM | POA: Diagnosis not present

## 2019-09-13 DIAGNOSIS — Z87891 Personal history of nicotine dependence: Secondary | ICD-10-CM | POA: Diagnosis not present

## 2019-09-13 DIAGNOSIS — Z436 Encounter for attention to other artificial openings of urinary tract: Secondary | ICD-10-CM | POA: Diagnosis not present

## 2019-09-13 DIAGNOSIS — G301 Alzheimer's disease with late onset: Secondary | ICD-10-CM | POA: Diagnosis not present

## 2019-09-14 DIAGNOSIS — F0281 Dementia in other diseases classified elsewhere with behavioral disturbance: Secondary | ICD-10-CM | POA: Diagnosis not present

## 2019-09-14 DIAGNOSIS — G301 Alzheimer's disease with late onset: Secondary | ICD-10-CM | POA: Diagnosis not present

## 2019-09-14 DIAGNOSIS — Z436 Encounter for attention to other artificial openings of urinary tract: Secondary | ICD-10-CM | POA: Diagnosis not present

## 2019-09-14 DIAGNOSIS — Z9181 History of falling: Secondary | ICD-10-CM | POA: Diagnosis not present

## 2019-09-14 DIAGNOSIS — Z87891 Personal history of nicotine dependence: Secondary | ICD-10-CM | POA: Diagnosis not present

## 2019-09-14 DIAGNOSIS — R634 Abnormal weight loss: Secondary | ICD-10-CM | POA: Diagnosis not present

## 2019-09-18 DIAGNOSIS — R634 Abnormal weight loss: Secondary | ICD-10-CM | POA: Diagnosis not present

## 2019-09-18 DIAGNOSIS — G301 Alzheimer's disease with late onset: Secondary | ICD-10-CM | POA: Diagnosis not present

## 2019-09-18 DIAGNOSIS — F0281 Dementia in other diseases classified elsewhere with behavioral disturbance: Secondary | ICD-10-CM | POA: Diagnosis not present

## 2019-09-18 DIAGNOSIS — Z436 Encounter for attention to other artificial openings of urinary tract: Secondary | ICD-10-CM | POA: Diagnosis not present

## 2019-09-18 DIAGNOSIS — Z87891 Personal history of nicotine dependence: Secondary | ICD-10-CM | POA: Diagnosis not present

## 2019-09-18 DIAGNOSIS — Z9181 History of falling: Secondary | ICD-10-CM | POA: Diagnosis not present

## 2019-09-23 DIAGNOSIS — L89610 Pressure ulcer of right heel, unstageable: Secondary | ICD-10-CM | POA: Diagnosis not present

## 2019-09-23 DIAGNOSIS — I5032 Chronic diastolic (congestive) heart failure: Secondary | ICD-10-CM | POA: Diagnosis not present

## 2019-09-23 DIAGNOSIS — Z87891 Personal history of nicotine dependence: Secondary | ICD-10-CM | POA: Diagnosis not present

## 2019-09-23 DIAGNOSIS — S82201D Unspecified fracture of shaft of right tibia, subsequent encounter for closed fracture with routine healing: Secondary | ICD-10-CM | POA: Diagnosis not present

## 2019-09-23 DIAGNOSIS — I482 Chronic atrial fibrillation, unspecified: Secondary | ICD-10-CM | POA: Diagnosis not present

## 2019-09-23 DIAGNOSIS — F0281 Dementia in other diseases classified elsewhere with behavioral disturbance: Secondary | ICD-10-CM | POA: Diagnosis not present

## 2019-09-23 DIAGNOSIS — Z9181 History of falling: Secondary | ICD-10-CM | POA: Diagnosis not present

## 2019-09-23 DIAGNOSIS — G301 Alzheimer's disease with late onset: Secondary | ICD-10-CM | POA: Diagnosis not present

## 2019-09-23 DIAGNOSIS — K5750 Diverticulosis of both small and large intestine without perforation or abscess without bleeding: Secondary | ICD-10-CM | POA: Diagnosis not present

## 2019-09-23 DIAGNOSIS — Z436 Encounter for attention to other artificial openings of urinary tract: Secondary | ICD-10-CM | POA: Diagnosis not present

## 2019-09-23 DIAGNOSIS — I110 Hypertensive heart disease with heart failure: Secondary | ICD-10-CM | POA: Diagnosis not present

## 2019-09-23 DIAGNOSIS — E119 Type 2 diabetes mellitus without complications: Secondary | ICD-10-CM | POA: Diagnosis not present

## 2019-09-23 DIAGNOSIS — R634 Abnormal weight loss: Secondary | ICD-10-CM | POA: Diagnosis not present

## 2019-09-24 DIAGNOSIS — F0281 Dementia in other diseases classified elsewhere with behavioral disturbance: Secondary | ICD-10-CM | POA: Diagnosis not present

## 2019-09-24 DIAGNOSIS — R634 Abnormal weight loss: Secondary | ICD-10-CM | POA: Diagnosis not present

## 2019-09-24 DIAGNOSIS — G301 Alzheimer's disease with late onset: Secondary | ICD-10-CM | POA: Diagnosis not present

## 2019-09-24 DIAGNOSIS — Z9181 History of falling: Secondary | ICD-10-CM | POA: Diagnosis not present

## 2019-09-24 DIAGNOSIS — Z436 Encounter for attention to other artificial openings of urinary tract: Secondary | ICD-10-CM | POA: Diagnosis not present

## 2019-09-24 DIAGNOSIS — Z87891 Personal history of nicotine dependence: Secondary | ICD-10-CM | POA: Diagnosis not present

## 2019-09-26 DIAGNOSIS — F0281 Dementia in other diseases classified elsewhere with behavioral disturbance: Secondary | ICD-10-CM | POA: Diagnosis not present

## 2019-09-26 DIAGNOSIS — Z436 Encounter for attention to other artificial openings of urinary tract: Secondary | ICD-10-CM | POA: Diagnosis not present

## 2019-09-26 DIAGNOSIS — Z9181 History of falling: Secondary | ICD-10-CM | POA: Diagnosis not present

## 2019-09-26 DIAGNOSIS — G301 Alzheimer's disease with late onset: Secondary | ICD-10-CM | POA: Diagnosis not present

## 2019-09-26 DIAGNOSIS — Z87891 Personal history of nicotine dependence: Secondary | ICD-10-CM | POA: Diagnosis not present

## 2019-09-26 DIAGNOSIS — R634 Abnormal weight loss: Secondary | ICD-10-CM | POA: Diagnosis not present

## 2019-09-27 DIAGNOSIS — Z436 Encounter for attention to other artificial openings of urinary tract: Secondary | ICD-10-CM | POA: Diagnosis not present

## 2019-09-27 DIAGNOSIS — R634 Abnormal weight loss: Secondary | ICD-10-CM | POA: Diagnosis not present

## 2019-09-27 DIAGNOSIS — Z87891 Personal history of nicotine dependence: Secondary | ICD-10-CM | POA: Diagnosis not present

## 2019-09-27 DIAGNOSIS — Z9181 History of falling: Secondary | ICD-10-CM | POA: Diagnosis not present

## 2019-09-27 DIAGNOSIS — G301 Alzheimer's disease with late onset: Secondary | ICD-10-CM | POA: Diagnosis not present

## 2019-09-27 DIAGNOSIS — F0281 Dementia in other diseases classified elsewhere with behavioral disturbance: Secondary | ICD-10-CM | POA: Diagnosis not present

## 2019-10-24 DEATH — deceased

## 2020-04-22 IMAGING — CT CT CERVICAL SPINE WITHOUT CONTRAST
3 of 9 series · 10 of 33 positions shown, 11 images · non-contrast
Comparison: CT scan of April 25, 2019.

CLINICAL DATA: Fall.

EXAM:
CT HEAD WITHOUT CONTRAST
CT CERVICAL SPINE WITHOUT CONTRAST
TECHNIQUE: Multidetector CT imaging of the head and cervical spine was
performed following the standard protocol without intravenous
contrast. Multiplanar CT image reconstructions of the cervical spine
were also generated.

[Series 11: c spine soft · axial · 0.32mm/px · z∈[-271,-163]mm · 4 of 90 slices shown]
[im 18/90  soft-tissue]
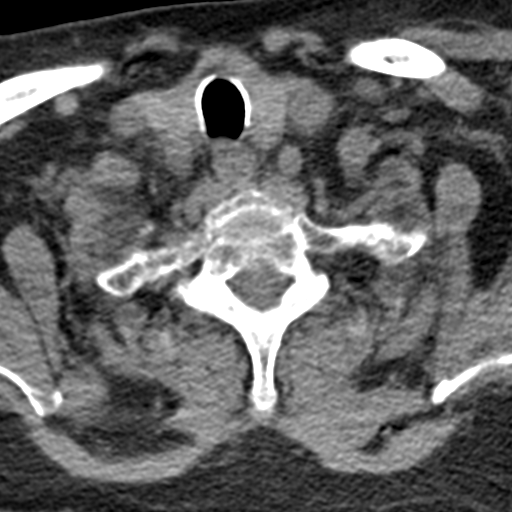
[im 36/90  soft-tissue]
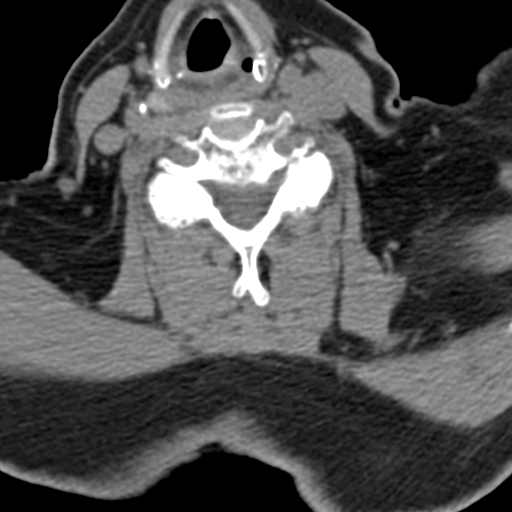
[im 54/90  soft-tissue]
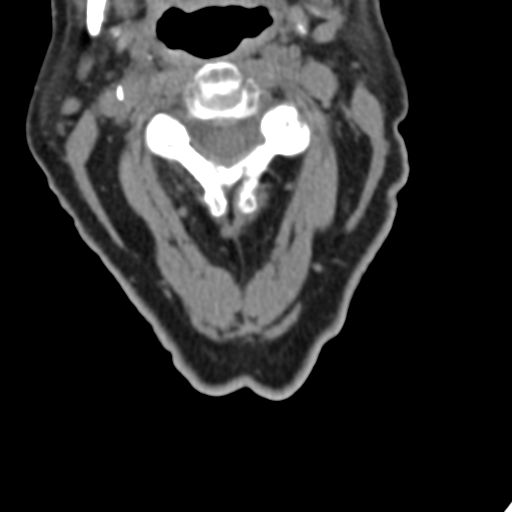
[im 72/90  soft-tissue]
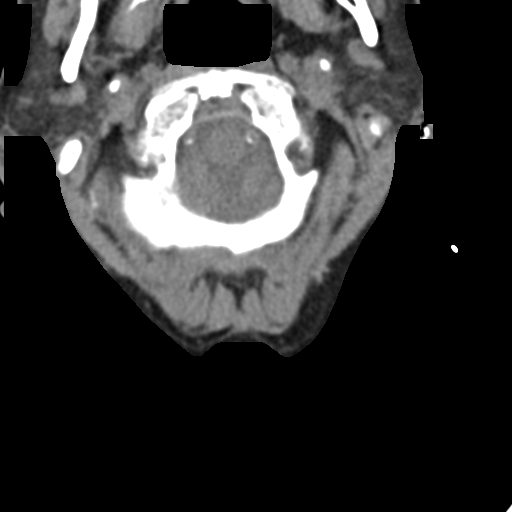

[Series 13: orthogonal bone · axial · 0.23mm/px · z∈[-295,-178]mm · 4 of 101 slices shown, 5 images]
[im 21/101  soft-tissue]
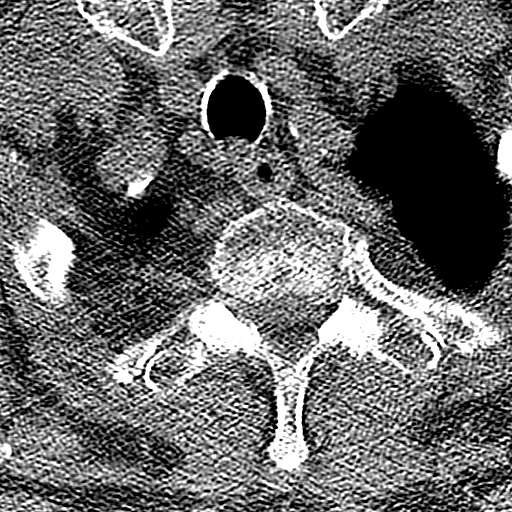
[im 21/101  bone]
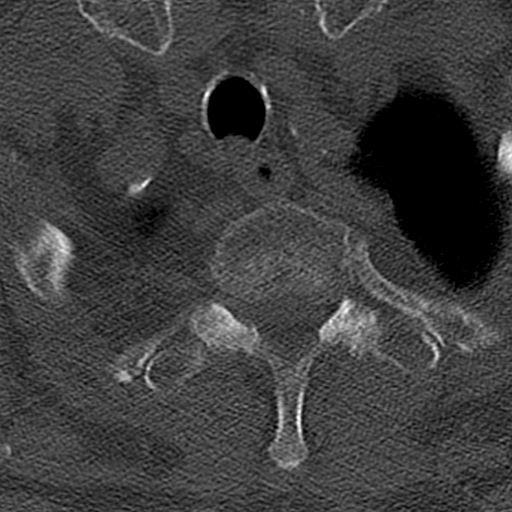
[im 41/101  bone]
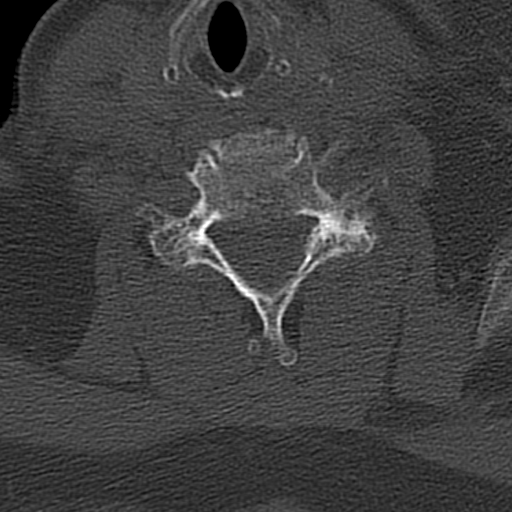
[im 61/101  bone]
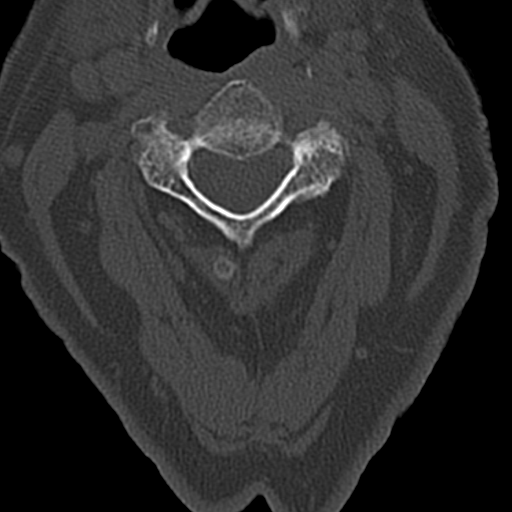
[im 81/101  bone]
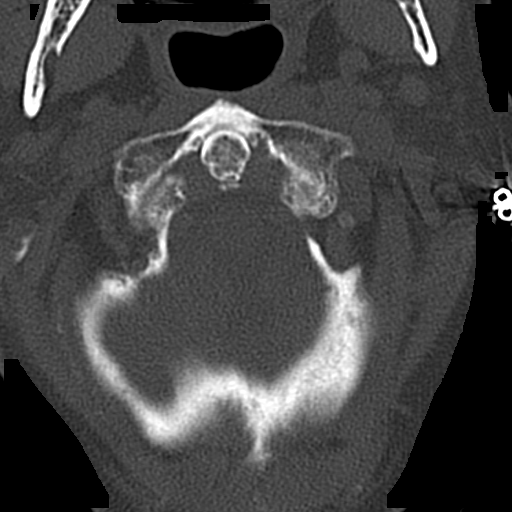

[Series 15: sagittal bone · sagittal · 0.27mm/px · 2 of 61 slices shown]
[im 21/61  bone]
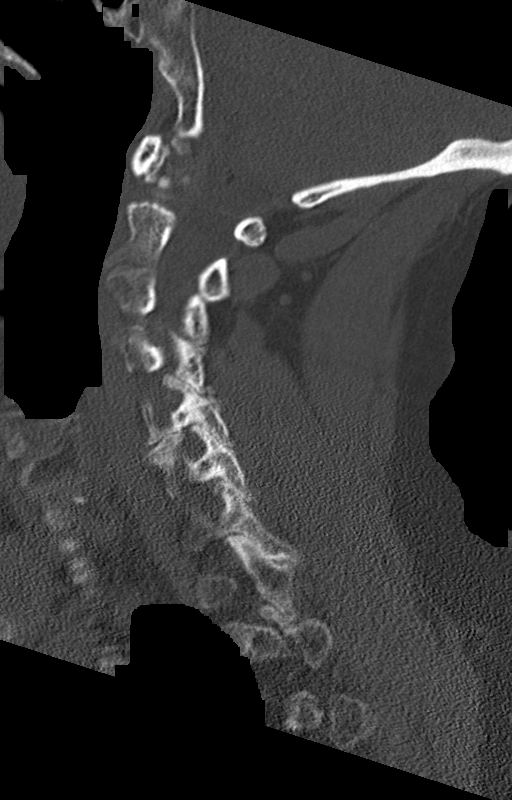
[im 41/61  bone]
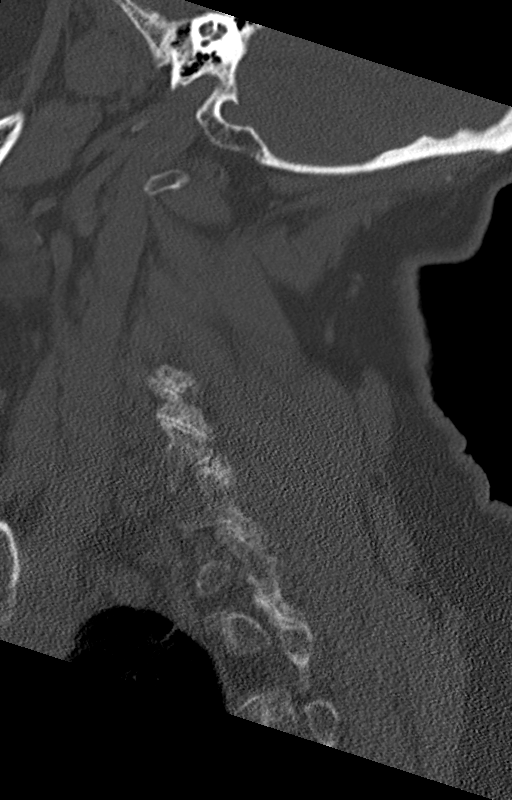

[10 of 33 positions shown; findings below may reference images not displayed]

FINDINGS: CT HEAD FINDINGS

Brain: Mild diffuse cortical atrophy is noted. Mild chronic ischemic
white matter disease is noted. No mass effect or midline shift is
noted. Ventricular size is within normal limits. There is no
evidence of mass lesion, hemorrhage or acute infarction.

Vascular: No hyperdense vessel or unexpected calcification.

Skull: Normal. Negative for fracture or focal lesion.

Sinuses/Orbits: No acute finding.

Other: None.

CT CERVICAL SPINE FINDINGS

Alignment: Stable grade 1 anterolisthesis of C4-5 is noted secondary
to posterior facet joint hypertrophy. Pole C7 fracture is noted. No
acute fracture is noted.

Skull base and vertebrae: No acute fracture. No primary bone lesion
or focal pathologic process.

Soft tissues and spinal canal: No prevertebral fluid or swelling. No
visible canal hematoma.

Disc levels:  Severe degenerative disc disease is noted at C5-6.

Upper chest: Negative.

Other: Degenerative changes are seen involving posterior facet
joints bilaterally.
IMPRESSION: Mild diffuse cortical atrophy. Mild chronic ischemic white matter
disease. No acute intracranial abnormality seen.

Stable degenerative changes are noted in cervical spine as described
above. No acute abnormality is noted.

## 2020-05-20 IMAGING — US US RENAL
1 series · 14 of 25 positions shown · non-contrast
Comparison: None.

CLINICAL DATA: Acute on chronic renal failure

EXAM:
RENAL / URINARY TRACT ULTRASOUND COMPLETE

[Series 1: us renal · 14 of 72 slices shown]
[im 1/72]
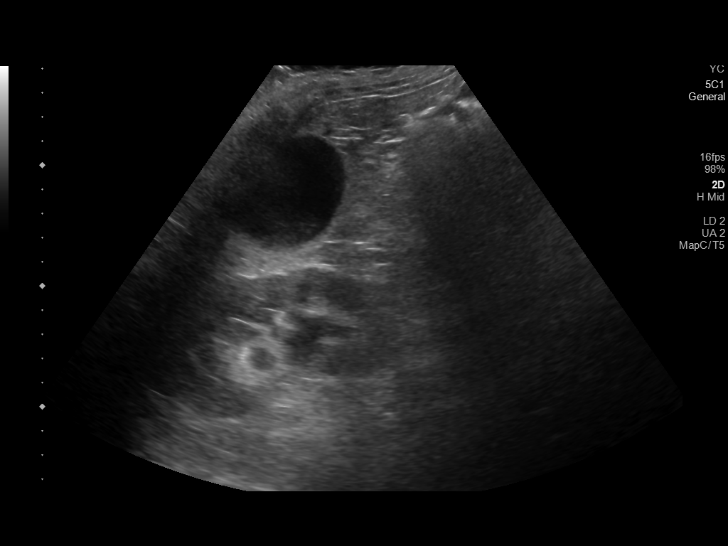
[im 6/72]
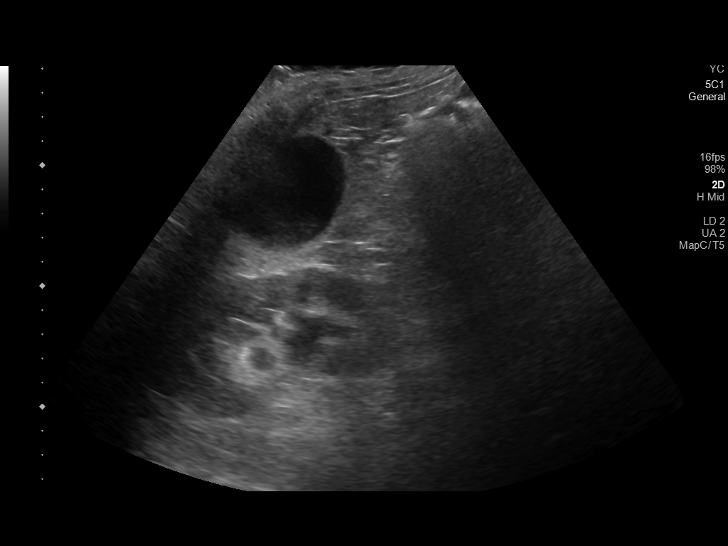
[im 12/72]
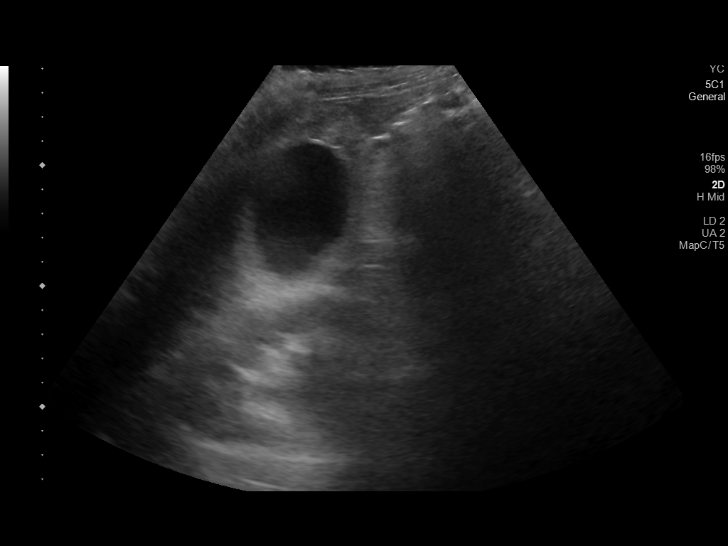
[im 18/72]
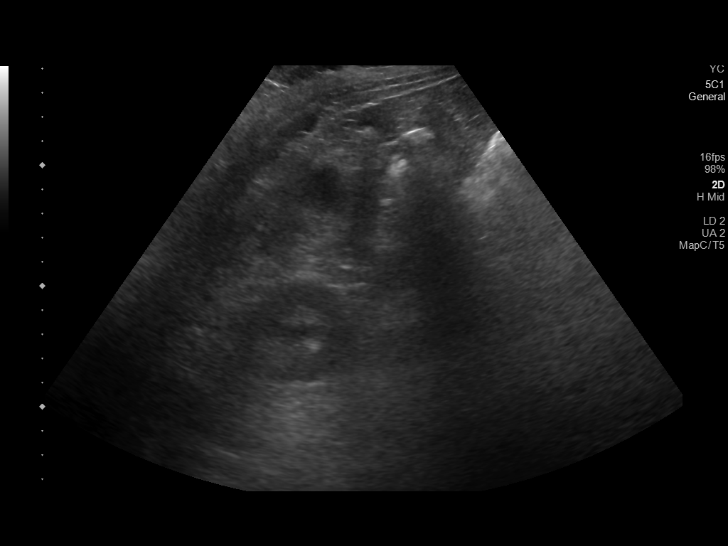
[im 24/72]
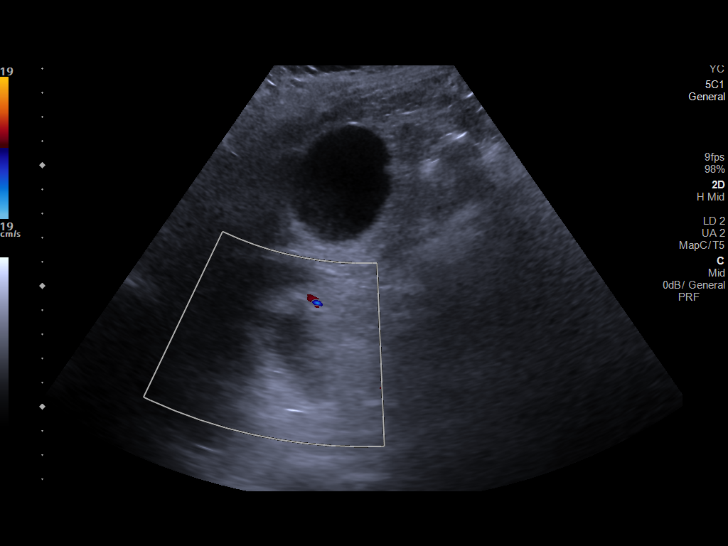
[im 27/72]
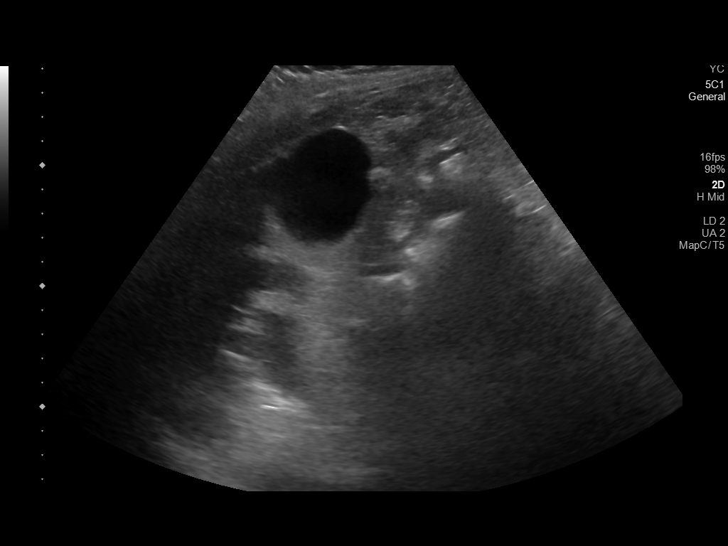
[im 33/72]
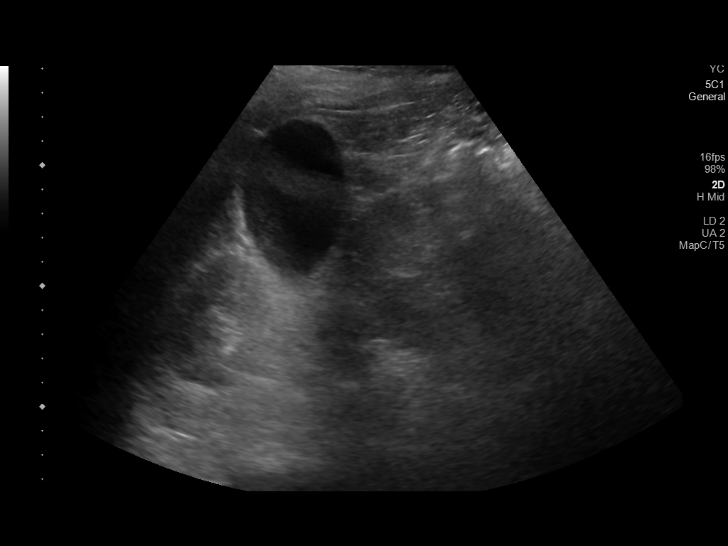
[im 39/72]
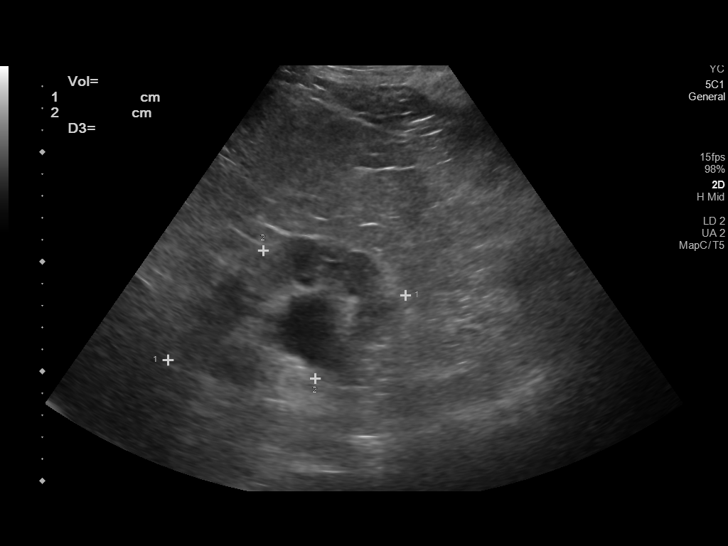
[im 45/72]
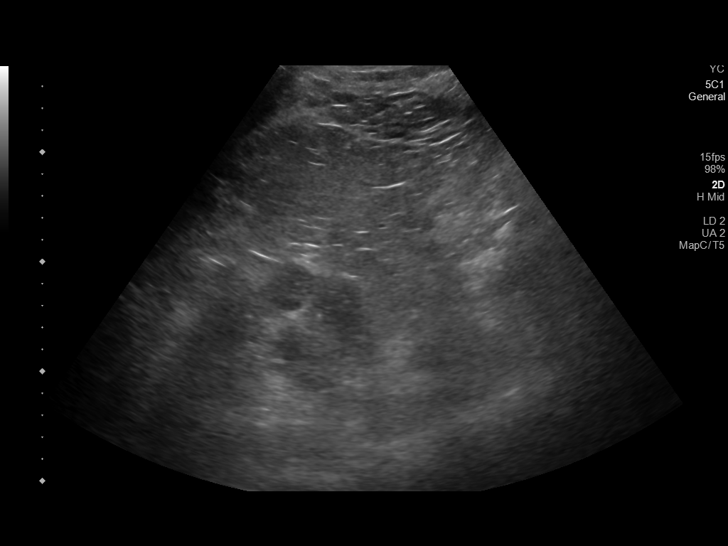
[im 48/72]
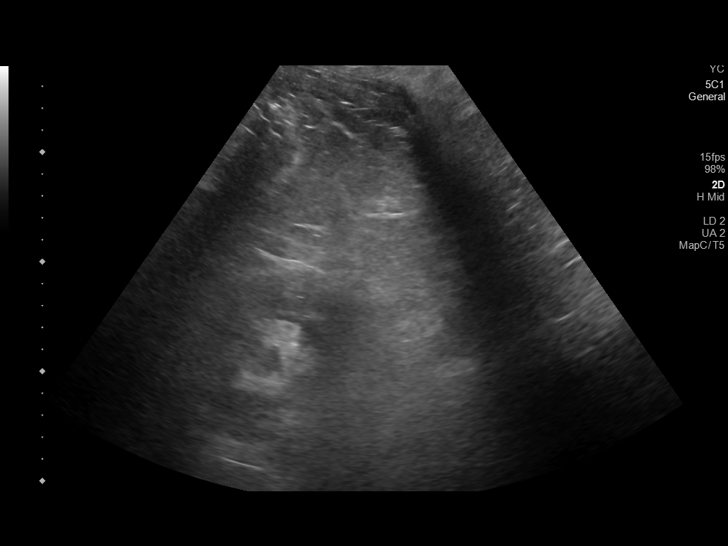
[im 54/72]
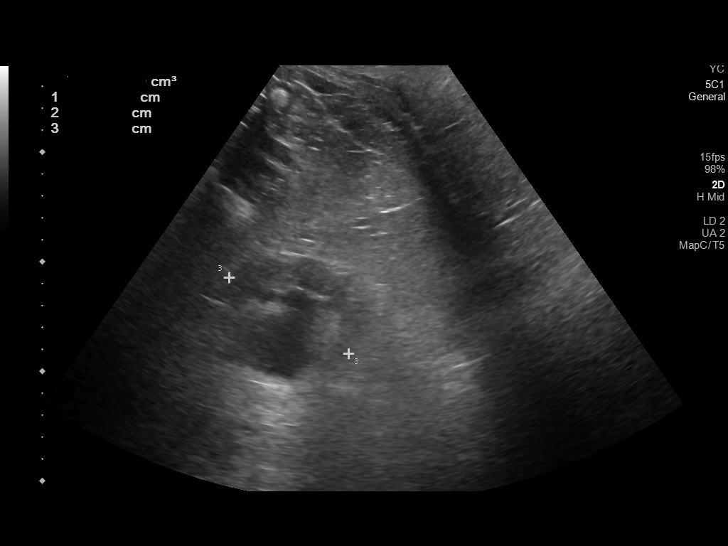
[im 60/72]
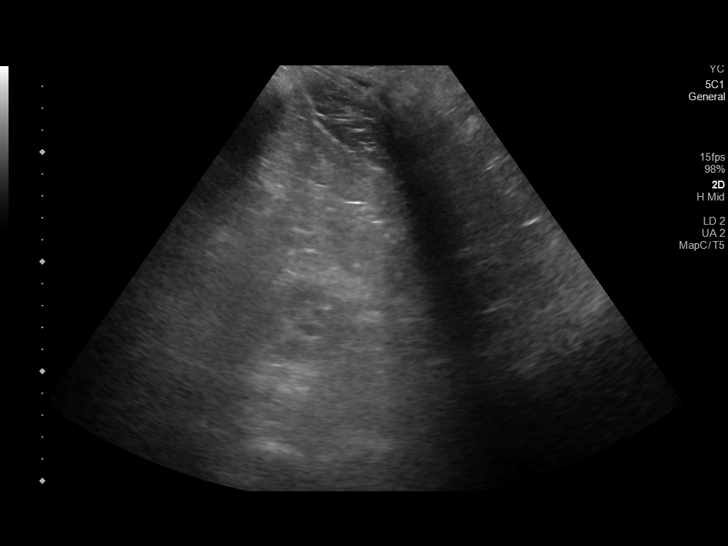
[im 66/72]
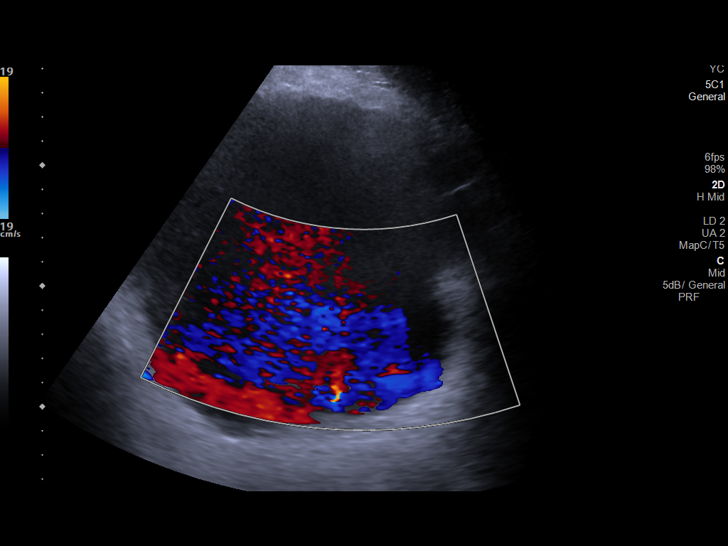
[im 72/72]
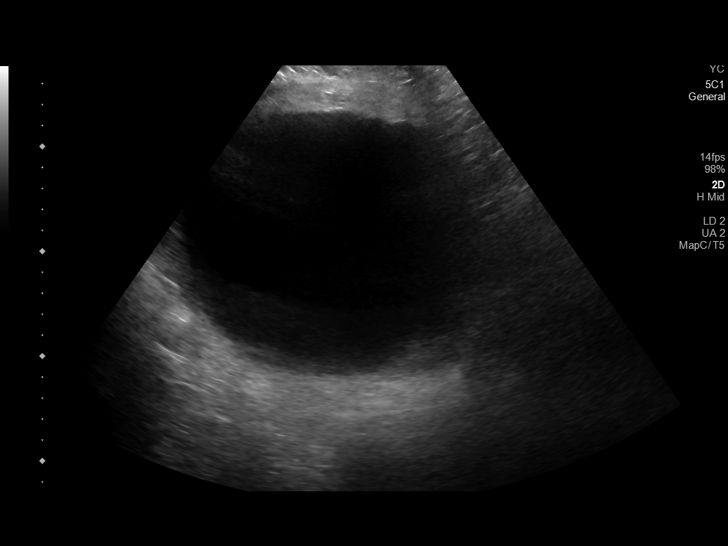

[14 of 25 positions shown; findings below may reference images not displayed]

FINDINGS: Right Kidney:

Renal measurements: 11.4 x 5.0 x 5.8 cm = volume: 173 mL. Poorly
visualized. Echogenicity within normal limits. Moderate
hydronephrosis.

Left Kidney:

Renal measurements: 11.2 x 6.3 x 6.5 cm = volume: 239 mL. Poorly
visualized. Echogenicity within normal limits. Moderate
hydronephrosis.

Bladder:

Within normal limits.  Bilateral bladder jets are visualized.
IMPRESSION: Moderate hydronephrosis bilaterally. Correlate for neurogenic
bladder/bladder outlet obstruction.

## 2020-10-23 DEATH — deceased

## 2021-10-23 DEATH — deceased
